# Patient Record
Sex: Female | Born: 1935 | Race: White | Hispanic: No | Smoking: Former smoker
Health system: Southern US, Community
[De-identification: ages and names within clinical notes are randomized; demographics above are authoritative.]

## PROBLEM LIST (undated history)

## (undated) DIAGNOSIS — E785 Hyperlipidemia, unspecified: Secondary | ICD-10-CM

## (undated) DIAGNOSIS — R519 Headache, unspecified: Secondary | ICD-10-CM

## (undated) DIAGNOSIS — Z83719 Family history of colon polyps, unspecified: Secondary | ICD-10-CM

## (undated) DIAGNOSIS — N200 Calculus of kidney: Secondary | ICD-10-CM

## (undated) DIAGNOSIS — B019 Varicella without complication: Secondary | ICD-10-CM

## (undated) DIAGNOSIS — K219 Gastro-esophageal reflux disease without esophagitis: Secondary | ICD-10-CM

## (undated) DIAGNOSIS — R51 Headache: Secondary | ICD-10-CM

## (undated) DIAGNOSIS — M4846XA Fatigue fracture of vertebra, lumbar region, initial encounter for fracture: Secondary | ICD-10-CM

## (undated) DIAGNOSIS — T7840XA Allergy, unspecified, initial encounter: Secondary | ICD-10-CM

## (undated) DIAGNOSIS — R002 Palpitations: Secondary | ICD-10-CM

## (undated) DIAGNOSIS — F419 Anxiety disorder, unspecified: Secondary | ICD-10-CM

## (undated) DIAGNOSIS — R011 Cardiac murmur, unspecified: Secondary | ICD-10-CM

## (undated) DIAGNOSIS — F329 Major depressive disorder, single episode, unspecified: Secondary | ICD-10-CM

## (undated) DIAGNOSIS — M1990 Unspecified osteoarthritis, unspecified site: Secondary | ICD-10-CM

## (undated) DIAGNOSIS — I10 Essential (primary) hypertension: Secondary | ICD-10-CM

## (undated) DIAGNOSIS — F32A Depression, unspecified: Secondary | ICD-10-CM

## (undated) DIAGNOSIS — B029 Zoster without complications: Secondary | ICD-10-CM

## (undated) DIAGNOSIS — Z8371 Family history of colonic polyps: Secondary | ICD-10-CM

## (undated) DIAGNOSIS — Z87442 Personal history of urinary calculi: Secondary | ICD-10-CM

## (undated) HISTORY — DX: Major depressive disorder, single episode, unspecified: F32.9

## (undated) HISTORY — DX: Varicella without complication: B01.9

## (undated) HISTORY — PX: BREAST SURGERY: SHX581

## (undated) HISTORY — DX: Family history of colonic polyps: Z83.71

## (undated) HISTORY — DX: Headache, unspecified: R51.9

## (undated) HISTORY — DX: Hyperlipidemia, unspecified: E78.5

## (undated) HISTORY — DX: Unspecified osteoarthritis, unspecified site: M19.90

## (undated) HISTORY — PX: EYE SURGERY: SHX253

## (undated) HISTORY — DX: Gastro-esophageal reflux disease without esophagitis: K21.9

## (undated) HISTORY — DX: Cardiac murmur, unspecified: R01.1

## (undated) HISTORY — DX: Zoster without complications: B02.9

## (undated) HISTORY — DX: Calculus of kidney: N20.0

## (undated) HISTORY — DX: Palpitations: R00.2

## (undated) HISTORY — DX: Essential (primary) hypertension: I10

## (undated) HISTORY — DX: Family history of colon polyps, unspecified: Z83.719

## (undated) HISTORY — PX: RHINOPLASTY: SHX2354

## (undated) HISTORY — DX: Depression, unspecified: F32.A

## (undated) HISTORY — DX: Headache: R51

## (undated) HISTORY — DX: Allergy, unspecified, initial encounter: T78.40XA

---

## 1999-08-12 HISTORY — PX: BREAST CYST ASPIRATION: SHX578

## 1999-08-12 HISTORY — PX: OTHER SURGICAL HISTORY: SHX169

## 2004-05-16 ENCOUNTER — Ambulatory Visit: Payer: Self-pay | Admitting: Internal Medicine

## 2004-10-14 ENCOUNTER — Ambulatory Visit: Payer: Self-pay | Admitting: Internal Medicine

## 2005-08-07 ENCOUNTER — Ambulatory Visit: Payer: Self-pay | Admitting: Internal Medicine

## 2005-08-11 HISTORY — PX: CATARACT EXTRACTION, BILATERAL: SHX1313

## 2005-10-02 ENCOUNTER — Ambulatory Visit: Payer: Self-pay | Admitting: Gastroenterology

## 2005-10-16 ENCOUNTER — Ambulatory Visit: Payer: Self-pay | Admitting: Gastroenterology

## 2005-11-19 ENCOUNTER — Ambulatory Visit: Payer: Self-pay | Admitting: Gastroenterology

## 2006-03-12 ENCOUNTER — Ambulatory Visit: Payer: Self-pay | Admitting: Internal Medicine

## 2006-06-09 ENCOUNTER — Ambulatory Visit: Payer: Self-pay | Admitting: Ophthalmology

## 2006-07-14 ENCOUNTER — Ambulatory Visit: Payer: Self-pay | Admitting: Ophthalmology

## 2010-08-11 DIAGNOSIS — B029 Zoster without complications: Secondary | ICD-10-CM

## 2010-08-11 HISTORY — DX: Zoster without complications: B02.9

## 2013-06-02 ENCOUNTER — Encounter: Payer: Self-pay | Admitting: Internal Medicine

## 2013-06-02 ENCOUNTER — Ambulatory Visit (INDEPENDENT_AMBULATORY_CARE_PROVIDER_SITE_OTHER): Payer: Medicare Other | Admitting: Family Medicine

## 2013-06-02 ENCOUNTER — Encounter: Payer: Self-pay | Admitting: Family Medicine

## 2013-06-02 VITALS — BP 120/74 | HR 73 | Temp 98.0°F | Ht 66.0 in | Wt 152.8 lb

## 2013-06-02 DIAGNOSIS — Z860100 Personal history of colon polyps, unspecified: Secondary | ICD-10-CM

## 2013-06-02 DIAGNOSIS — J309 Allergic rhinitis, unspecified: Secondary | ICD-10-CM

## 2013-06-02 DIAGNOSIS — R51 Headache: Secondary | ICD-10-CM

## 2013-06-02 DIAGNOSIS — R519 Headache, unspecified: Secondary | ICD-10-CM

## 2013-06-02 DIAGNOSIS — E7800 Pure hypercholesterolemia, unspecified: Secondary | ICD-10-CM

## 2013-06-02 DIAGNOSIS — IMO0002 Reserved for concepts with insufficient information to code with codable children: Secondary | ICD-10-CM

## 2013-06-02 DIAGNOSIS — I059 Rheumatic mitral valve disease, unspecified: Secondary | ICD-10-CM

## 2013-06-02 DIAGNOSIS — Z23 Encounter for immunization: Secondary | ICD-10-CM

## 2013-06-02 DIAGNOSIS — Z1231 Encounter for screening mammogram for malignant neoplasm of breast: Secondary | ICD-10-CM

## 2013-06-02 DIAGNOSIS — I10 Essential (primary) hypertension: Secondary | ICD-10-CM

## 2013-06-02 DIAGNOSIS — F325 Major depressive disorder, single episode, in full remission: Secondary | ICD-10-CM

## 2013-06-02 DIAGNOSIS — R002 Palpitations: Secondary | ICD-10-CM

## 2013-06-02 DIAGNOSIS — M810 Age-related osteoporosis without current pathological fracture: Secondary | ICD-10-CM

## 2013-06-02 DIAGNOSIS — I341 Nonrheumatic mitral (valve) prolapse: Secondary | ICD-10-CM

## 2013-06-02 DIAGNOSIS — K219 Gastro-esophageal reflux disease without esophagitis: Secondary | ICD-10-CM | POA: Insufficient documentation

## 2013-06-02 DIAGNOSIS — B0229 Other postherpetic nervous system involvement: Secondary | ICD-10-CM

## 2013-06-02 MED ORDER — AMLODIPINE BESYLATE 2.5 MG PO TABS: 2.5000 mg | ORAL_TABLET | ORAL | Status: DC

## 2013-06-02 NOTE — Assessment & Plan Note (Signed)
Due for repeat colonoscopy given high risk polyps.

## 2013-06-02 NOTE — Assessment & Plan Note (Signed)
Minimally bothering her.. ? Tension vs ice pick headaches.  No neuro changes.  if worsening will eval further.

## 2013-06-02 NOTE — Patient Instructions (Addendum)
Change amlodipine to 2.5 mg daily as opposed to 5 mg every other day.  Follow BP at home.  Goal < 140/90.  Stop at front desk to set up referral  For colonoscopy and mammogram.  Given flu vaccine today. Oush fluids.  Make appt if headaches are worsening.

## 2013-06-02 NOTE — Assessment & Plan Note (Signed)
Well controlled last check. 

## 2013-06-02 NOTE — Progress Notes (Signed)
Subjective:    Patient ID: Marilyn Sharp, female    DOB: [birth year], 77 y.o.   MRN: [id number]  HPI  77 year old female presents to establish.  She was previously living in [place]. Had MD there...  Dr. Lona Kettle Last CPX in May 2014. Had labs done at that time. See below.  She is overdue for mammogram and colonoscopy (  Right sided flat polyps on last one 08/2011..recommended repeat in 1-2 years.) DEXA: 2013 normal. Was on fosamax and boniva for several years hx of osteoporosis. DVE and PAP not indicated. No family history of uterine or ovarian cancer.   Hypertension: well controlled on losartan and amlodipine ( using every toher day given had lower BPs over summer)  Using medication without problems or lightheadedness:  None Chest pain with exertion:None Edema:None Short of breath:None Average home BPs: occ checking Other issues:  Elevated Cholesterol: Well controlled on no medication, does take fish oil. 01/2013: total  181, trig 131, HDL 67, LDL 88 Diet compliance: Exercise: Other complaints:  01/2013 Hga1c 5.8 TSH 1.250    She has new onset head pain ... On top part of head last few seconds.Marland Kitchenoccasionally happens 4-5 times a day, but only few days in a month.  She has not had any episodes in last week. No neuro changes, no vision change.  NO LOC, no fall , no head injury.    Review of Systems  Constitutional: Negative for fever, fatigue and unexpected weight change.  HENT: Negative for congestion, ear pain, sinus pressure, sneezing, sore throat and trouble swallowing.   Eyes: Negative for pain and itching.  Respiratory: Negative for cough, shortness of breath and wheezing.   Cardiovascular: Positive for palpitations. Negative for chest pain and leg swelling.       Out of breath with 3-4 flights of stair climbing stairs( or ore excessive exercise) at times, associated with fatigue and being out of breath.  Seem to improve the more she does it.  Occ palpitation when  she is fatigued.  Gastrointestinal: Negative for nausea, abdominal pain, diarrhea, constipation and blood in stool.  Genitourinary: Positive for urgency. Negative for dysuria, hematuria, vaginal discharge, difficulty urinating and menstrual problem.       Minimal urge incontinence  nocturia 2-3 times a night.  Musculoskeletal: Negative for back pain.  Skin: Negative for rash.  Neurological: Negative for syncope, weakness, light-headedness, numbness and headaches.  Psychiatric/Behavioral: Negative for confusion and dysphoric mood. The patient is not nervous/anxious.        Objective:   Physical Exam  Constitutional: Vital signs are normal. She appears well-developed and well-nourished. She is cooperative.  Non-toxic appearance. She does not appear ill. No distress.  HENT:  Head: Normocephalic.  Right Ear: Hearing, tympanic membrane, external ear and ear canal normal.  Left Ear: Hearing, tympanic membrane, external ear and ear canal normal.  Nose: Nose normal.  Eyes: Conjunctivae, EOM and lids are normal. Pupils are equal, round, and reactive to light. Lids are everted and swept, no foreign bodies found.  Neck: Trachea normal and normal range of motion. Neck supple. Carotid bruit is not present. No mass and no thyromegaly present.  Cardiovascular: Normal rate, regular rhythm, S1 normal, S2 normal, normal heart sounds and intact distal pulses.  Exam reveals no gallop.   No murmur heard. Pulmonary/Chest: Effort normal and breath sounds normal. No respiratory distress. She has no wheezes. She has no rhonchi. She has no rales.  Abdominal: Soft. Normal appearance and bowel sounds are normal. She  exhibits no distension, no fluid wave, no abdominal bruit and no mass. There is no hepatosplenomegaly. There is no tenderness. There is no rebound, no guarding and no CVA tenderness. No hernia.  Lymphadenopathy:    She has no cervical adenopathy.    She has no axillary adenopathy.  Neurological: She is  alert. She has normal strength. No cranial nerve deficit or sensory deficit.  Skin: Skin is warm, dry and intact. No rash noted.  Psychiatric: Her speech is normal and behavior is normal. Judgment normal. Her mood appears not anxious. Cognition and memory are normal. She does not exhibit a depressed mood.          Assessment & Plan:

## 2013-06-02 NOTE — Assessment & Plan Note (Signed)
Well controlled. Continue current medication. Encouraged exercise, weight loss, healthy eating habits.  

## 2013-06-21 ENCOUNTER — Ambulatory Visit: Payer: Self-pay | Admitting: Family Medicine

## 2013-06-22 ENCOUNTER — Encounter: Payer: Self-pay | Admitting: Family Medicine

## 2013-08-19 ENCOUNTER — Telehealth: Payer: Self-pay | Admitting: *Deleted

## 2013-08-19 ENCOUNTER — Ambulatory Visit (AMBULATORY_SURGERY_CENTER): Payer: Medicare Other | Admitting: *Deleted

## 2013-08-19 VITALS — Ht 66.0 in | Wt 156.6 lb

## 2013-08-19 DIAGNOSIS — Z86010 Personal history of colonic polyps: Secondary | ICD-10-CM

## 2013-08-19 MED ORDER — MOVIPREP 100 G PO SOLR: ORAL | Status: DC

## 2013-08-19 NOTE — Telephone Encounter (Signed)
Dr Hilarie Fredrickson: pt scheduled for colonoscopy Friday 1/23. Pt has had quite a few colonoscopies for hx colon polyps and does not have those reports.  Last colonoscopy 2013 in [place].  Report and pathology report are in EPIC under procedures.  Is pt due for recall colonoscopy now?  Thanks, Juliann Pulse

## 2013-08-19 NOTE — Progress Notes (Signed)
No allergies to eggs or soy. No problems with anesthesia.  

## 2013-08-22 NOTE — Telephone Encounter (Signed)
Yes, pt is due for repeat surveillance colonoscopy now She should be advised to keep her scheduled procedure for later this month Thanks  JMP

## 2013-08-22 NOTE — Telephone Encounter (Signed)
Patient was notified of Dr.Pyrtle's request to keep appointment. Patient verbally understands. Thanks,RM

## 2013-09-02 ENCOUNTER — Ambulatory Visit (AMBULATORY_SURGERY_CENTER): Payer: Medicare Other | Admitting: Internal Medicine

## 2013-09-02 ENCOUNTER — Encounter: Payer: Self-pay | Admitting: Internal Medicine

## 2013-09-02 ENCOUNTER — Encounter: Payer: Medicare Other | Admitting: Internal Medicine

## 2013-09-02 VITALS — BP 139/84 | HR 67 | Temp 97.1°F | Resp 27 | Ht 66.0 in | Wt 156.0 lb

## 2013-09-02 DIAGNOSIS — Z86010 Personal history of colonic polyps: Secondary | ICD-10-CM

## 2013-09-02 DIAGNOSIS — D126 Benign neoplasm of colon, unspecified: Secondary | ICD-10-CM

## 2013-09-02 MED ORDER — SODIUM CHLORIDE 0.9 % IV SOLN: 500.0000 mL | INTRAVENOUS | Status: DC

## 2013-09-02 NOTE — Op Note (Signed)
[facility]  [address] [place], [address]   COLONOSCOPY PROCEDURE REPORT  PATIENT: Marilyn, Sharp  MR#: [id number] BIRTHDATE: 1935-12-12 , 77  yrs. old GENDER: Female ENDOSCOPIST: Jerene Bears, MD REFERRED BY:Amy Cletis Athens, M.D. PROCEDURE DATE:  09/02/2013 PROCEDURE:   Colonoscopy with snare polypectomy First Screening Colonoscopy - Avg.  risk and is 50 yrs.  old or older - No.  Prior Negative Screening - Now for repeat screening. N/A  History of Adenoma - Now for follow-up colonoscopy & has been > or = to 3 yrs.  No.  It has been less than 3 yrs since last colonoscopy.  Medical reason.  Polyps Removed Today? Yes. ASA CLASS:   Class III INDICATIONS:Patient's personal history of adenomatous colon polyps.  MEDICATIONS: MAC sedation, administered by CRNA and Propofol (Diprivan) 230 mg IV  DESCRIPTION OF PROCEDURE:   After the risks benefits and alternatives of the procedure were thoroughly explained, informed consent was obtained.  A digital rectal exam revealed no rectal mass.   The LB [id number]-H190 [id number]  endoscope was introduced through the anus and advanced to the cecum, which was identified by both the appendix and ileocecal valve. No adverse events experienced. The quality of the prep was good, using MoviPrep  The instrument was then slowly withdrawn as the colon was fully examined.  COLON FINDINGS: A flat polyp measuring 8-10 mm in size with a mucous cap was found in the transverse colon.  A polypectomy was performed with a cold snare and with cold forceps.  The resection was complete and the polyp tissue was completely retrieved.   A sessile polyp measuring 6-7 mm in size was found in the sigmoid colon.  A polypectomy was performed with a cold snare.  The resection was complete and the polyp tissue was completely retrieved.   Mild diverticulosis was noted The finding was in the left colon. Retroflexed views revealed small external hemorrhoids. The time  to cecum=3 minutes 40 seconds.  Withdrawal time=19 minutes 55 seconds. The scope was withdrawn and the procedure completed.  COMPLICATIONS: There were no complications.  ENDOSCOPIC IMPRESSION: 1.   Flat polyp measuring 8-10 mm in size was found in the transverse colon; polypectomy was performed with a cold snare and with cold forceps 2.   Sessile polyp measuring 6-7 mm in size was found in the sigmoid colon; polypectomy was performed with a cold snare 3.   Mild diverticulosis was noted in the left colon  RECOMMENDATIONS: 1.  Hold aspirin, aspirin products, and anti-inflammatory medication for 1 week. 2.  Await pathology results 3.  High fiber diet 4.  Timing of repeat colonoscopy will be determined by pathology findings. 5.  You will receive a letter within 1-2 weeks with the results of your biopsy as well as final recommendations.  Please call my office if you have not received a letter after 3 weeks.   eSigned:  Jerene Bears, MD 09/02/2013 9:58 AM   cc: The Patient and Jinny Sanders, MD   PATIENT NAMEOrean, Sharp MR#: [id number]

## 2013-09-02 NOTE — Patient Instructions (Addendum)

## 2013-09-02 NOTE — Progress Notes (Signed)
Procedure ends, to recovery, report given and VSS. 

## 2013-09-02 NOTE — Progress Notes (Signed)
Called to room to assist during endoscopic procedure.  Patient ID and intended procedure confirmed with present staff. Received instructions for my participation in the procedure from the performing physician.  

## 2013-09-05 ENCOUNTER — Telehealth: Payer: Self-pay | Admitting: *Deleted

## 2013-09-05 NOTE — Telephone Encounter (Signed)
  Follow up Call-  Call back number 09/02/2013  Post procedure Call Back phone  # [phone]  Permission to leave phone message Yes     Patient questions:  Do you have a fever, pain , or abdominal swelling? no Pain Score  0 *  Have you tolerated food without any problems? yes  Have you been able to return to your normal activities? yes  Do you have any questions about your discharge instructions: Diet   no Medications  no Follow up visit  no  Do you have questions or concerns about your Care? no  Actions: * If pain score is 4 or above: No action needed, pain <4.

## 2013-09-06 ENCOUNTER — Encounter: Payer: Self-pay | Admitting: Internal Medicine

## 2013-10-13 ENCOUNTER — Encounter: Payer: Self-pay | Admitting: Family Medicine

## 2013-10-13 ENCOUNTER — Ambulatory Visit (INDEPENDENT_AMBULATORY_CARE_PROVIDER_SITE_OTHER): Payer: Medicare Other | Admitting: Family Medicine

## 2013-10-13 VITALS — BP 130/78 | HR 74 | Temp 98.6°F | Wt 154.0 lb

## 2013-10-13 DIAGNOSIS — H5710 Ocular pain, unspecified eye: Secondary | ICD-10-CM

## 2013-10-13 DIAGNOSIS — M25519 Pain in unspecified shoulder: Secondary | ICD-10-CM

## 2013-10-13 DIAGNOSIS — H5711 Ocular pain, right eye: Secondary | ICD-10-CM

## 2013-10-13 DIAGNOSIS — M25511 Pain in right shoulder: Secondary | ICD-10-CM

## 2013-10-13 NOTE — Assessment & Plan Note (Signed)
Resolved now. ? Secondary to eyelid issue.  Nml neuro exam. Pt reassured, continue to follow.

## 2013-10-13 NOTE — Assessment & Plan Note (Signed)
Not consistent with shingles pain. Mre consistent with Rotator cuff issue. Treat with NSAIDs, ROM exercises.  Follow up in 2 weeks if not better.

## 2013-10-13 NOTE — Patient Instructions (Signed)
Start meloxicam for pain and inflammation in right shoulder, start daily for 3-4 days then go to as needed. Start home PT exercise per handout. Follow up in 2 weeks. Call if symptoms changing or worsening.

## 2013-10-13 NOTE — Progress Notes (Signed)
Pre visit review using our clinic review tool, if applicable. No additional management support is needed unless otherwise documented below in the visit note. 

## 2013-10-13 NOTE — Progress Notes (Signed)
Subjective:    Patient ID: Marilyn Sharp, female    DOB: 11/28/1935, 78 y.o.   MRN: [id number]  Shoulder Pain  Pertinent negatives include no fever.    78 year old female presents with  new onset  right shoulder and chest wall pain.  Pain off and on since 08/2013.Pain is laterally across outer shoulder but  Radiates down arm some and below arm on chest wall. Pain with reaching across chest  and raising arm arm up.  ASA helps minimally with pain.  Occ slight rash noted, itchy.   No known injury or fall.  2 days ago noted pain in right eye ( when pressed) and  Slightly droopy lid... Resolved after 48 hours.  No rash, eyelid changes. No URI symptoms.  No weakness, no slurred speech.    She has had shingles several times in the last 5 years on right arm. Never had blisters.   She has had shingles shot.  Review of Systems  Constitutional: Negative for fever and fatigue.  HENT: Negative for ear pain.   Eyes: Positive for pain. Negative for photophobia, discharge, redness, itching and visual disturbance.  Respiratory: Negative for cough and shortness of breath.   Cardiovascular: Negative for chest pain, palpitations and leg swelling.  Gastrointestinal: Negative for abdominal pain.       Objective:   Physical Exam  Constitutional: She is oriented to person, place, and time. Vital signs are normal. She appears well-developed and well-nourished. She is cooperative.  Non-toxic appearance. She does not appear ill. No distress.  HENT:  Head: Normocephalic.  Right Ear: Hearing, tympanic membrane, external ear and ear canal normal. Tympanic membrane is not erythematous, not retracted and not bulging.  Left Ear: Hearing, tympanic membrane, external ear and ear canal normal. Tympanic membrane is not erythematous, not retracted and not bulging.  Nose: No mucosal edema or rhinorrhea. Right sinus exhibits no maxillary sinus tenderness and no frontal sinus tenderness. Left sinus exhibits no  maxillary sinus tenderness and no frontal sinus tenderness.  Mouth/Throat: Uvula is midline, oropharynx is clear and moist and mucous membranes are normal.  Eyes: Conjunctivae, EOM and lids are normal. Pupils are equal, round, and reactive to light. Lids are everted and swept, no foreign bodies found.  Neck: Trachea normal and normal range of motion. Neck supple. Carotid bruit is not present. No mass and no thyromegaly present.  Cardiovascular: Normal rate, regular rhythm, S1 normal, S2 normal, normal heart sounds, intact distal pulses and normal pulses.  Exam reveals no gallop and no friction rub.   No murmur heard. Pulmonary/Chest: Effort normal and breath sounds normal. Not tachypneic. No respiratory distress. She has no decreased breath sounds. She has no wheezes. She has no rhonchi. She has no rales.  Abdominal: Soft. Normal appearance and bowel sounds are normal. There is no tenderness.  Musculoskeletal:       Right shoulder: She exhibits decreased range of motion and tenderness. She exhibits no bony tenderness, no swelling, no deformity, no laceration, no pain, no spasm and normal strength.  Pain with abduction, neg drop arm Neg Neer's   Neurological: She is alert and oriented to person, place, and time. She has normal strength and normal reflexes. No cranial nerve deficit or sensory deficit. She exhibits normal muscle tone. She displays a negative Romberg sign. Coordination and gait normal. GCS eye subscore is 4. GCS verbal subscore is 5. GCS motor subscore is 6.  Nml cerebellar exam   No papilledema  Skin: Skin is  warm, dry and intact. No rash noted.  Psychiatric: She has a normal mood and affect. Her speech is normal and behavior is normal. Judgment and thought content normal. Her mood appears not anxious. Cognition and memory are normal. Cognition and memory are not impaired. She does not exhibit a depressed mood. She exhibits normal recent memory and normal remote memory.            Assessment & Plan:

## 2013-10-14 ENCOUNTER — Telehealth: Payer: Self-pay

## 2013-10-14 MED ORDER — MELOXICAM 15 MG PO TABS: 15.0000 mg | ORAL_TABLET | Freq: Every day | ORAL | Status: DC

## 2013-10-14 NOTE — Telephone Encounter (Signed)
Pt was seen 10/13/13  Meloxicam is not at [facility]; pt request cb when Meloxicam sent to pharmacy.Please advise.

## 2013-10-14 NOTE — Telephone Encounter (Signed)
Please let pt know Rx has been sent in. Apologize for my error please. Thanks.

## 2013-10-14 NOTE — Telephone Encounter (Signed)
Gregary Signs notified prescription has been sent to her pharmacy.

## 2013-10-25 ENCOUNTER — Encounter: Payer: Self-pay | Admitting: Family Medicine

## 2013-10-25 ENCOUNTER — Ambulatory Visit (INDEPENDENT_AMBULATORY_CARE_PROVIDER_SITE_OTHER)
Admission: RE | Admit: 2013-10-25 | Discharge: 2013-10-25 | Disposition: A | Discharge status: Home / Self Care | Payer: Medicare Other | Source: Ambulatory Visit | Attending: Family Medicine | Admitting: Family Medicine

## 2013-10-25 ENCOUNTER — Ambulatory Visit (INDEPENDENT_AMBULATORY_CARE_PROVIDER_SITE_OTHER): Payer: Medicare Other | Admitting: Family Medicine

## 2013-10-25 VITALS — BP 130/80 | HR 74 | Temp 97.8°F | Ht 66.0 in | Wt 154.2 lb

## 2013-10-25 DIAGNOSIS — M25519 Pain in unspecified shoulder: Secondary | ICD-10-CM

## 2013-10-25 DIAGNOSIS — M25511 Pain in right shoulder: Secondary | ICD-10-CM

## 2013-10-25 NOTE — Progress Notes (Signed)
Subjective:    Patient ID: Marilyn Sharp, female    DOB: April 30, 1936, 78 y.o.   MRN: [id number]  HPI 78 year old female presents for follow up right shoulder pain. At last OV.. Most consistent with Rotator cuff issue. Started on meloxicam daily prn pain.   Today she reports she continues to have pain of and on.  Pain is not as severe as it was.  Pain in lateral arm and occ runs to wrist. No numbness.  Pain with lifting arm up and over to left side. She feels she is weaker in right hand.. Cannot open paint tubes.  No new neck pain . No pain with moving neck.  No further eye symptoms.  No known injury.    Review of Systems  Constitutional: Negative for chills and fatigue.  HENT: Negative for ear pain.   Eyes: Negative for pain.  Respiratory: Negative for cough and shortness of breath.   Cardiovascular: Negative for chest pain.       Objective:   Physical Exam  Constitutional: She is oriented to person, place, and time. Vital signs are normal. She appears well-developed and well-nourished. She is cooperative.  Non-toxic appearance. She does not appear ill. No distress.  HENT:  Head: Normocephalic.  Right Ear: Hearing, tympanic membrane, external ear and ear canal normal. Tympanic membrane is not erythematous, not retracted and not bulging.  Left Ear: Hearing, tympanic membrane, external ear and ear canal normal. Tympanic membrane is not erythematous, not retracted and not bulging.  Nose: No mucosal edema or rhinorrhea. Right sinus exhibits no maxillary sinus tenderness and no frontal sinus tenderness. Left sinus exhibits no maxillary sinus tenderness and no frontal sinus tenderness.  Mouth/Throat: Uvula is midline, oropharynx is clear and moist and mucous membranes are normal.  Eyes: Conjunctivae, EOM and lids are normal. Pupils are equal, round, and reactive to light. Lids are everted and swept, no foreign bodies found.  Neck: Trachea normal and normal range of motion. Neck  supple. Carotid bruit is not present. No mass and no thyromegaly present.  Cardiovascular: Normal rate, regular rhythm, S1 normal, S2 normal, normal heart sounds, intact distal pulses and normal pulses.  Exam reveals no gallop and no friction rub.   No murmur heard. Pulmonary/Chest: Effort normal and breath sounds normal. Not tachypneic. No respiratory distress. She has no decreased breath sounds. She has no wheezes. She has no rhonchi. She has no rales.  Abdominal: Soft. Normal appearance and bowel sounds are normal. There is no tenderness.  Musculoskeletal:       Right shoulder: She exhibits decreased range of motion and tenderness. She exhibits no bony tenderness, no swelling, no deformity, no laceration, no pain, no spasm and normal strength.  Slight right sided atrophy in right thenar prominence. Pain with crossing arm over chest Neg drop arm Neg Neer's No pain with int and ext rotation ttp over ant and posterior sub acromial fossa.   Neurological: She is alert and oriented to person, place, and time. She has normal strength and normal reflexes. No cranial nerve deficit or sensory deficit. She exhibits normal muscle tone. She displays a negative Romberg sign. Coordination and gait normal. GCS eye subscore is 4. GCS verbal subscore is 5. GCS motor subscore is 6.  Nml cerebellar exam   No papilledema  Skin: Skin is warm, dry and intact. No rash noted.  Psychiatric: She has a normal mood and affect. Her speech is normal and behavior is normal. Judgment and thought content normal. Her  mood appears not anxious. Cognition and memory are normal. Cognition and memory are not impaired. She does not exhibit a depressed mood. She exhibits normal recent memory and normal remote memory.          Assessment & Plan:

## 2013-10-25 NOTE — Progress Notes (Signed)
Pre visit review using our clinic review tool, if applicable. No additional management support is needed unless otherwise documented below in the visit note. 

## 2013-10-25 NOTE — Assessment & Plan Note (Signed)
Pain has some qualities of rotator cuff but also concerning for cervical neck pathology given weakness in right hand, possible atrophy and radiation to right hand.  Will eval neck and shoulder with X-rays.  USe meloxicam prn.

## 2013-10-25 NOTE — Patient Instructions (Signed)
We will call with X-ray results.  Use meloxicam as needed for pain.

## 2013-11-23 ENCOUNTER — Telehealth: Payer: Self-pay | Admitting: Family Medicine

## 2013-11-23 DIAGNOSIS — M810 Age-related osteoporosis without current pathological fracture: Secondary | ICD-10-CM

## 2013-11-23 DIAGNOSIS — E7800 Pure hypercholesterolemia, unspecified: Secondary | ICD-10-CM

## 2013-11-23 NOTE — Telephone Encounter (Signed)
Message copied by Jinny Sanders on Wed Nov 23, 2013 11:56 PM ------      Message from: Ellamae Sia      Created: Thu Nov 10, 2013 12:26 PM      Regarding: Lab orders for Thursday, 4.16.15       Patient is scheduled for CPX labs, please order future labs, Thanks , Terri       ------

## 2013-11-24 ENCOUNTER — Other Ambulatory Visit (INDEPENDENT_AMBULATORY_CARE_PROVIDER_SITE_OTHER): Payer: Medicare Other

## 2013-11-24 DIAGNOSIS — M810 Age-related osteoporosis without current pathological fracture: Secondary | ICD-10-CM

## 2013-11-24 DIAGNOSIS — E7800 Pure hypercholesterolemia, unspecified: Secondary | ICD-10-CM

## 2013-11-24 LAB — LIPID PANEL
CHOLESTEROL: 183 mg/dL (ref 0–200)
HDL: 56.8 mg/dL (ref >39.00)
LDL Cholesterol: 108 mg/dL — ABNORMAL HIGH (ref 0–99)
Total CHOL/HDL Ratio: 3
Triglycerides: 93 mg/dL (ref 0.0–149.0)
VLDL: 18.6 mg/dL (ref 0.0–40.0)

## 2013-11-24 LAB — COMPREHENSIVE METABOLIC PANEL
ALT: 11 U/L (ref 0–35)
AST: 13 U/L (ref 0–37)
Albumin: 3.9 g/dL (ref 3.5–5.2)
Alkaline Phosphatase: 34 U/L — ABNORMAL LOW (ref 39–117)
BILIRUBIN TOTAL: 0.7 mg/dL (ref 0.3–1.2)
BUN: 19 mg/dL (ref 6–23)
CO2: 27 mEq/L (ref 19–32)
Calcium: 8.8 mg/dL (ref 8.4–10.5)
Chloride: 104 mEq/L (ref 96–112)
Creatinine, Ser: 0.8 mg/dL (ref 0.4–1.2)
GFR: 74.14 mL/min (ref >60.00)
Glucose, Bld: 88 mg/dL (ref 70–99)
Potassium: 4.1 mEq/L (ref 3.5–5.1)
SODIUM: 139 meq/L (ref 135–145)
TOTAL PROTEIN: 6.5 g/dL (ref 6.0–8.3)

## 2013-11-25 LAB — VITAMIN D 25 HYDROXY (VIT D DEFICIENCY, FRACTURES): VIT D 25 HYDROXY: 42 ng/mL (ref 30–89)

## 2013-12-01 ENCOUNTER — Ambulatory Visit (INDEPENDENT_AMBULATORY_CARE_PROVIDER_SITE_OTHER): Payer: Medicare Other | Admitting: Family Medicine

## 2013-12-01 ENCOUNTER — Encounter: Payer: Self-pay | Admitting: Family Medicine

## 2013-12-01 VITALS — BP 144/80 | HR 76 | Temp 97.7°F | Ht 65.5 in | Wt 154.8 lb

## 2013-12-01 DIAGNOSIS — Z0000 Encounter for general adult medical examination without abnormal findings: Secondary | ICD-10-CM

## 2013-12-01 DIAGNOSIS — E7800 Pure hypercholesterolemia, unspecified: Secondary | ICD-10-CM

## 2013-12-01 DIAGNOSIS — Z23 Encounter for immunization: Secondary | ICD-10-CM

## 2013-12-01 DIAGNOSIS — I10 Essential (primary) hypertension: Secondary | ICD-10-CM

## 2013-12-01 DIAGNOSIS — R42 Dizziness and giddiness: Secondary | ICD-10-CM

## 2013-12-01 NOTE — Progress Notes (Signed)
Pre visit review using our clinic review tool, if applicable. No additional management support is needed unless otherwise documented below in the visit note. 

## 2013-12-01 NOTE — Assessment & Plan Note (Signed)
Nml EKG, no exertional symptoms. Only occ after climbing stainrs.  No bruit. If continuing consider ECHo, carotid dopplers etc.

## 2013-12-01 NOTE — Progress Notes (Signed)
Subjective:    Patient ID: Marilyn Sharp, female    DOB: 10-01-1935, 78 y.o.   MRN: [id number]  HPI  I have personally reviewed the Medicare Annual Wellness questionnaire and have noted 1. The patient's medical and social history 2. Their use of alcohol, tobacco or illicit drugs 3. Their current medications and supplements 4. The patient's functional ability including ADL's, fall risks, home safety risks and hearing or visual             impairment. 5. Diet and physical activities 6. Evidence for depression or mood disorders The patients weight, height, BMI and visual acuity have been recorded in the chart I have made referrals, counseling and provided education to the patient based review of the above and I have provided the pt with a written personalized care plan for preventive services.  Hypertension:   Borderline control on losartan and amlodipine BP Readings from Last 3 Encounters:  12/01/13 144/80  10/25/13 130/80  10/13/13 130/78  Using medication without problems or lightheadedness:  After going up hill after as few seconds she feels slightly dizzy. No lightheadedness from sitting to standing, but takes her time. Chest pain with exertion:None Edema: None Short of breath:None, no PND Average home BPs: 140=143/79-80 Other issues:  Elevated Cholesterol:  LDL at goal < 130 no medication. Lab Results  Component Value Date   CHOL 183 11/24/2013   HDL 56.80 11/24/2013   LDLCALC 108* 11/24/2013   TRIG 93.0 11/24/2013   CHOLHDL 3 11/24/2013  Diet compliance: moderate Exercise: None Other complaints:  Right shoulder pain... Seen on 10/2013. Treat with meloxicam.  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  Pain is now almost all gone in last 2 weeks.  Vit D well controlled.    Review of Systems  Constitutional: Negative for fever and fatigue.  HENT: Negative for ear pain.   Eyes: Negative for pain.  Respiratory: Negative for chest tightness  and shortness of breath.   Cardiovascular: Negative for chest pain, palpitations and leg swelling.  Gastrointestinal: Negative for abdominal pain.  Genitourinary: Negative for dysuria.       Objective:   Physical Exam  Constitutional: Vital signs are normal. She appears well-developed and well-nourished. She is cooperative.  Non-toxic appearance. She does not appear ill. No distress.  HENT:  Head: Normocephalic.  Right Ear: Hearing, tympanic membrane, external ear and ear canal normal.  Left Ear: Hearing, tympanic membrane, external ear and ear canal normal.  Nose: Nose normal.  Eyes: Conjunctivae, EOM and lids are normal. Pupils are equal, round, and reactive to light. Lids are everted and swept, no foreign bodies found.  Neck: Trachea normal and normal range of motion. Neck supple. Carotid bruit is not present. No mass and no thyromegaly present.  Cardiovascular: Normal rate, regular rhythm, S1 normal, S2 normal, normal heart sounds and intact distal pulses.  Exam reveals no gallop.   No murmur heard. Pulmonary/Chest: Effort normal and breath sounds normal. No respiratory distress. She has no wheezes. She has no rhonchi. She has no rales.  Abdominal: Soft. Normal appearance and bowel sounds are normal. She exhibits no distension, no fluid wave, no abdominal bruit and no mass. There is no hepatosplenomegaly. There is no tenderness. There is no rebound, no guarding and no CVA tenderness. No hernia.  Genitourinary: No breast swelling, tenderness, discharge or bleeding.  Lymphadenopathy:    She has no cervical adenopathy.    She has no axillary adenopathy.  Neurological: She is  alert. She has normal strength. No cranial nerve deficit or sensory deficit.  Skin: Skin is warm, dry and intact. No rash noted.  Psychiatric: Her speech is normal and behavior is normal. Judgment normal. Her mood appears not anxious. Cognition and memory are normal. She does not exhibit a depressed mood.           Assessment & Plan:  The patient's preventative maintenance and recommended screening tests for an annual wellness exam were reviewed in full today. Brought up to date unless services declined.  Counselled on the importance of diet, exercise, and its role in overall health and mortality. The patient's FH and SH was reviewed, including their home life, tobacco status, and drug and alcohol status.   DVE/PAP: not indicated, no family history of uterine and ovarian cancer Colon: Dr. Hilarie Fredrickson, tubular adenoma 09/02/2013, plan repeat in likely 3 years Mammo: nml 06/2013 DEXA: osteoporosis.. Then normal in 2013 after fosamax/boniva for  5 years  Vaccines: uptodate with Td, shingles, PNA, due for prevnar. Former smoker 20 pack year history

## 2013-12-01 NOTE — Patient Instructions (Addendum)
Get back track with regular exercise. Call to schedule mammogram and bone density in 06/2014.  Call if dizziness worsening or any exertional symptoms.

## 2013-12-01 NOTE — Addendum Note (Signed)
Addended by: Carter Kitten on: 12/01/2013 03:26 PM   Modules accepted: Orders

## 2013-12-02 ENCOUNTER — Telehealth: Payer: Self-pay | Admitting: Family Medicine

## 2013-12-02 NOTE — Telephone Encounter (Signed)
Relevant patient education assigned to patient using Emmi. ° °

## 2014-05-04 ENCOUNTER — Encounter: Payer: Self-pay | Admitting: Family Medicine

## 2014-05-09 ENCOUNTER — Encounter: Payer: Self-pay | Admitting: Family Medicine

## 2014-05-09 ENCOUNTER — Ambulatory Visit (INDEPENDENT_AMBULATORY_CARE_PROVIDER_SITE_OTHER): Payer: Medicare Other | Admitting: Family Medicine

## 2014-05-09 VITALS — BP 138/92 | HR 71 | Temp 98.2°F | Ht 65.5 in | Wt 154.5 lb

## 2014-05-09 DIAGNOSIS — I10 Essential (primary) hypertension: Secondary | ICD-10-CM

## 2014-05-09 DIAGNOSIS — Z23 Encounter for immunization: Secondary | ICD-10-CM

## 2014-05-09 DIAGNOSIS — R42 Dizziness and giddiness: Secondary | ICD-10-CM

## 2014-05-09 MED ORDER — LOSARTAN POTASSIUM 100 MG PO TABS: 100.0000 mg | ORAL_TABLET | Freq: Every day | ORAL | Status: DC

## 2014-05-09 MED ORDER — LOSARTAN POTASSIUM 50 MG PO TABS: 50.0000 mg | ORAL_TABLET | Freq: Every day | ORAL | Status: DC

## 2014-05-09 MED ORDER — AMLODIPINE BESYLATE 2.5 MG PO TABS: 2.5000 mg | ORAL_TABLET | Freq: Every day | ORAL | Status: DC

## 2014-05-09 NOTE — Progress Notes (Signed)
Pre visit review using our clinic review tool, if applicable. No additional management support is needed unless otherwise documented below in the visit note. 

## 2014-05-09 NOTE — Assessment & Plan Note (Signed)
MAy be secondary to med SE. Feels better off losartan. Will eval carotid dopplers.  Labs nml in 11/2013 CMET. If not improving next OV check CBC TSH etc. Will try lower dose losartan. Close follow up.

## 2014-05-09 NOTE — Assessment & Plan Note (Signed)
Borderline control but off losartan. Feels better off losartan. Will restart but at half dose.

## 2014-05-09 NOTE — Patient Instructions (Addendum)
Try losartan 50 mg daily. Follow BP at home. Goal BP < 150/90. Continue amlodipine 2.5 mg daily for now. Stop at front desk to set up referral for carotid dopplers. Follow up in 2 weeks for BP check, bring measurements.

## 2014-05-09 NOTE — Progress Notes (Signed)
   Subjective:    Patient ID: Marilyn Sharp, female    DOB: Sep 12, 1935, 78 y.o.   MRN: [id number]  Dizziness Pertinent negatives include no abdominal pain, chest pain, fatigue or fever.  Sore Throat  Pertinent negatives include no abdominal pain, ear pain or shortness of breath.   78 year old female presents with continue dizziness when going up steps/hill. No chest pain with exertion. Mild shortness of breath.  Hypertension:  Elevated today, but has been off losartan x 2 days. Pharmacy does not have it yet, on order. She continues amlodipine. She feels much better overall off the losartan. BP Readings from Last 3 Encounters:  05/09/14 138/92  12/01/13 144/80  10/25/13 130/80  Average home BPs:not checking at home. Other issues:    Review of Systems  Constitutional: Negative for fever and fatigue.  HENT: Negative for ear pain.   Eyes: Negative for pain.  Respiratory: Negative for chest tightness and shortness of breath.   Cardiovascular: Negative for chest pain, palpitations and leg swelling.  Gastrointestinal: Negative for abdominal pain.  Genitourinary: Negative for dysuria.  Neurological: Positive for dizziness.       Objective:   Physical Exam  Constitutional: Vital signs are normal. She appears well-developed and well-nourished. She is cooperative.  Non-toxic appearance. She does not appear ill. No distress.  HENT:  Head: Normocephalic.  Right Ear: Hearing, tympanic membrane, external ear and ear canal normal. Tympanic membrane is not erythematous, not retracted and not bulging.  Left Ear: Hearing, tympanic membrane, external ear and ear canal normal. Tympanic membrane is not erythematous, not retracted and not bulging.  Nose: No mucosal edema or rhinorrhea. Right sinus exhibits no maxillary sinus tenderness and no frontal sinus tenderness. Left sinus exhibits no maxillary sinus tenderness and no frontal sinus tenderness.  Mouth/Throat: Uvula is midline, oropharynx is  clear and moist and mucous membranes are normal.  Eyes: Conjunctivae, EOM and lids are normal. Pupils are equal, round, and reactive to light. Lids are everted and swept, no foreign bodies found.  Neck: Trachea normal and normal range of motion. Neck supple. Carotid bruit is not present. No mass and no thyromegaly present.  Cardiovascular: Normal rate, regular rhythm, S1 normal, S2 normal, normal heart sounds, intact distal pulses and normal pulses.  Exam reveals no gallop and no friction rub.   No murmur heard. Pulmonary/Chest: Effort normal and breath sounds normal. Not tachypneic. No respiratory distress. She has no decreased breath sounds. She has no wheezes. She has no rhonchi. She has no rales.  Abdominal: Soft. Normal appearance and bowel sounds are normal. There is no tenderness.  Neurological: She is alert.  Skin: Skin is warm, dry and intact. No rash noted.  Psychiatric: Her speech is normal and behavior is normal. Judgment and thought content normal. Her mood appears not anxious. Cognition and memory are normal. She does not exhibit a depressed mood.          Assessment & Plan:

## 2014-05-18 ENCOUNTER — Encounter (INDEPENDENT_AMBULATORY_CARE_PROVIDER_SITE_OTHER): Payer: Medicare Other

## 2014-05-18 DIAGNOSIS — R42 Dizziness and giddiness: Secondary | ICD-10-CM

## 2014-05-30 ENCOUNTER — Encounter: Payer: Self-pay | Admitting: Family Medicine

## 2014-05-30 ENCOUNTER — Ambulatory Visit (INDEPENDENT_AMBULATORY_CARE_PROVIDER_SITE_OTHER): Payer: Medicare Other | Admitting: Family Medicine

## 2014-05-30 VITALS — BP 120/70 | HR 79 | Temp 98.6°F | Ht 65.5 in | Wt 155.5 lb

## 2014-05-30 DIAGNOSIS — I6523 Occlusion and stenosis of bilateral carotid arteries: Secondary | ICD-10-CM

## 2014-05-30 DIAGNOSIS — I6529 Occlusion and stenosis of unspecified carotid artery: Secondary | ICD-10-CM | POA: Insufficient documentation

## 2014-05-30 DIAGNOSIS — I10 Essential (primary) hypertension: Secondary | ICD-10-CM

## 2014-05-30 NOTE — Progress Notes (Signed)
   Subjective:    Patient ID: Marilyn Sharp, female    DOB: 1935-09-22, 78 y.o.   MRN: [id number]  HPI  78 year old female returns for 2 week BP check  She was restarted on 1/2 dose of losartan ( 50 mg daily) for better control of BP . Not at full dose as full dose was causing SE of dizziness. Carotid dopplers were low risk. CMET nml 11/2013 for similar issue.  Today she reports she feels much better overall. Dizziness is almost gone.  At home BP 135-145/93  No CP, no SOB, no edema. BP Readings from Last 3 Encounters:  05/30/14 120/70  05/09/14 138/92  12/01/13 144/80        Review of Systems  Constitutional: Negative for fever and fatigue.  HENT: Negative for ear pain.   Eyes: Negative for pain.  Respiratory: Negative for chest tightness and shortness of breath.   Cardiovascular: Negative for chest pain, palpitations and leg swelling.  Gastrointestinal: Negative for abdominal pain.  Genitourinary: Negative for dysuria.       Objective:   Physical Exam  Constitutional: Vital signs are normal. She appears well-developed and well-nourished. She is cooperative.  Non-toxic appearance. She does not appear ill. No distress.  HENT:  Head: Normocephalic.  Right Ear: Hearing, tympanic membrane, external ear and ear canal normal. Tympanic membrane is not erythematous, not retracted and not bulging.  Left Ear: Hearing, tympanic membrane, external ear and ear canal normal. Tympanic membrane is not erythematous, not retracted and not bulging.  Nose: No mucosal edema or rhinorrhea. Right sinus exhibits no maxillary sinus tenderness and no frontal sinus tenderness. Left sinus exhibits no maxillary sinus tenderness and no frontal sinus tenderness.  Mouth/Throat: Uvula is midline, oropharynx is clear and moist and mucous membranes are normal.  Eyes: Conjunctivae, EOM and lids are normal. Pupils are equal, round, and reactive to light. Lids are everted and swept, no foreign bodies found.    Neck: Trachea normal and normal range of motion. Neck supple. Carotid bruit is not present. No mass and no thyromegaly present.  Cardiovascular: Normal rate, regular rhythm, S1 normal, S2 normal, normal heart sounds, intact distal pulses and normal pulses.  Exam reveals no gallop and no friction rub.   No murmur heard. Pulmonary/Chest: Effort normal and breath sounds normal. Not tachypneic. No respiratory distress. She has no decreased breath sounds. She has no wheezes. She has no rhonchi. She has no rales.  Abdominal: Soft. Normal appearance and bowel sounds are normal. There is no tenderness.  Neurological: She is alert.  Skin: Skin is warm, dry and intact. No rash noted.  Psychiatric: Her speech is normal and behavior is normal. Judgment and thought content normal. Her mood appears not anxious. Cognition and memory are normal. She does not exhibit a depressed mood.          Assessment & Plan:

## 2014-05-30 NOTE — Assessment & Plan Note (Signed)
Well controlled. Continue current medication. Encouraged exercise, weight loss, healthy eating habits.  

## 2014-05-30 NOTE — Progress Notes (Signed)
Pre visit review using our clinic review tool, if applicable. No additional management support is needed unless otherwise documented below in the visit note. 

## 2014-05-30 NOTE — Patient Instructions (Signed)
Continue working on healthy lifestyle. Continue low cholesterol diet. Recheck carotid dopplers in 2 years.

## 2014-06-30 ENCOUNTER — Ambulatory Visit: Payer: Self-pay | Admitting: Family Medicine

## 2014-07-02 ENCOUNTER — Encounter: Payer: Self-pay | Admitting: Family Medicine

## 2014-07-04 ENCOUNTER — Encounter: Payer: Self-pay | Admitting: Family Medicine

## 2014-07-10 ENCOUNTER — Ambulatory Visit (INDEPENDENT_AMBULATORY_CARE_PROVIDER_SITE_OTHER): Payer: Medicare Other | Admitting: Family Medicine

## 2014-07-10 ENCOUNTER — Encounter: Payer: Self-pay | Admitting: Family Medicine

## 2014-07-10 VITALS — BP 120/72 | HR 71 | Temp 98.7°F | Ht 65.5 in | Wt 152.5 lb

## 2014-07-10 DIAGNOSIS — J208 Acute bronchitis due to other specified organisms: Secondary | ICD-10-CM

## 2014-07-10 DIAGNOSIS — J209 Acute bronchitis, unspecified: Secondary | ICD-10-CM | POA: Insufficient documentation

## 2014-07-10 DIAGNOSIS — I6523 Occlusion and stenosis of bilateral carotid arteries: Secondary | ICD-10-CM

## 2014-07-10 MED ORDER — BENZONATATE 200 MG PO CAPS: 200.0000 mg | ORAL_CAPSULE | Freq: Two times a day (BID) | ORAL | Status: DC | PRN

## 2014-07-10 MED ORDER — AZITHROMYCIN 250 MG PO TABS: ORAL_TABLET | ORAL | Status: DC

## 2014-07-10 NOTE — Patient Instructions (Signed)
Rest push fluids.  Mucinex DM or benzonate perles for cough as needed.  Complete all the antibiotics.  Go To ER if severe shortness of breath, call if not improving as expected.

## 2014-07-10 NOTE — Progress Notes (Signed)
Pre visit review using our clinic review tool, if applicable. No additional management support is needed unless otherwise documented below in the visit note. 

## 2014-07-10 NOTE — Progress Notes (Signed)
   Subjective:    Patient ID: Marilyn Sharp, female    DOB: 07-30-1936, 78 y.o.   MRN: [id number]  Cough This is a new problem. The current episode started 1 to 4 weeks ago. The cough is productive of sputum. Associated symptoms include chills, myalgias, nasal congestion and shortness of breath. Pertinent negatives include no ear congestion or sweats. Associated symptoms comments: No sinus pain. The symptoms are aggravated by lying down. Risk factors: nonsmoker. Treatments tried: robitussin DM, mucinex DM, aspirin. The treatment provided mild relief. There is no history of asthma, bronchitis, COPD, emphysema, environmental allergies or pneumonia. She has had some wheeze with illness in past but no specific asthma diagnosis      Review of Systems  Constitutional: Positive for chills.  Respiratory: Positive for shortness of breath.   Musculoskeletal: Positive for myalgias.  Allergic/Immunologic: Negative for environmental allergies.       Objective:   Physical Exam  Constitutional: Vital signs are normal. She appears well-developed and well-nourished. She is cooperative.  Non-toxic appearance. She does not appear ill. No distress.  HENT:  Head: Normocephalic.  Right Ear: Hearing, tympanic membrane, external ear and ear canal normal. Tympanic membrane is not erythematous, not retracted and not bulging.  Left Ear: Hearing, tympanic membrane, external ear and ear canal normal. Tympanic membrane is not erythematous, not retracted and not bulging.  Nose: Mucosal edema and rhinorrhea present. Right sinus exhibits no maxillary sinus tenderness and no frontal sinus tenderness. Left sinus exhibits no maxillary sinus tenderness and no frontal sinus tenderness.  Mouth/Throat: Uvula is midline, oropharynx is clear and moist and mucous membranes are normal.  Eyes: Conjunctivae, EOM and lids are normal. Pupils are equal, round, and reactive to light. Lids are everted and swept, no foreign bodies found.    Neck: Trachea normal and normal range of motion. Neck supple. Carotid bruit is not present. No thyroid mass and no thyromegaly present.  Cardiovascular: Normal rate, regular rhythm, S1 normal, S2 normal, normal heart sounds, intact distal pulses and normal pulses.  Exam reveals no gallop and no friction rub.   No murmur heard. Pulmonary/Chest: Effort normal and breath sounds normal. No tachypnea. No respiratory distress. She has no decreased breath sounds. She has no wheezes. She has no rhonchi. She has no rales.  Neurological: She is alert.  Skin: Skin is warm, dry and intact. No rash noted.  Psychiatric: Her speech is normal and behavior is normal. Judgment normal. Her mood appears not anxious. Cognition and memory are normal. She does not exhibit a depressed mood.          Assessment & Plan:

## 2014-10-30 ENCOUNTER — Other Ambulatory Visit: Payer: Self-pay | Admitting: Family Medicine

## 2014-10-30 NOTE — Telephone Encounter (Signed)
Please call and schedule Medicare Wellness with fasting labs prior for the end of April or first of May with Dr. Diona Browner.

## 2014-11-02 NOTE — Telephone Encounter (Signed)
Spoke to pt; she will call back and schedule when she gets home

## 2014-11-26 ENCOUNTER — Other Ambulatory Visit: Payer: Self-pay | Admitting: Family Medicine

## 2015-02-16 ENCOUNTER — Encounter: Payer: Self-pay | Admitting: Family Medicine

## 2015-02-16 ENCOUNTER — Ambulatory Visit (INDEPENDENT_AMBULATORY_CARE_PROVIDER_SITE_OTHER): Payer: Medicare Other | Admitting: Family Medicine

## 2015-02-16 VITALS — BP 130/72 | HR 64 | Temp 98.4°F | Ht 65.5 in | Wt 149.5 lb

## 2015-02-16 DIAGNOSIS — E7800 Pure hypercholesterolemia, unspecified: Secondary | ICD-10-CM

## 2015-02-16 DIAGNOSIS — I10 Essential (primary) hypertension: Secondary | ICD-10-CM | POA: Diagnosis not present

## 2015-02-16 DIAGNOSIS — I6523 Occlusion and stenosis of bilateral carotid arteries: Secondary | ICD-10-CM

## 2015-02-16 DIAGNOSIS — Z0000 Encounter for general adult medical examination without abnormal findings: Secondary | ICD-10-CM

## 2015-02-16 DIAGNOSIS — R42 Dizziness and giddiness: Secondary | ICD-10-CM | POA: Diagnosis not present

## 2015-02-16 DIAGNOSIS — Z7189 Other specified counseling: Secondary | ICD-10-CM

## 2015-02-16 LAB — COMPREHENSIVE METABOLIC PANEL
ALBUMIN: 4 g/dL (ref 3.5–5.2)
ALT: 13 U/L (ref 0–35)
AST: 14 U/L (ref 0–37)
Alkaline Phosphatase: 40 U/L (ref 39–117)
BUN: 22 mg/dL (ref 6–23)
CALCIUM: 8.7 mg/dL (ref 8.4–10.5)
CHLORIDE: 105 meq/L (ref 96–112)
CO2: 28 meq/L (ref 19–32)
CREATININE: 0.79 mg/dL (ref 0.50–1.10)
Glucose, Bld: 86 mg/dL (ref 70–99)
POTASSIUM: 4.3 meq/L (ref 3.5–5.3)
Sodium: 142 mEq/L (ref 135–145)
Total Bilirubin: 0.7 mg/dL (ref 0.2–1.2)
Total Protein: 6.2 g/dL (ref 6.0–8.3)

## 2015-02-16 LAB — LIPID PANEL
Cholesterol: 163 mg/dL (ref 0–200)
HDL: 56 mg/dL (ref >=46)
LDL CALC: 88 mg/dL (ref 0–99)
Total CHOL/HDL Ratio: 2.9 Ratio
Triglycerides: 94 mg/dL (ref <150)
VLDL: 19 mg/dL (ref 0–40)

## 2015-02-16 LAB — VITAMIN B12: Vitamin B-12: 494 pg/mL (ref 211–911)

## 2015-02-16 LAB — TSH: TSH: 1.237 u[IU]/mL (ref 0.350–4.500)

## 2015-02-16 NOTE — Addendum Note (Signed)
Addended by: Marchia Bond on: 02/16/2015 04:27 PM   Modules accepted: Miquel Dunn

## 2015-02-16 NOTE — Progress Notes (Signed)
Pre visit review using our clinic review tool, if applicable. No additional management support is needed unless otherwise documented below in the visit note. 

## 2015-02-16 NOTE — Progress Notes (Signed)
I have personally reviewed the Medicare Annual Wellness questionnaire and have noted 1. The patient's medical and social history 2. Their use of alcohol, tobacco or illicit drugs 3. Their current medications and supplements 4. The patient's functional ability including ADL's, fall risks, home safety risks and hearing or visual             impairment. 5. Diet and physical activities 6. Evidence for depression or mood disorders 7.         Updated provider list Cognitive evaluation was performed and recorded on pt medicare questionnaire form. The patients weight, height, BMI and visual acuity have been recorded in the chart  I have made referrals, counseling and provided education to the patient based review of the above and I have provided the pt with a written personalized care plan for preventive services.   Hypertension:  Good control on losartan and amlodipine BP Readings from Last 3 Encounters:  02/16/15 130/72  07/10/14 120/72  05/30/14 120/70   Using medication without problems or lightheadedness:  She is continuing to have dizziness, several times a day. Not room spinning. She has tried drinking more water. Only happens when she bends over or walking up flight of steps. Ongoing on for 1 year, much worse in last 3 months. Chest pain with exertion:None Edema: None Short of breath:None, no PND Average home BPs: 140/70 Other issues:  Elevated Cholesterol: LDL at goal < 130 no medication at last check 2015.. Due for re-eval. Lab Results  Component Value Date   CHOL 183 11/24/2013   HDL 56.80 11/24/2013   LDLCALC 108* 11/24/2013   TRIG 93.0 11/24/2013   CHOLHDL 3 11/24/2013  Diet compliance: good Exercise: None,walks dogs, walk slow and short walks Other complaints:   Social History /Family History/Past Medical History reviewed and updated if needed.   Review of Systems  Constitutional: Negative for fever and fatigue.  HENT: Negative for ear pain.  Eyes: Negative  for pain.  Respiratory: Negative for chest tightness and shortness of breath.  Cardiovascular: Negative for chest pain, palpitations and leg swelling.  Gastrointestinal: Negative for abdominal pain.  Genitourinary: Negative for dysuria.  She has some urinary urgency.. Not interested in using a medication.      Objective:   Physical Exam  Constitutional: Vital signs are normal. She appears well-developed and well-nourished. She is cooperative. Non-toxic appearance. She does not appear ill. No distress.  HENT:  Head: Normocephalic.  Right Ear: Hearing, tympanic membrane, external ear and ear canal normal.  Left Ear: Hearing, tympanic membrane, external ear and ear canal normal.  Nose: Nose normal.  Eyes: Conjunctivae, EOM and lids are normal. Pupils are equal, round, and reactive to light. Lids are everted and swept, no foreign bodies found.  Neck: Trachea normal and normal range of motion. Neck supple. Carotid bruit is not present. No mass and no thyromegaly present.  Cardiovascular: Normal rate, regular rhythm, S1 normal, S2 normal, normal heart sounds and intact distal pulses. Exam reveals no gallop.  No murmur heard. Pulmonary/Chest: Effort normal and breath sounds normal. No respiratory distress. She has no wheezes. She has no rhonchi. She has no rales.  Abdominal: Soft. Normal appearance and bowel sounds are normal. She exhibits no distension, no fluid wave, no abdominal bruit and no mass. There is no hepatosplenomegaly. There is no tenderness. There is no rebound, no guarding and no CVA tenderness. No hernia.  Genitourinary: No breast swelling, tenderness, discharge or bleeding.  Lymphadenopathy:   She has no cervical adenopathy.  She has no axillary adenopathy.  Neurological: She is alert. She has normal strength. No cranial nerve deficit or sensory deficit.  Skin: Skin is warm, dry and intact. No rash noted.  Psychiatric: Her speech is normal and behavior is normal.  Judgment normal. Her mood appears not anxious. Cognition and memory are normal. She does not exhibit a depressed mood.       Assessment & Plan:  The patient's preventative maintenance and recommended screening tests for an annual wellness exam were reviewed in full today. Brought up to date unless services declined.  Counselled on the importance of diet, exercise, and its role in overall health and mortality. The patient's FH and SH was reviewed, including their home life, tobacco status, and drug and alcohol status.   DVE/PAP: not indicated, no family history of uterine and ovarian cancer Colon: Dr. Hilarie Fredrickson, tubular adenoma 09/02/2013, plan repeat in likely 3 years Mammo: nml 06/2014 DEXA: osteoporosis.. Then normal in 2013 after fosamax/boniva for 5 years,  Due for repeat in 2018 Vaccines: uptodate with Td, shingles, PVax,  prevnar. Former smoker 20 pack year history

## 2015-02-16 NOTE — Assessment & Plan Note (Signed)
Now with worsening dizziness, will re-eval sooner.

## 2015-02-16 NOTE — Assessment & Plan Note (Signed)
Well controlled. Continue current medication.  

## 2015-02-16 NOTE — Assessment & Plan Note (Addendum)
Due for re-eval. 

## 2015-02-16 NOTE — Assessment & Plan Note (Addendum)
Orthostatics: Negative.  Will send for B12, cbc, TSH, CMET Send for repeat carotid dopplers.  Possible med SE but no recent changes.

## 2015-02-16 NOTE — Patient Instructions (Signed)
Stop at front desk to set up carotid dopplers.  Stop at lab on way out.  Try to work on regular exercise.

## 2015-02-17 LAB — CBC WITH DIFFERENTIAL/PLATELET
BASOS ABS: 0 10*3/uL (ref 0.0–0.1)
BASOS PCT: 0 % (ref 0–1)
EOS ABS: 0.2 10*3/uL (ref 0.0–0.7)
EOS PCT: 3 % (ref 0–5)
HEMATOCRIT: 35.6 % — AB (ref 36.0–46.0)
Hemoglobin: 12 g/dL (ref 12.0–15.0)
LYMPHS ABS: 3 10*3/uL (ref 0.7–4.0)
Lymphocytes Relative: 36 % (ref 12–46)
MCH: 33.6 pg (ref 26.0–34.0)
MCHC: 33.7 g/dL (ref 30.0–36.0)
MCV: 99.7 fL (ref 78.0–100.0)
MPV: 8.6 fL (ref 8.6–12.4)
Monocytes Absolute: 0.4 10*3/uL (ref 0.1–1.0)
Monocytes Relative: 5 % (ref 3–12)
NEUTROS PCT: 56 % (ref 43–77)
Neutro Abs: 4.6 10*3/uL (ref 1.7–7.7)
Platelets: 327 10*3/uL (ref 150–400)
RBC: 3.57 MIL/uL — ABNORMAL LOW (ref 3.87–5.11)
RDW: 15.4 % (ref 11.5–15.5)
WBC: 8.3 10*3/uL (ref 4.0–10.5)

## 2015-02-19 ENCOUNTER — Ambulatory Visit (INDEPENDENT_AMBULATORY_CARE_PROVIDER_SITE_OTHER): Payer: Medicare Other

## 2015-02-19 DIAGNOSIS — I6523 Occlusion and stenosis of bilateral carotid arteries: Secondary | ICD-10-CM | POA: Diagnosis not present

## 2015-02-19 DIAGNOSIS — R42 Dizziness and giddiness: Secondary | ICD-10-CM | POA: Diagnosis not present

## 2015-02-20 ENCOUNTER — Telehealth: Payer: Self-pay | Admitting: Family Medicine

## 2015-02-20 NOTE — Telephone Encounter (Signed)
Noted  

## 2015-02-20 NOTE — Telephone Encounter (Signed)
[facility]  Patient Name: Marilyn Sharp  DOB: Oct 04, 1935    Initial Comment Caller states that she had heart palpitations once this morning and experienced dizziness but only when bending over.    Nurse Assessment  Nurse: Raynelle Dick, RN, Jonita Albee Date/Time (Eastern Time): 02/20/2015 11:00:11 AM  Confirm and document reason for call. If symptomatic, describe symptoms. ---Caller states that she had heart palpitations once this morning and experienced dizziness but only when bending over. Caller states that she feels fine right now. Has experienced palpitations before. Caller states that she had a carotid doppler yesterday following a wellness check on 7/8, which came back unchanged from procedure 2 years ago.  Has the patient traveled out of the country within the last 30 days? ---Not Applicable  Does the patient require triage? ---Yes  Related visit to physician within the last 2 weeks? ---Yes  Does the PT have any chronic conditions? (i.e. diabetes, asthma, etc.) ---Yes  List chronic conditions. ---HTN     Guidelines    Guideline Title Affirmed Question Affirmed Notes  Heart Rate and Heartbeat Questions Palpitations (all triage questions negative)    Final Disposition User   [place], RN, Jonita Albee    Disagree/Comply: Comply

## 2015-02-22 ENCOUNTER — Other Ambulatory Visit: Payer: Self-pay | Admitting: Family Medicine

## 2015-02-27 ENCOUNTER — Encounter: Payer: Medicare Other | Admitting: Family Medicine

## 2015-04-23 ENCOUNTER — Other Ambulatory Visit: Payer: Self-pay | Admitting: Family Medicine

## 2015-04-23 DIAGNOSIS — I10 Essential (primary) hypertension: Secondary | ICD-10-CM | POA: Insufficient documentation

## 2015-04-23 DIAGNOSIS — R0602 Shortness of breath: Secondary | ICD-10-CM | POA: Insufficient documentation

## 2015-08-16 ENCOUNTER — Other Ambulatory Visit: Payer: Self-pay | Admitting: Internal Medicine

## 2015-08-16 DIAGNOSIS — Z1231 Encounter for screening mammogram for malignant neoplasm of breast: Secondary | ICD-10-CM

## 2015-08-17 ENCOUNTER — Other Ambulatory Visit: Payer: Self-pay | Admitting: Internal Medicine

## 2015-08-17 DIAGNOSIS — R270 Ataxia, unspecified: Secondary | ICD-10-CM

## 2015-08-17 DIAGNOSIS — R42 Dizziness and giddiness: Secondary | ICD-10-CM

## 2015-08-23 ENCOUNTER — Ambulatory Visit: Payer: Medicare Other

## 2015-09-05 ENCOUNTER — Other Ambulatory Visit: Payer: Self-pay | Admitting: Physician Assistant

## 2015-09-05 DIAGNOSIS — IMO0002 Reserved for concepts with insufficient information to code with codable children: Secondary | ICD-10-CM

## 2015-09-06 ENCOUNTER — Ambulatory Visit
Admission: RE | Admit: 2015-09-06 | Discharge: 2015-09-06 | Disposition: A | Discharge status: Home / Self Care | Payer: Medicare Other | Source: Ambulatory Visit | Attending: Internal Medicine | Admitting: Internal Medicine

## 2015-09-06 ENCOUNTER — Ambulatory Visit
Admission: RE | Admit: 2015-09-06 | Discharge: 2015-09-06 | Disposition: A | Discharge status: Home / Self Care | Payer: Medicare Other | Source: Ambulatory Visit | Attending: Physician Assistant | Admitting: Physician Assistant

## 2015-09-06 DIAGNOSIS — R9082 White matter disease, unspecified: Secondary | ICD-10-CM | POA: Insufficient documentation

## 2015-09-06 DIAGNOSIS — D7589 Other specified diseases of blood and blood-forming organs: Secondary | ICD-10-CM | POA: Insufficient documentation

## 2015-09-06 DIAGNOSIS — M546 Pain in thoracic spine: Secondary | ICD-10-CM | POA: Diagnosis present

## 2015-09-06 DIAGNOSIS — R270 Ataxia, unspecified: Secondary | ICD-10-CM

## 2015-09-06 DIAGNOSIS — M4854XA Collapsed vertebra, not elsewhere classified, thoracic region, initial encounter for fracture: Secondary | ICD-10-CM | POA: Insufficient documentation

## 2015-09-06 DIAGNOSIS — IMO0002 Reserved for concepts with insufficient information to code with codable children: Secondary | ICD-10-CM

## 2015-09-06 DIAGNOSIS — R42 Dizziness and giddiness: Secondary | ICD-10-CM | POA: Diagnosis not present

## 2015-09-07 ENCOUNTER — Encounter
Admission: RE | Admit: 2015-09-07 | Discharge: 2015-09-07 | Disposition: A | Discharge status: Home / Self Care | Payer: Medicare Other | Source: Ambulatory Visit | Attending: Orthopedic Surgery | Admitting: Orthopedic Surgery

## 2015-09-07 DIAGNOSIS — Z01810 Encounter for preprocedural cardiovascular examination: Secondary | ICD-10-CM | POA: Diagnosis present

## 2015-09-07 DIAGNOSIS — Z01812 Encounter for preprocedural laboratory examination: Secondary | ICD-10-CM | POA: Insufficient documentation

## 2015-09-07 HISTORY — DX: Anxiety disorder, unspecified: F41.9

## 2015-09-07 LAB — SURGICAL PCR SCREEN
MRSA, PCR: NEGATIVE
Staphylococcus aureus: NEGATIVE

## 2015-09-07 LAB — CBC
HCT: 36.9 % (ref 35.0–47.0)
HEMOGLOBIN: 12.1 g/dL (ref 12.0–16.0)
MCH: 32.7 pg (ref 26.0–34.0)
MCHC: 32.7 g/dL (ref 32.0–36.0)
MCV: 99.9 fL (ref 80.0–100.0)
Platelets: 272 10*3/uL (ref 150–440)
RBC: 3.7 MIL/uL — ABNORMAL LOW (ref 3.80–5.20)
RDW: 14.8 % — ABNORMAL HIGH (ref 11.5–14.5)
WBC: 6.1 10*3/uL (ref 3.6–11.0)

## 2015-09-07 LAB — BASIC METABOLIC PANEL
Anion gap: 6 (ref 5–15)
BUN: 25 mg/dL — AB (ref 6–20)
CHLORIDE: 106 mmol/L (ref 101–111)
CO2: 28 mmol/L (ref 22–32)
Calcium: 9 mg/dL (ref 8.9–10.3)
Creatinine, Ser: 0.88 mg/dL (ref 0.44–1.00)
GFR calc Af Amer: >60 mL/min (ref >60)
GLUCOSE: 93 mg/dL (ref 65–99)
Potassium: 4 mmol/L (ref 3.5–5.1)
SODIUM: 140 mmol/L (ref 135–145)

## 2015-09-07 NOTE — Pre-Procedure Instructions (Signed)
Pre-op EKG called to Dr. Ronelle Nigh, medical clearance requested.

## 2015-09-07 NOTE — Patient Instructions (Addendum)
  Your procedure is scheduled on: September 11, 2015 (Tuesday) Report to Day Surgery.[facility]) Second Floor To find out your arrival time please call [phone] between 1PM - 3PM on September 10, 2015(Monday).  Remember: Instructions that are not followed completely may result in serious medical risk, up to and including death, or upon the discretion of your surgeon and anesthesiologist your surgery may need to be rescheduled.    __x__ 1. Do not eat food or drink liquids after midnight. No gum chewing or hard candies.     __x_ 2. No Alcohol for 24 hours before or after surgery.   ____ 3. Bring all medications with you on the day of surgery if instructed.    __x__ 4. Notify your doctor if there is any change in your medical condition     (cold, fever, infections).     Do not wear jewelry, make-up, hairpins, clips or nail polish.  Do not wear lotions, powders, or perfumes. You may wear deodorant.  Do not shave 48 hours prior to surgery. Men may shave face and neck.  Do not bring valuables to the hospital.    [facility] is not responsible for any belongings or valuables.               Contacts, dentures or bridgework may not be worn into surgery.  Leave your suitcase in the car. After surgery it may be brought to your room.  For patients admitted to the hospital, discharge time is determined by your                treatment team.   Patients discharged the day of surgery will not be allowed to drive home.   Please read over the following fact sheets that you were given:   MRSA Information and Surgical Site Infection Prevention   __x__ Take these medicines the morning of surgery with A SIP OF WATER:    1. Losartan  2. Metoprolol  3.   4.  5.  6.  ____ Fleet Enema (as directed)   __x__ Use CHG Soap as directed  ____ Use inhalers on the day of surgery  ____ Stop metformin 2 days prior to surgery    ____ Take 1/2 of usual insulin dose the night before surgery and none on  the morning of surgery.   __x__ Stop Coumadin/Plavix/aspirin on (Stop Aspirin Now)  __x__ Stop Anti-inflammatories on (NO NSAIDS) (Stop Lodine if taking ) Tramadol is ok to continue take)   __x_ Stop supplements until after surgery.  (Stop Fish Oil and Probiotic now)  ____ Bring C-Pap to the hospital.

## 2015-09-07 NOTE — Pre-Procedure Instructions (Signed)
Medical clearance request for abnormal pre-op EKG called and faxed to Dr. Rudene Christians office.

## 2015-09-11 ENCOUNTER — Encounter: Payer: Self-pay | Admitting: *Deleted

## 2015-09-11 ENCOUNTER — Ambulatory Visit: Payer: Medicare Other

## 2015-09-11 ENCOUNTER — Ambulatory Visit: Payer: Medicare Other | Admitting: Anesthesiology

## 2015-09-11 ENCOUNTER — Ambulatory Visit
Admission: RE | Admit: 2015-09-11 | Discharge: 2015-09-11 | Disposition: A | Discharge status: Home / Self Care | Payer: Medicare Other | Source: Ambulatory Visit | Attending: Orthopedic Surgery | Admitting: Orthopedic Surgery

## 2015-09-11 ENCOUNTER — Encounter
Admission: RE | Disposition: A | Discharge status: Home / Self Care | Payer: Self-pay | Source: Ambulatory Visit | Attending: Orthopedic Surgery

## 2015-09-11 DIAGNOSIS — Z9889 Other specified postprocedural states: Secondary | ICD-10-CM | POA: Diagnosis not present

## 2015-09-11 DIAGNOSIS — S22089A Unspecified fracture of T11-T12 vertebra, initial encounter for closed fracture: Secondary | ICD-10-CM | POA: Insufficient documentation

## 2015-09-11 DIAGNOSIS — E785 Hyperlipidemia, unspecified: Secondary | ICD-10-CM | POA: Insufficient documentation

## 2015-09-11 DIAGNOSIS — Z881 Allergy status to other antibiotic agents status: Secondary | ICD-10-CM | POA: Diagnosis not present

## 2015-09-11 DIAGNOSIS — Z791 Long term (current) use of non-steroidal anti-inflammatories (NSAID): Secondary | ICD-10-CM | POA: Diagnosis not present

## 2015-09-11 DIAGNOSIS — X58XXXA Exposure to other specified factors, initial encounter: Secondary | ICD-10-CM | POA: Diagnosis not present

## 2015-09-11 DIAGNOSIS — Z888 Allergy status to other drugs, medicaments and biological substances status: Secondary | ICD-10-CM | POA: Insufficient documentation

## 2015-09-11 DIAGNOSIS — J302 Other seasonal allergic rhinitis: Secondary | ICD-10-CM | POA: Diagnosis not present

## 2015-09-11 DIAGNOSIS — Z9842 Cataract extraction status, left eye: Secondary | ICD-10-CM | POA: Insufficient documentation

## 2015-09-11 DIAGNOSIS — Z885 Allergy status to narcotic agent status: Secondary | ICD-10-CM | POA: Diagnosis not present

## 2015-09-11 DIAGNOSIS — Z7982 Long term (current) use of aspirin: Secondary | ICD-10-CM | POA: Diagnosis not present

## 2015-09-11 DIAGNOSIS — Z419 Encounter for procedure for purposes other than remedying health state, unspecified: Secondary | ICD-10-CM

## 2015-09-11 DIAGNOSIS — Z836 Family history of other diseases of the respiratory system: Secondary | ICD-10-CM | POA: Diagnosis not present

## 2015-09-11 DIAGNOSIS — I10 Essential (primary) hypertension: Secondary | ICD-10-CM | POA: Insufficient documentation

## 2015-09-11 DIAGNOSIS — Z9841 Cataract extraction status, right eye: Secondary | ICD-10-CM | POA: Diagnosis not present

## 2015-09-11 DIAGNOSIS — Z882 Allergy status to sulfonamides status: Secondary | ICD-10-CM | POA: Insufficient documentation

## 2015-09-11 DIAGNOSIS — Z79899 Other long term (current) drug therapy: Secondary | ICD-10-CM | POA: Insufficient documentation

## 2015-09-11 HISTORY — PX: KYPHOPLASTY: SHX5884

## 2015-09-11 SURGERY — KYPHOPLASTY
Anesthesia: Choice | Site: Spine Thoracic | Wound class: Clean

## 2015-09-11 MED ORDER — FAMOTIDINE 20 MG PO TABS
ORAL_TABLET | ORAL | Status: AC
  Administered 2015-09-11: 20 mg via ORAL
  Filled 2015-09-11: qty 1

## 2015-09-11 MED ORDER — IOHEXOL 180 MG/ML  SOLN
INTRAMUSCULAR | Status: AC
  Filled 2015-09-11: qty 40

## 2015-09-11 MED ORDER — LIDOCAINE HCL 1 % IJ SOLN
INTRAMUSCULAR | Status: DC | PRN
  Administered 2015-09-11: 10 mL

## 2015-09-11 MED ORDER — IOHEXOL 180 MG/ML  SOLN
INTRAMUSCULAR | Status: DC | PRN
  Administered 2015-09-11: 20 mL

## 2015-09-11 MED ORDER — HYDRALAZINE HCL 20 MG/ML IJ SOLN
INTRAMUSCULAR | Status: AC
  Administered 2015-09-11: 10 mg via INTRAVENOUS
  Filled 2015-09-11: qty 1

## 2015-09-11 MED ORDER — HYDROCODONE-ACETAMINOPHEN 5-325 MG PO TABS: 1.0000 | ORAL_TABLET | Freq: Four times a day (QID) | ORAL | Status: DC | PRN

## 2015-09-11 MED ORDER — MIDAZOLAM HCL 2 MG/2ML IJ SOLN
INTRAMUSCULAR | Status: DC | PRN
  Administered 2015-09-11: 0.5 mg via INTRAVENOUS
  Administered 2015-09-11: .5 mg via INTRAVENOUS
  Administered 2015-09-11: 1 mg via INTRAVENOUS

## 2015-09-11 MED ORDER — BUPIVACAINE-EPINEPHRINE (PF) 0.5% -1:200000 IJ SOLN
INTRAMUSCULAR | Status: AC
  Filled 2015-09-11: qty 30

## 2015-09-11 MED ORDER — LACTATED RINGERS IV SOLN
INTRAVENOUS | Status: DC
  Administered 2015-09-11: 11:00:00 via INTRAVENOUS

## 2015-09-11 MED ORDER — CEFAZOLIN SODIUM-DEXTROSE 2-3 GM-% IV SOLR
2.0000 g | Freq: Once | INTRAVENOUS | Status: AC
  Administered 2015-09-11: 2 g via INTRAVENOUS

## 2015-09-11 MED ORDER — FAMOTIDINE 20 MG PO TABS
20.0000 mg | ORAL_TABLET | Freq: Once | ORAL | Status: AC
  Administered 2015-09-11: 20 mg via ORAL

## 2015-09-11 MED ORDER — BUPIVACAINE-EPINEPHRINE (PF) 0.5% -1:200000 IJ SOLN
INTRAMUSCULAR | Status: DC | PRN
  Administered 2015-09-11: 20 mL

## 2015-09-11 MED ORDER — FENTANYL CITRATE (PF) 100 MCG/2ML IJ SOLN: 25.0000 ug | INTRAMUSCULAR | Status: DC | PRN

## 2015-09-11 MED ORDER — LABETALOL HCL 5 MG/ML IV SOLN
5.0000 mg | INTRAVENOUS | Status: DC | PRN
  Administered 2015-09-11 (×4): 5 mg via INTRAVENOUS
  Filled 2015-09-11 (×3): qty 4

## 2015-09-11 MED ORDER — HYDRALAZINE HCL 20 MG/ML IJ SOLN
10.0000 mg | Freq: Once | INTRAMUSCULAR | Status: AC
  Administered 2015-09-11: 10 mg via INTRAVENOUS

## 2015-09-11 MED ORDER — LABETALOL HCL 5 MG/ML IV SOLN
INTRAVENOUS | Status: AC
  Administered 2015-09-11: 5 mg via INTRAVENOUS
  Filled 2015-09-11: qty 4

## 2015-09-11 MED ORDER — CEFAZOLIN SODIUM-DEXTROSE 2-3 GM-% IV SOLR
INTRAVENOUS | Status: AC
  Filled 2015-09-11: qty 50

## 2015-09-11 MED ORDER — LIDOCAINE HCL (PF) 1 % IJ SOLN
INTRAMUSCULAR | Status: AC
  Filled 2015-09-11: qty 30

## 2015-09-11 MED ORDER — KETAMINE HCL 10 MG/ML IJ SOLN
INTRAMUSCULAR | Status: DC | PRN
  Administered 2015-09-11 (×2): 10 mg via INTRAVENOUS

## 2015-09-11 MED ORDER — ONDANSETRON HCL 4 MG/2ML IJ SOLN: 4.0000 mg | Freq: Once | INTRAMUSCULAR | Status: DC | PRN

## 2015-09-11 MED ORDER — PROPOFOL 10 MG/ML IV BOLUS
INTRAVENOUS | Status: DC | PRN
  Administered 2015-09-11 (×2): 20 mg via INTRAVENOUS

## 2015-09-11 SURGICAL SUPPLY — 13 items
CEMENT KYPHON CX01A KIT/MIXER (Cement) ×3 IMPLANT
DEVICE BIOPSY BONE KYPHX (INSTRUMENTS) ×3 IMPLANT
DRAPE C-ARM XRAY 36X54 (DRAPES) ×3 IMPLANT
DURAPREP 26ML APPLICATOR (WOUND CARE) ×3 IMPLANT
GLOVE SURG ORTHO 9.0 STRL STRW (GLOVE) ×3 IMPLANT
GOWN SPECIALTY ULTRA XL (MISCELLANEOUS) ×3 IMPLANT
GOWN STRL REUS W/ TWL LRG LVL3 (GOWN DISPOSABLE) ×1 IMPLANT
GOWN STRL REUS W/TWL LRG LVL3 (GOWN DISPOSABLE) ×2
LIQUID BAND (GAUZE/BANDAGES/DRESSINGS) ×3 IMPLANT
PACK KYPHOPLASTY (MISCELLANEOUS) ×3 IMPLANT
STRAP SAFETY BODY (MISCELLANEOUS) ×3 IMPLANT
TRAY KYPHOPAK 15/3 EXPRESS 1ST (MISCELLANEOUS) IMPLANT
TRAY KYPHOPAK 20/3 EXPRESS 1ST (MISCELLANEOUS) ×3 IMPLANT

## 2015-09-11 NOTE — Anesthesia Preprocedure Evaluation (Signed)
Anesthesia Evaluation  Patient identified by MRN, date of birth, ID band Patient awake    Reviewed: Allergy & Precautions, NPO status , Patient's Chart, lab work & pertinent test results, reviewed documented beta blocker date and time   Airway Mallampati: II  TM Distance: >3 FB     Dental  (+) Chipped   Pulmonary former smoker,           Cardiovascular hypertension, Pt. on medications and Pt. on home beta blockers + Peripheral Vascular Disease       Neuro/Psych  Headaches, PSYCHIATRIC DISORDERS Anxiety Depression  Neuromuscular disease    GI/Hepatic GERD  Controlled,  Endo/Other    Renal/GU Renal InsufficiencyRenal disease     Musculoskeletal  (+) Arthritis ,   Abdominal   Peds  Hematology   Anesthesia Other Findings Neuropathy. Hypertensive.  Reproductive/Obstetrics                             Anesthesia Physical Anesthesia Plan  ASA: III  Anesthesia Plan:    Post-op Pain Management:    Induction:   Airway Management Planned:   Additional Equipment:   Intra-op Plan:   Post-operative Plan:   Informed Consent: I have reviewed the patients History and Physical, chart, labs and discussed the procedure including the risks, benefits and alternatives for the proposed anesthesia with the patient or authorized representative who has indicated his/her understanding and acceptance.     Plan Discussed with: CRNA  Anesthesia Plan Comments:         Anesthesia Quick Evaluation

## 2015-09-11 NOTE — Op Note (Signed)
09/11/2015  12:31 PM  PATIENT:  Marilyn Sharp  80 y.o. female  PRE-OPERATIVE DIAGNOSIS:  T12 COMPRESSION FX  POST-OPERATIVE DIAGNOSIS:  T12 COMPRESSION FX  PROCEDURE:  Procedure(s): KYPHOPLASTY T12 (N/A)  SURGEON: Laurene Footman, MD  ASSISTANTS: None  ANESTHESIA:   local and MAC  EBL:     BLOOD ADMINISTERED:none  DRAINS: none   LOCAL MEDICATIONS USED:  BUPIVICAINE  and XYLOCAINE   SPECIMEN:  Source of Specimen:  T12 vertebral body  DISPOSITION OF SPECIMEN:  PATHOLOGY  COUNTS:  YES  TOURNIQUET:  * No tourniquets in log *  IMPLANTS: Bone cement  DICTATION: .Dragon Dictation patient brought the operating room and after adequate sedation was given, patient was placed prone. C-arm was brought in and good visualization in both AP and lateral projections of T12 obtained. After patient identification and timeout procedures were completed, skin was prepped with alcohol and 10 cc of 1% Xylocaine infiltrated on each side for initial local anesthetic. The back was then prepped and draped in sterile fashion and repeat timeout procedure completed. Spinal needle was then used to get down to the pedicle on each side with a mixture of 1% Xylocaine have percent Sensorcaine with epinephrine infiltrated along the tract. Small incision was made and a trocar advanced on the right side first and a perpendicular approach on both sides getting into the vertebral body and biopsy obtained drilling was carried out followed by inflation of balloon to about 40 half to 5 cc. Coming on the left side identical procedure without the biopsy and inflation of the balloon for total approximate 9 cc this gave very good correction of the kyphotic deformity. The balloon on the left was deflated and bone cement*and fill this defect was carried out and then on the right side with good fill of the intervertebral body with significant correction of kyphosis. There is no extravasation. At the close the case the trochars  removed and there is no migration of cement into the pedicles. Permanent C-arm views obtained. Wounds closed with Dermabond followed by a Band-Aid  PLAN OF CARE: Discharge to home after PACU  PATIENT DISPOSITION:  PACU - hemodynamically stable.

## 2015-09-11 NOTE — Transfer of Care (Signed)
Immediate Anesthesia Transfer of Care Note  Patient: Marilyn Sharp  Procedure(s) Performed: Procedure(s): KYPHOPLASTY T12 (N/A)  Patient Location: PACU  Anesthesia Type:General  Level of Consciousness: sedated  Airway & Oxygen Therapy: Patient Spontanous Breathing and Patient connected to face mask oxygen  Post-op Assessment: Report given to RN and Post -op Vital signs reviewed and stable  Post vital signs: Reviewed and stable  Last Vitals:  Filed Vitals:   09/11/15 1048  BP: 159/95  Pulse: 74  Temp: 36.6 C  Resp: 16    Complications: No apparent anesthesia complications

## 2015-09-11 NOTE — Discharge Instructions (Addendum)
Remove Band-Aid's on Thursday. Okay to shower after that. Minimize activity today, resume normal activities tomorrow      AMBULATORY SURGERY  DISCHARGE INSTRUCTIONS   1) The drugs that you were given will stay in your system until tomorrow so for the next 24 hours you should not:  A) Drive an automobile B) Make any legal decisions C) Drink any alcoholic beverage   2) You may resume regular meals tomorrow.  Today it is better to start with liquids and gradually work up to solid foods.  You may eat anything you prefer, but it is better to start with liquids, then soup and crackers, and gradually work up to solid foods.   3) Please notify your doctor immediately if you have any unusual bleeding, trouble breathing, redness and pain at the surgery site, drainage, fever, or pain not relieved by medication. 4)   5) Your post-operative visit with Dr.                                     is: Date:                        Time:    Please call to schedule your post-operative visit.  6) Additional Instructions:

## 2015-09-11 NOTE — H&P (Signed)
Reviewed paper H+P, will be scanned into chart. No changes noted.  

## 2015-09-11 NOTE — Anesthesia Postprocedure Evaluation (Signed)
Anesthesia Post Note  Patient: Marilyn Sharp  Procedure(s) Performed: Procedure(s) (LRB): KYPHOPLASTY T12 (N/A)  Patient location during evaluation: PACU Anesthesia Type: MAC Level of consciousness: awake Pain management: pain level controlled Vital Signs Assessment: post-procedure vital signs reviewed and stable Respiratory status: spontaneous breathing Cardiovascular status: blood pressure returned to baseline Anesthetic complications: no    Last Vitals:  Filed Vitals:   09/11/15 1407 09/11/15 1432  BP: 159/102 152/70  Pulse: 73 76  Temp: 36.7 C   Resp: 16 16    Last Pain:  Filed Vitals:   09/11/15 1434  PainSc: [place]

## 2015-09-12 LAB — SURGICAL PATHOLOGY

## 2015-09-27 ENCOUNTER — Other Ambulatory Visit: Payer: Self-pay | Admitting: Internal Medicine

## 2015-09-27 ENCOUNTER — Ambulatory Visit
Admission: RE | Admit: 2015-09-27 | Discharge: 2015-09-27 | Disposition: A | Discharge status: Home / Self Care | Payer: Medicare Other | Source: Ambulatory Visit | Attending: Internal Medicine | Admitting: Internal Medicine

## 2015-09-27 DIAGNOSIS — Z1231 Encounter for screening mammogram for malignant neoplasm of breast: Secondary | ICD-10-CM

## 2015-10-20 IMAGING — CR DG CERVICAL SPINE COMPLETE 4+V
6 series · 6 of 6 positions shown · non-contrast
Comparison: None.

CLINICAL DATA: Right shoulder pain.  Right hand weakness.

EXAM:
CERVICAL SPINE  4+ VIEWS

[view not recorded (1 of 6)]
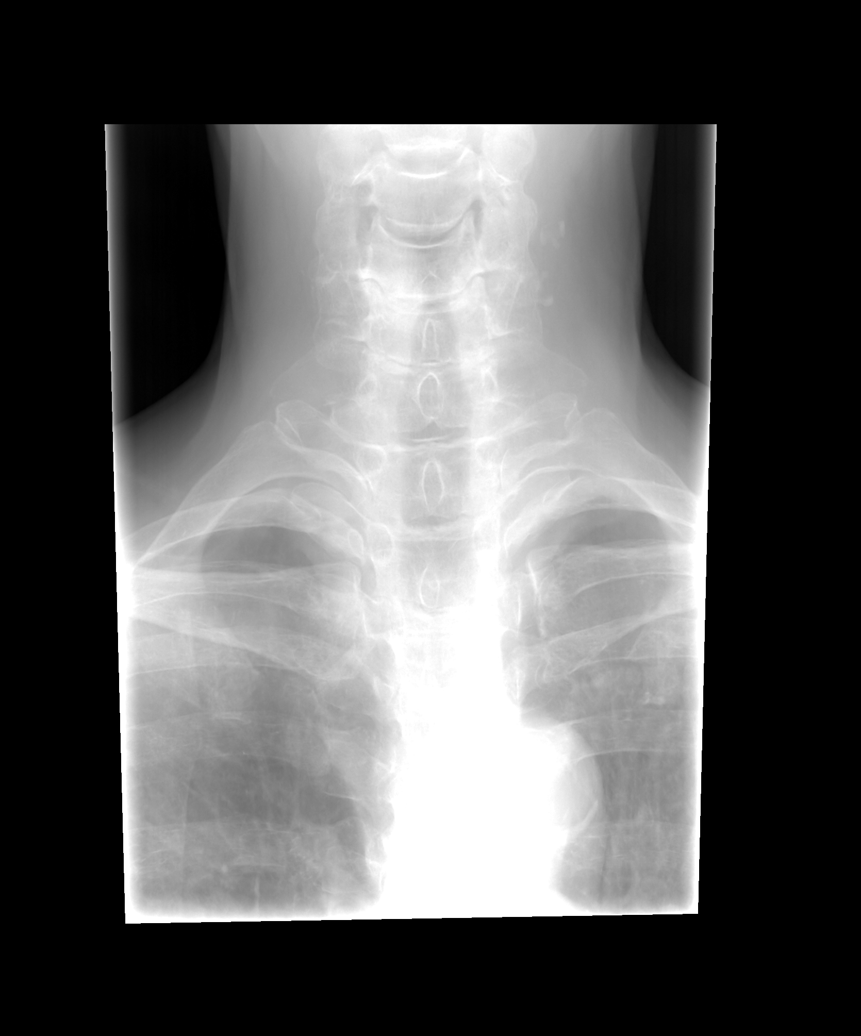

[view not recorded (2 of 6)]
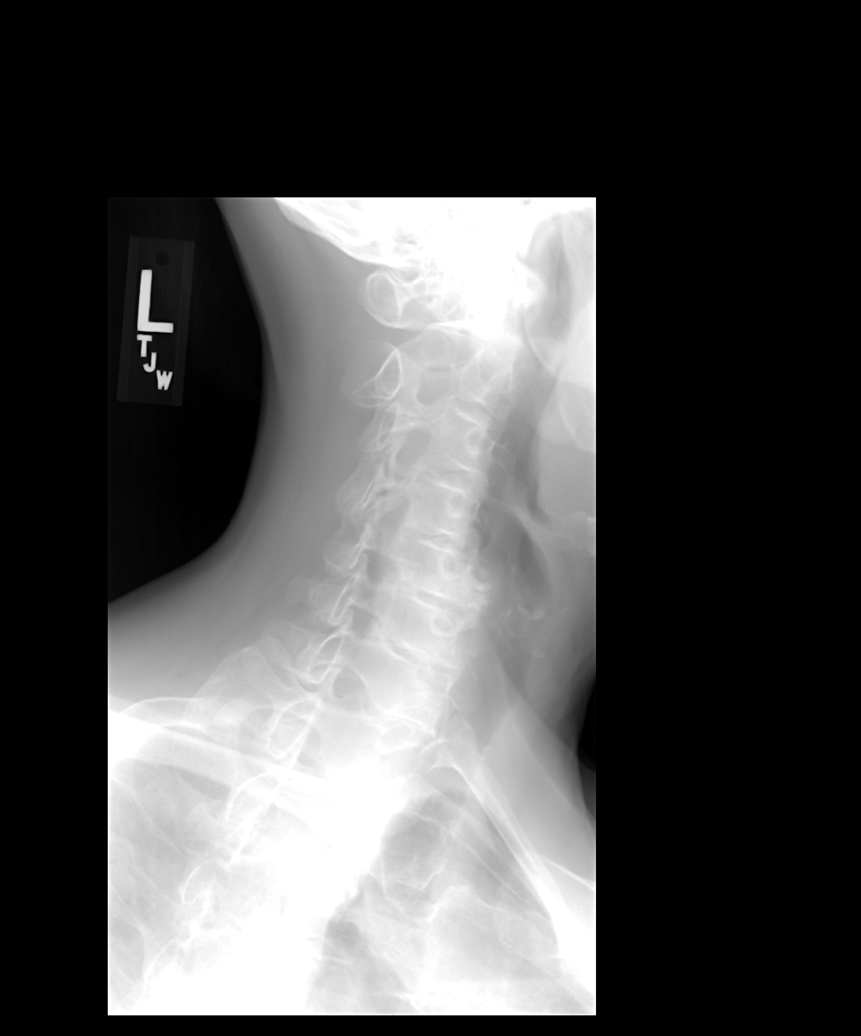

[view not recorded (3 of 6)]
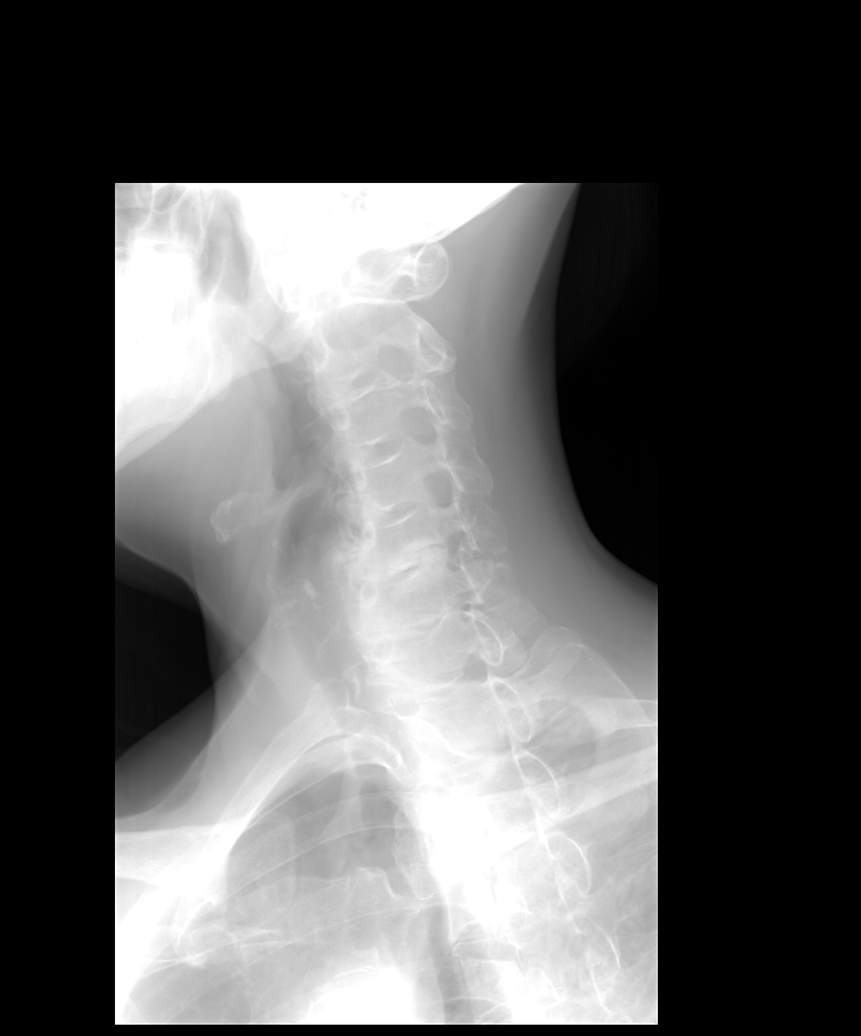

[view not recorded (4 of 6)]
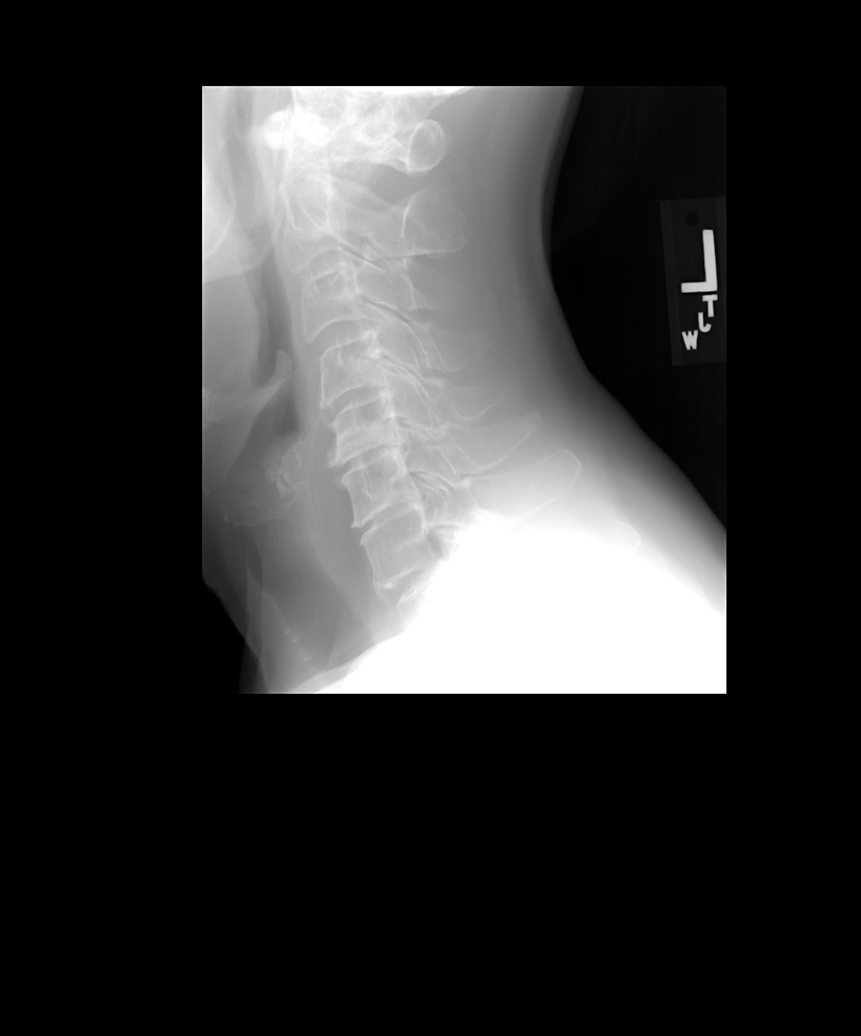

[view not recorded (5 of 6)]
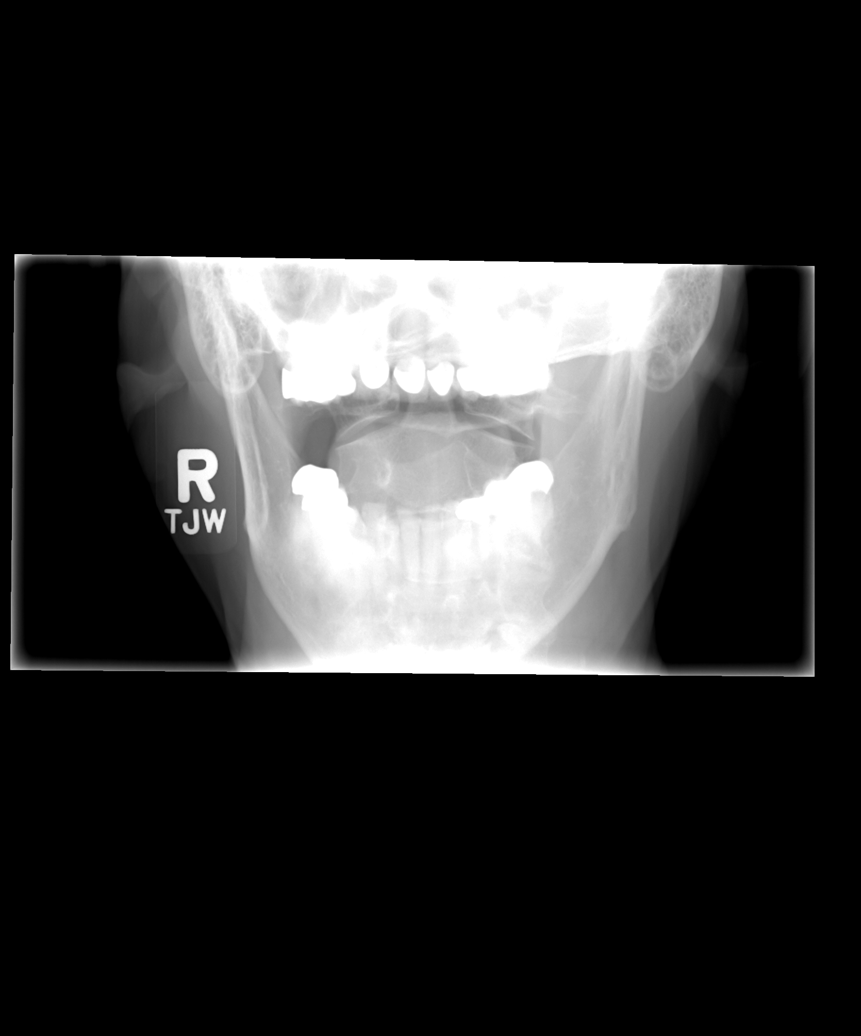

[view not recorded (6 of 6)]
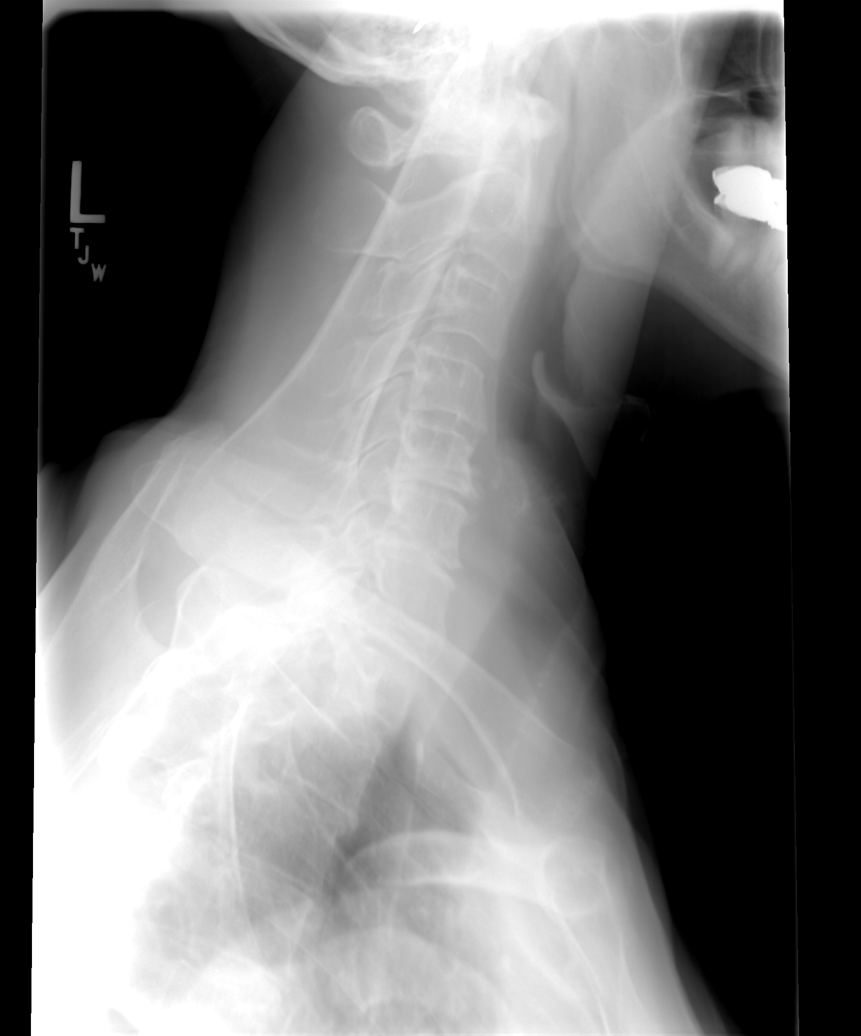

[6 of 6 positions shown; findings below may reference images not displayed]

FINDINGS: No fracture. No spondylolisthesis. There is mild loss of disc height
at C4-C5 with moderate loss of disc height at C5-C6 and C6-C7. There
are small endplate osteophytes at these levels.

There is moderate neural foraminal narrowing on the left at C5-C6
due to facet and uncovertebral spurring. There is mild neural
foraminal narrowing on the right at this same level. No other neural
foraminal narrowing.

Bones are demineralized.

Soft tissues are unremarkable.
IMPRESSION: No fracture or acute finding.  Degenerative changes as detailed.

## 2015-10-22 ENCOUNTER — Other Ambulatory Visit: Payer: Self-pay | Admitting: Family Medicine

## 2016-01-02 DIAGNOSIS — M810 Age-related osteoporosis without current pathological fracture: Secondary | ICD-10-CM | POA: Insufficient documentation

## 2016-06-06 ENCOUNTER — Telehealth: Payer: Self-pay | Admitting: Family Medicine

## 2016-06-06 NOTE — Telephone Encounter (Signed)
Spoke with patient that it's time to schedule a carotid u/s (1 yr fu). Patient states that she will call back to schedule appointment.

## 2016-06-10 ENCOUNTER — Encounter: Payer: Self-pay | Admitting: *Deleted

## 2016-07-01 ENCOUNTER — Encounter: Payer: Self-pay | Admitting: Internal Medicine

## 2016-08-28 ENCOUNTER — Emergency Department: Payer: Medicare Other

## 2016-08-28 ENCOUNTER — Emergency Department
Admission: EM | Admit: 2016-08-28 | Discharge: 2016-08-28 | Disposition: A | Discharge status: Home / Self Care | Payer: Medicare Other | Attending: Emergency Medicine | Admitting: Emergency Medicine

## 2016-08-28 ENCOUNTER — Encounter: Payer: Self-pay | Admitting: Emergency Medicine

## 2016-08-28 DIAGNOSIS — J111 Influenza due to unidentified influenza virus with other respiratory manifestations: Secondary | ICD-10-CM | POA: Diagnosis not present

## 2016-08-28 DIAGNOSIS — Z79899 Other long term (current) drug therapy: Secondary | ICD-10-CM | POA: Diagnosis not present

## 2016-08-28 DIAGNOSIS — I10 Essential (primary) hypertension: Secondary | ICD-10-CM | POA: Insufficient documentation

## 2016-08-28 DIAGNOSIS — Z87891 Personal history of nicotine dependence: Secondary | ICD-10-CM | POA: Insufficient documentation

## 2016-08-28 DIAGNOSIS — Z7982 Long term (current) use of aspirin: Secondary | ICD-10-CM | POA: Diagnosis not present

## 2016-08-28 DIAGNOSIS — R51 Headache: Secondary | ICD-10-CM | POA: Diagnosis present

## 2016-08-28 HISTORY — DX: Fatigue fracture of vertebra, lumbar region, initial encounter for fracture: M48.46XA

## 2016-08-28 LAB — CBC
HCT: 32.9 % — ABNORMAL LOW (ref 35.0–47.0)
HEMOGLOBIN: 11.3 g/dL — AB (ref 12.0–16.0)
MCH: 34.1 pg — ABNORMAL HIGH (ref 26.0–34.0)
MCHC: 34.3 g/dL (ref 32.0–36.0)
MCV: 99.4 fL (ref 80.0–100.0)
Platelets: 222 10*3/uL (ref 150–440)
RBC: 3.31 MIL/uL — ABNORMAL LOW (ref 3.80–5.20)
RDW: 16.2 % — ABNORMAL HIGH (ref 11.5–14.5)
WBC: 6.1 10*3/uL (ref 3.6–11.0)

## 2016-08-28 LAB — BASIC METABOLIC PANEL
ANION GAP: 8 (ref 5–15)
BUN: 21 mg/dL — AB (ref 6–20)
CALCIUM: 8.3 mg/dL — AB (ref 8.9–10.3)
CO2: 23 mmol/L (ref 22–32)
Chloride: 105 mmol/L (ref 101–111)
Creatinine, Ser: 0.88 mg/dL (ref 0.44–1.00)
GFR calc Af Amer: >60 mL/min (ref >60)
GFR calc non Af Amer: >60 mL/min (ref >60)
GLUCOSE: 114 mg/dL — AB (ref 65–99)
Potassium: 4 mmol/L (ref 3.5–5.1)
Sodium: 136 mmol/L (ref 135–145)

## 2016-08-28 LAB — INFLUENZA PANEL BY PCR (TYPE A & B)
Influenza A By PCR: POSITIVE — AB
Influenza B By PCR: NEGATIVE

## 2016-08-28 LAB — TROPONIN I

## 2016-08-28 MED ORDER — ACETAMINOPHEN 325 MG PO TABS
650.0000 mg | ORAL_TABLET | Freq: Once | ORAL | Status: AC
  Administered 2016-08-28: 650 mg via ORAL
  Filled 2016-08-28: qty 2

## 2016-08-28 MED ORDER — SODIUM CHLORIDE 0.9 % IV SOLN
1000.0000 mL | Freq: Once | INTRAVENOUS | Status: AC
  Administered 2016-08-28: 1000 mL via INTRAVENOUS

## 2016-08-28 MED ORDER — OSELTAMIVIR PHOSPHATE 75 MG PO CAPS: 75.0000 mg | ORAL_CAPSULE | Freq: Two times a day (BID) | ORAL | 0 refills | Status: AC

## 2016-08-28 MED ORDER — ACETAMINOPHEN 325 MG PO TABS
650.0000 mg | ORAL_TABLET | Freq: Once | ORAL | Status: AC | PRN
  Administered 2016-08-28: 650 mg via ORAL
  Filled 2016-08-28: qty 2

## 2016-08-28 MED ORDER — OSELTAMIVIR PHOSPHATE 75 MG PO CAPS
75.0000 mg | ORAL_CAPSULE | Freq: Once | ORAL | Status: AC
  Administered 2016-08-28: 75 mg via ORAL
  Filled 2016-08-28: qty 1

## 2016-08-28 NOTE — ED Triage Notes (Signed)
Pt arrived via EMS from home for reports of generalized body aches, headache and decreased mobility of feet. Symptoms began last night before going to bed. EMS reports VSS, NSR, 101 oral temp and stroke screen negative.

## 2016-08-28 NOTE — ED Provider Notes (Signed)
[facility] Emergency Department Provider Note   ____________________________________________    I have reviewed the triage vital signs and the nursing notes.   HISTORY  Chief Complaint Generalized Body Aches     HPI Marilyn Sharp is a 81 y.o. female who presents with complaints of fatigue, body aches, headache, diffuse weakness, mild cough over the last 2 days. Patient reports she was so weak that she was unable to relate to the bathroom last night. She has had chills but does not know if she has had fevers. No sick contacts reported. She lives at [facility] in an independent cottage.   Past Medical History:  Diagnosis Date  . Allergy   . Anxiety   . Arthritis   . Chicken pox   . Depression   . Family history of polyps in the colon   . GERD (gastroesophageal reflux disease)   . Headache   . Heart murmur   . Heart palpitations   . Hyperlipidemia   . Hypertension   . Kidney stone   . Shingles 2012  . Stress fracture of lumbar vertebra     Patient Active Problem List   Diagnosis Date Noted  . Dizziness and giddiness 02/16/2015  . Counseling regarding end of life decision making 02/16/2015  . Acute bronchitis 07/10/2014  . Carotid stenosis, asymptomatic, minimal 05/30/2014  . Right shoulder pain 10/13/2013  . Pain, eye, right 10/13/2013  . Major depression in remission ([place]) 06/02/2013  . GERD (gastroesophageal reflux disease) 06/02/2013  . Allergic rhinitis 06/02/2013  . Palpitations 06/02/2013  . Mitral valve prolapse 06/02/2013  . HTN (hypertension) 06/02/2013  . High cholesterol 06/02/2013  . Personal history of colonic polyps 06/02/2013  . Osteoporosis, unspecified 06/02/2013  . Post herpetic neuralgia 06/02/2013    Past Surgical History:  Procedure Laterality Date  . bilateral blephoplasty  2001  . BREAST CYST ASPIRATION  2001  . BREAST SURGERY Left    benign cyst  . CATARACT EXTRACTION, BILATERAL  2007  . EYE SURGERY     . KYPHOPLASTY N/A 09/11/2015   Procedure: KYPHOPLASTY T12;  Surgeon: Hessie Knows, MD;  Location: [facility] ORS;  Service: Orthopedics;  Laterality: N/A;  . RHINOPLASTY      Prior to Admission medications   Medication Sig Start Date End Date Taking? Authorizing Provider  aspirin 325 MG tablet Take 325 mg by mouth as needed.     Historical Provider, MD  Cholecalciferol (VITAMIN D-3) 1000 UNITS CAPS Take 1 capsule by mouth daily.    Historical Provider, MD  cyclobenzaprine (FLEXERIL) 10 MG tablet Take 10 mg by mouth 2 (two) times daily.    Historical Provider, MD  diphenoxylate-atropine (LOMOTIL) 2.5-0.025 MG per tablet Take 2 tablets by mouth as needed for diarrhea or loose stools. Reported on 09/11/2015    Historical Provider, MD  gabapentin (NEURONTIN) 300 MG capsule Take 300 mg by mouth as needed. Reported on 09/11/2015    Historical Provider, MD  HYDROcodone-acetaminophen (NORCO) 5-325 MG tablet Take 1 tablet by mouth every 6 (six) hours as needed for moderate pain. 09/11/15   Hessie Knows, MD  loratadine (CLARITIN) 10 MG tablet Take 10 mg by mouth daily as needed. Reported on 09/11/2015    Historical Provider, MD  losartan (COZAAR) 100 MG tablet TAKE 1 TABLET BY MOUTH DAILY 10/22/15   Idelle Crouch, MD  metoprolol succinate (TOPROL-XL) 25 MG 24 hr tablet Take 25 mg by mouth daily.    Historical Provider, MD  Omega-3 Fatty Acids (FISH OIL) 1200 MG CAPS Take 1 capsule by mouth daily.    Historical Provider, MD  oseltamivir (TAMIFLU) 75 MG capsule Take 1 capsule (75 mg total) by mouth 2 (two) times daily. 08/28/16 09/02/16  Lavonia Drafts, MD  Probiotic Product (PROBIOTIC DAILY) CAPS Take 1 capsule by mouth daily.    Historical Provider, MD  traMADol (ULTRAM) 50 MG tablet Take 50 mg by mouth every 6 (six) hours as needed.    Historical Provider, MD     Allergies Amlodipine besylate; Ciprofloxacin; Codeine; and Septra [sulfamethoxazole-trimethoprim]  Family History  Problem Relation Age of Onset    . Arthritis Mother   . Hypertension Mother   . Osteoporosis Mother   . COPD Father   . Arthritis Maternal Grandmother   . Heart disease Maternal Grandfather   . Colon cancer Neg Hx   . Breast cancer Neg Hx     Social History Social History  Substance Use Topics  . Smoking status: Former Smoker    Packs/day: 1.00    Years: 20.00    Types: Cigarettes    Quit date: 08/11/1973  . Smokeless tobacco: Never Used  . Alcohol use 1.2 oz/week    2 Glasses of wine per week    Review of Systems  Constitutional:As above Eyes: No visual changes.  ENT: Mild sore throat Cardiovascular: Denies chest pain. Respiratory: Denies shortness of breath. Mild cough Gastrointestinal: No abdominal pain.  Genitourinary: Negative for dysuria. Musculoskeletal: Negative for back pain. Positive for myalgias Skin: Negative for rash. Neurological: No focal deficits  10-point ROS otherwise negative.  ____________________________________________   PHYSICAL EXAM:  VITAL SIGNS: ED Triage Vitals  Enc Vitals Group     BP 08/28/16 1416 (!) 151/78     Pulse Rate 08/28/16 1416 78     Resp 08/28/16 1416 18     Temp 08/28/16 1416 (!) 100.9 F (38.3 C)     Temp Source 08/28/16 1416 Oral     SpO2 08/28/16 1416 93 %     Weight 08/28/16 1416 144 lb (65.3 kg)     Height 08/28/16 1416 5\' 6"  (1.676 m)     Head Circumference --      Peak Flow --      Pain Score 08/28/16 1424 3     Pain Loc --      Pain Edu? --      Excl. in [place]? --     Constitutional: Alert and oriented. No acute distress. Pleasant and interactive Eyes: Conjunctivae are normal.   Nose: No congestion/rhinnorhea. Mouth/Throat: Mucous membranes are moist.    Cardiovascular: Normal rate, regular rhythm. Grossly normal heart sounds.  Good peripheral circulation. Respiratory: Normal respiratory effort.  No retractions. Lungs CTAB. Gastrointestinal: Soft and nontender. No distention.  No CVA tenderness. Genitourinary:  deferred Musculoskeletal: No lower extremity tenderness nor edema.  Warm and well perfused Neurologic:  Normal speech and language. No gross focal neurologic deficits are appreciated.  Skin:  Skin is warm, dry and intact. No rash noted. Psychiatric: Mood and affect are normal. Speech and behavior are normal.  ____________________________________________   LABS (all labs ordered are listed, but only abnormal results are displayed)  Labs Reviewed  BASIC METABOLIC PANEL - Abnormal; Notable for the following:       Result Value   Glucose, Bld 114 (*)    BUN 21 (*)    Calcium 8.3 (*)    All other components within normal limits  CBC - Abnormal; Notable for  the following:    RBC 3.31 (*)    Hemoglobin 11.3 (*)    HCT 32.9 (*)    MCH 34.1 (*)    RDW 16.2 (*)    All other components within normal limits  INFLUENZA PANEL BY PCR (TYPE A & B) - Abnormal; Notable for the following:    Influenza A By PCR POSITIVE (*)    All other components within normal limits  TROPONIN I   ____________________________________________  EKG  ED ECG REPORT I, Lavonia Drafts, the attending physician, personally viewed and interpreted this ECG.  Date: 08/28/2016 EKG Time: 2:35 PM Rate: 75 Rhythm: normal sinus rhythm QRS Axis: normal Intervals: normal ST/T Wave abnormalities: Anterior T-wave inversions, this appears old Conduction Disturbances: none Narrative Interpretation: unremarkable  ____________________________________________  RADIOLOGY  No acute distress on chest x-ray ____________________________________________   PROCEDURES  Procedure(s) performed: No    Critical Care performed: No ____________________________________________   INITIAL IMPRESSION / ASSESSMENT AND PLAN / ED COURSE  Pertinent labs & imaging results that were available during my care of the patient were reviewed by me and considered in my medical decision making (see chart for details).  Patient overall  well-appearing and in no acute distress. Vital signs remarkable only for mild fever. Her influenza test is positive for influenza A, this is likely the cause of all of her symptoms. She may be mildly dehydrated, we will give IV fluids and reassess whether she is safe for discharge   patient treated with IV fluids and felt significantly better. She was able to ambulate easily to the bathroom. After discussing extensively with the patient she decided to go home although I did offer admission to the hospital. I feel this is reasonable given her improved vital signs and reassuring workup. I also discussed with staff at [facility] and they can provide respite care and also check on her frequently.   We did discuss the need to return if worsening condition ____________________________________________   FINAL CLINICAL IMPRESSION(S) / ED DIAGNOSES  Final diagnoses:  Influenza      NEW MEDICATIONS STARTED DURING THIS VISIT:  Discharge Medication List as of 08/28/2016  6:28 PM    START taking these medications   Details  oseltamivir (TAMIFLU) 75 MG capsule Take 1 capsule (75 mg total) by mouth 2 (two) times daily., Starting Thu 08/28/2016, Until Tue 09/02/2016, Print         Note:  This document was prepared using Dragon voice recognition software and may include unintentional dictation errors.    Lavonia Drafts, MD 08/28/16 2036

## 2016-08-28 NOTE — ED Triage Notes (Signed)
Pt c/o chest pain, cough, body aches, and difficulty moving both feet. Has not been drinking as much per friend. Denies NVD. Pain to sternal area and pt thinks it is from cough.

## 2016-09-30 ENCOUNTER — Encounter: Payer: Self-pay | Admitting: *Deleted

## 2016-10-23 ENCOUNTER — Other Ambulatory Visit: Payer: Self-pay | Admitting: Internal Medicine

## 2016-10-23 DIAGNOSIS — Z1231 Encounter for screening mammogram for malignant neoplasm of breast: Secondary | ICD-10-CM

## 2016-11-27 ENCOUNTER — Ambulatory Visit
Admission: RE | Admit: 2016-11-27 | Discharge: 2016-11-27 | Disposition: A | Discharge status: Home / Self Care | Payer: Medicare Other | Source: Ambulatory Visit | Attending: Internal Medicine | Admitting: Internal Medicine

## 2016-11-27 DIAGNOSIS — Z1231 Encounter for screening mammogram for malignant neoplasm of breast: Secondary | ICD-10-CM | POA: Insufficient documentation

## 2017-05-25 ENCOUNTER — Ambulatory Visit: Payer: Medicare Other | Attending: Orthopedic Surgery | Admitting: Occupational Therapy

## 2017-05-25 DIAGNOSIS — M25641 Stiffness of right hand, not elsewhere classified: Secondary | ICD-10-CM

## 2017-05-25 DIAGNOSIS — M79641 Pain in right hand: Secondary | ICD-10-CM

## 2017-05-25 DIAGNOSIS — M65341 Trigger finger, right ring finger: Secondary | ICD-10-CM | POA: Diagnosis present

## 2017-05-25 DIAGNOSIS — L905 Scar conditions and fibrosis of skin: Secondary | ICD-10-CM | POA: Diagnosis present

## 2017-05-25 DIAGNOSIS — M25642 Stiffness of left hand, not elsewhere classified: Secondary | ICD-10-CM

## 2017-05-25 NOTE — Patient Instructions (Signed)
Contrast in am  Tendon glides - gentle - not force or triggering    MC block splints for R 3rd and 4th to using during gripping act, sleeping  Can do ice massage over palm during day  Joint protection - about using larger joints And avoid tight and sustained grips- built objects up   discuss drawing, ,painting, holding leash , driving

## 2017-05-25 NOTE — Therapy (Signed)
[place] [facility] [address], [place], [address] Phone: [phone]   Fax:  [phone]  Occupational Therapy Evaluation  Patient Details  Name: Marilyn Sharp MRN: [id number] Date of Birth: 14-May-1936 Referring Provider: Rudene Christians  Encounter Date: 05/25/2017      OT End of Session - 05/25/17 1308    Visit Number 1   Number of Visits 8   Date for OT Re-Evaluation 06/22/17   OT Start Time 0917   OT Stop Time 1016   OT Time Calculation (min) 59 min   Activity Tolerance Patient tolerated treatment well   Behavior During Therapy [facility] for tasks assessed/performed      Past Medical History:  Diagnosis Date  . Allergy   . Anxiety   . Arthritis   . Chicken pox   . Depression   . Family history of polyps in the colon   . GERD (gastroesophageal reflux disease)   . Headache   . Heart murmur   . Heart palpitations   . Hyperlipidemia   . Hypertension   . Kidney stone   . Shingles 2012  . Stress fracture of lumbar vertebra     Past Surgical History:  Procedure Laterality Date  . bilateral blephoplasty  2001  . BREAST CYST ASPIRATION  2001  . BREAST SURGERY Left    benign cyst  . CATARACT EXTRACTION, BILATERAL  2007  . EYE SURGERY    . KYPHOPLASTY N/A 09/11/2015   Procedure: KYPHOPLASTY T12;  Surgeon: Hessie Knows, MD;  Location: [facility] ORS;  Service: Orthopedics;  Laterality: N/A;  . RHINOPLASTY      There were no vitals filed for this visit.      Subjective Assessment - 05/25/17 1239    Subjective  My R hand middle and ring finger trigger - the ring started first and wose - in the am the worse - is started about 6 wks ago - and I think it has to do holding the leash for my dog- new dog about that time and going to puppy class    Patient Stated Goals Want my hand to get better - not trigger so I can paint, draw , open jars , more strength , and no pain in palm with driving    Currently in Pain? No/denies           [facility] OT Assessment - 05/25/17 0001      Assessment   Diagnosis R hand trigger fingers    Referring Provider Rudene Christians   Onset Date 04/13/17     Home  Environment   Lives With Alone     Prior Function   Vocation Retired   Leisure Likes to paint, draw, read,  do puppy class with dog, R hand doiminant      Strength   Right Hand Grip (lbs) 41   Right Hand Lateral Pinch 13 lbs   Right Hand 3 Point Pinch 13 lbs   Left Hand Grip (lbs) 38   Left Hand Lateral Pinch 12 lbs   Left Hand 3 Point Pinch 12 lbs     Review with pt HEP :   Contrast in am  Tendon glides - gentle - not force or triggering    MC block splints for R 3rd and 4th to using during gripping act, sleeping  Can do ice massage over palm during day  Joint protection - about using larger joints And avoid tight and sustained grips- built objects up  discuss drawing, ,painting, holding leash , driving [place] at 3.3 MHZ, 20% and 1.0 intensity over A1pulleys in palm - pt report decrease pain                    OT Education - 05/25/17 1307    Education provided Yes   Education Details findings, and homeprogram review   Person(s) Educated Patient   Methods Explanation;Demonstration;Tactile cues;Verbal cues;Handout   Comprehension Verbalized understanding;Returned demonstration;Verbal cues required             OT Long Term Goals - 05/25/17 1320      OT LONG TERM GOAL #1   Title Pt to be ind in HEP to modify act, splinting and modalities to decrease triggering and pain in palm    Baseline no knowledge on how to decrease symptoms    Time 2   Period Weeks   Status New   Target Date 06/08/17     OT LONG TERM GOAL #2   Title Pt demo and verbalize  3 modifications to tasks to decrease triggering and pain in palm    Baseline no modifications knowledge   Time 3   Period Weeks   Status New   Target Date 06/15/17     OT LONG TERM GOAL #3   Title Pain or tenderness in palm decrease to 1/10 with  gripping act   Baseline pain at the moment with gripping 3/10    Time 4   Period Weeks   Status New   Target Date 06/22/17               Plan - 05/25/17 1309    Clinical Impression Statement Pt refer to OT for trigger fingers in R and L hand - started about 6-8 wks ago - pt tender over R hand 4th and 3rd A1pulleys - and report triggering after gripping , and tenderness in palm with  gripping - pt also has small cyst between 2nd and 3rd digit in webspace - trigger limiting her functional use of R hand more than L in ADL's and IADL;s    Occupational performance deficits (Please refer to evaluation for details): ADL's;IADL's;Play;Leisure   Rehab Potential Good   OT Frequency --  1-2 x wk   OT Duration 4 weeks   OT Treatment/Interventions Splinting;Patient/family education;Therapeutic exercises;Contrast Bath;Self-care/ADL training;Ultrasound;Iontophoresis;Manual Therapy   Plan Assess progress with HEP - adaptations to tasks and grip    Clinical Decision Making Limited treatment options, no task modification necessary   OT Home Exercise Plan See pt instruction   Consulted and Agree with Plan of Care Patient      Patient will benefit from skilled therapeutic intervention in order to improve the following deficits and impairments:  Impaired flexibility, Pain, Decreased knowledge of use of DME, Impaired UE functional use  Visit Diagnosis: Stiffness of right hand, not elsewhere classified - Plan: Ot plan of care cert/re-cert  Stiffness of left hand, not elsewhere classified - Plan: Ot plan of care cert/re-cert  Pain in right hand - Plan: Ot plan of care cert/re-cert  Trigger ring finger of right hand - Plan: Ot plan of care cert/re-cert  Scar condition and fibrosis of skin - Plan: Ot plan of care cert/re-cert      G-Codes - 62/83/15 1328    Functional Assessment Tool Used (Outpatient only) ROM , grip strength , pain , clinical judgment , manual assessment , ADL's assessment    Functional Limitation Self care   Self Care Current  Status [phone]) At least 1 percent but less than 20 percent impaired, limited or restricted   Self Care Goal Status (O4784) 0 percent impaired, limited or restricted      Problem List Patient Active Problem List   Diagnosis Date Noted  . Dizziness and giddiness 02/16/2015  . Counseling regarding end of life decision making 02/16/2015  . Acute bronchitis 07/10/2014  . Carotid stenosis, asymptomatic, minimal 05/30/2014  . Right shoulder pain 10/13/2013  . Pain, eye, right 10/13/2013  . Major depression in remission ([place]) 06/02/2013  . GERD (gastroesophageal reflux disease) 06/02/2013  . Allergic rhinitis 06/02/2013  . Palpitations 06/02/2013  . Mitral valve prolapse 06/02/2013  . HTN (hypertension) 06/02/2013  . High cholesterol 06/02/2013  . Personal history of colonic polyps 06/02/2013  . Osteoporosis, unspecified 06/02/2013  . Post herpetic neuralgia 06/02/2013    Rosalyn Gess OTR/L,CLT 05/25/2017, 1:32 PM  [place] [facility] [address], [place], [address] Phone: [phone]   Fax:  [phone]  Name: Marilyn Sharp MRN: [id number] Date of Birth: [birth year]

## 2017-05-27 ENCOUNTER — Ambulatory Visit: Payer: Medicare Other | Admitting: Occupational Therapy

## 2017-05-27 DIAGNOSIS — M25641 Stiffness of right hand, not elsewhere classified: Secondary | ICD-10-CM

## 2017-05-27 DIAGNOSIS — M65341 Trigger finger, right ring finger: Secondary | ICD-10-CM

## 2017-05-27 DIAGNOSIS — L905 Scar conditions and fibrosis of skin: Secondary | ICD-10-CM

## 2017-05-27 DIAGNOSIS — M79641 Pain in right hand: Secondary | ICD-10-CM

## 2017-05-27 DIAGNOSIS — M25642 Stiffness of left hand, not elsewhere classified: Secondary | ICD-10-CM

## 2017-05-27 NOTE — Therapy (Signed)
[place] [facility] [address], [place], [address] Phone: [phone]   Fax:  [phone]  Occupational Therapy Treatment  Patient Details  Name: Marilyn Sharp MRN: [id number] Date of Birth: 1935-08-30 Referring Provider: Rudene Christians  Encounter Date: 05/27/2017      OT End of Session - 05/27/17 1956    Visit Number 2   Number of Visits 8   Date for OT Re-Evaluation 06/22/17   OT Start Time 1005   OT Stop Time 1056   OT Time Calculation (min) 51 min   Activity Tolerance Patient tolerated treatment well   Behavior During Therapy [facility] for tasks assessed/performed      Past Medical History:  Diagnosis Date  . Allergy   . Anxiety   . Arthritis   . Chicken pox   . Depression   . Family history of polyps in the colon   . GERD (gastroesophageal reflux disease)   . Headache   . Heart murmur   . Heart palpitations   . Hyperlipidemia   . Hypertension   . Kidney stone   . Shingles 2012  . Stress fracture of lumbar vertebra     Past Surgical History:  Procedure Laterality Date  . bilateral blephoplasty  2001  . BREAST CYST ASPIRATION  2001  . BREAST SURGERY Left    benign cyst  . CATARACT EXTRACTION, BILATERAL  2007  . EYE SURGERY    . KYPHOPLASTY N/A 09/11/2015   Procedure: KYPHOPLASTY T12;  Surgeon: Hessie Knows, MD;  Location: [facility] ORS;  Service: Orthopedics;  Laterality: N/A;  . RHINOPLASTY      There were no vitals filed for this visit.      Subjective Assessment - 05/27/17 1952    Subjective  I have been wearing the little finger splints- maybe more than you told me - but the triggering is better - but still tender in palm and between index and middle    Patient Stated Goals Want my hand to get better - not trigger so I can paint, draw , open jars , more strength , and no pain in palm with driving    Currently in Pain? No/denies             Tenderness better at 4th - still tender in palm ,  3rd A1pulley and webspace of 2nd and 3rd            OT Treatments/Exercises (OP) - 05/27/17 0001      RUE Fluidotherapy   Number Minutes Fluidotherapy 10 Minutes   RUE Fluidotherapy Location Hand   Comments Gentle AROM for digits extentio and tendon glides - prior to manual      Soft tissue mobs done using graston tool nr 2 with brushing , vibration to decrease pain and tenderness Tendon glides - done - no triggering and AROM WNL  Padded dorsal side of MC block splints to decrease pressure   pt to wear only with sustained or tight grip  US done at end to decrease tenderness- 3.3MHZ, 50% .0.8 intensity for 5 min over 3rd A1pulley - and webspace of 2nd and 3rd   Discuss again modifying act - and use of leash with puppy class            OT Education - 05/27/17 1956    Education provided Yes   Education Details HEP   Person(s) Educated Patient   Methods Explanation;Demonstration;Tactile cues;Verbal cues   Comprehension Verbal cues required;Returned  demonstration;Verbalized understanding             OT Long Term Goals - 05/25/17 1320      OT LONG TERM GOAL #1   Title Pt to be ind in HEP to modify act, splinting and modalities to decrease triggering and pain in palm    Baseline no knowledge on how to decrease symptoms    Time 2   Period Weeks   Status New   Target Date 06/08/17     OT LONG TERM GOAL #2   Title Pt demo and verbalize  3 modifications to tasks to decrease triggering and pain in palm    Baseline no modifications knowledge   Time 3   Period Weeks   Status New   Target Date 06/15/17     OT LONG TERM GOAL #3   Title Pain or tenderness in palm decrease to 1/10 with gripping act   Baseline pain at the moment with gripping 3/10    Time 4   Period Weeks   Status New   Target Date 06/22/17               Plan - 05/27/17 1957    Clinical Impression Statement Pt tenderness over 4th A1pulley with less tenderness - still tender of 3rd and  cyst at 2nd /3rd webspace - less triggering - did not observe any in sessions - padded MC block splint more on dorsal side    Occupational performance deficits (Please refer to evaluation for details): ADL's;IADL's;Play;Leisure   Rehab Potential Good   OT Frequency 1x / week   OT Duration 4 weeks   OT Treatment/Interventions Splinting;Patient/family education;Therapeutic exercises;Contrast Bath;Self-care/ADL training;Ultrasound;Iontophoresis;Manual Therapy   Plan soft tissue , [place], decrease tenderness - and triggering    Clinical Decision Making Limited treatment options, no task modification necessary   OT Home Exercise Plan See pt instruction   Consulted and Agree with Plan of Care Patient      Patient will benefit from skilled therapeutic intervention in order to improve the following deficits and impairments:  Impaired flexibility, Pain, Decreased knowledge of use of DME, Impaired UE functional use  Visit Diagnosis: Stiffness of right hand, not elsewhere classified  Stiffness of left hand, not elsewhere classified  Trigger ring finger of right hand  Scar condition and fibrosis of skin  Pain in right hand    Problem List Patient Active Problem List   Diagnosis Date Noted  . Dizziness and giddiness 02/16/2015  . Counseling regarding end of life decision making 02/16/2015  . Acute bronchitis 07/10/2014  . Carotid stenosis, asymptomatic, minimal 05/30/2014  . Right shoulder pain 10/13/2013  . Pain, eye, right 10/13/2013  . Major depression in remission ([place]) 06/02/2013  . GERD (gastroesophageal reflux disease) 06/02/2013  . Allergic rhinitis 06/02/2013  . Palpitations 06/02/2013  . Mitral valve prolapse 06/02/2013  . HTN (hypertension) 06/02/2013  . High cholesterol 06/02/2013  . Personal history of colonic polyps 06/02/2013  . Osteoporosis, unspecified 06/02/2013  . Post herpetic neuralgia 06/02/2013    Rosalyn Gess OTR/L,CLT 05/27/2017, 8:02 PM  [place] [facility] [address] [place], [address] Phone: [phone]   Fax:  [phone]  Name: Marilyn Sharp MRN: [id number] Date of Birth: Dec 05, 1935

## 2017-05-27 NOTE — Patient Instructions (Signed)
Pt to cont with same HEP for [facility] block splint wearing - ice massage   contrast in am  Modify pick up or gripping of object- using palm , built up handles - avoid tight and sustained grip

## 2017-06-03 ENCOUNTER — Encounter: Payer: Medicare Other | Admitting: Occupational Therapy

## 2017-06-04 ENCOUNTER — Ambulatory Visit: Payer: Medicare Other | Admitting: Occupational Therapy

## 2017-06-04 ENCOUNTER — Encounter: Payer: Self-pay | Admitting: Occupational Therapy

## 2017-06-04 DIAGNOSIS — L905 Scar conditions and fibrosis of skin: Secondary | ICD-10-CM

## 2017-06-04 DIAGNOSIS — M25641 Stiffness of right hand, not elsewhere classified: Secondary | ICD-10-CM

## 2017-06-04 DIAGNOSIS — M79641 Pain in right hand: Secondary | ICD-10-CM

## 2017-06-04 DIAGNOSIS — M65341 Trigger finger, right ring finger: Secondary | ICD-10-CM

## 2017-06-04 NOTE — Therapy (Signed)
[place] [facility] [address] [place], [address] Phone: [phone]   Fax:  [phone]  Occupational Therapy Treatment  Patient Details  Name: Marilyn Sharp MRN: [id number] Date of Birth: 04/08/1936 Referring Provider: Rudene Christians  Encounter Date: 06/04/2017      OT End of Session - 06/04/17 1238    Visit Number 3   Number of Visits 8   Date for OT Re-Evaluation 06/22/17   OT Start Time 1030   OT Stop Time 1115   OT Time Calculation (min) 45 min   Activity Tolerance Patient tolerated treatment well   Behavior During Therapy [facility] for tasks assessed/performed      Past Medical History:  Diagnosis Date  . Allergy   . Anxiety   . Arthritis   . Chicken pox   . Depression   . Family history of polyps in the colon   . GERD (gastroesophageal reflux disease)   . Headache   . Heart murmur   . Heart palpitations   . Hyperlipidemia   . Hypertension   . Kidney stone   . Shingles 2012  . Stress fracture of lumbar vertebra     Past Surgical History:  Procedure Laterality Date  . bilateral blephoplasty  2001  . BREAST CYST ASPIRATION  2001  . BREAST SURGERY Left    benign cyst  . CATARACT EXTRACTION, BILATERAL  2007  . EYE SURGERY    . KYPHOPLASTY N/A 09/11/2015   Procedure: KYPHOPLASTY T12;  Surgeon: Hessie Knows, MD;  Location: [facility] ORS;  Service: Orthopedics;  Laterality: N/A;  . RHINOPLASTY      There were no vitals filed for this visit.      Subjective Assessment - 06/04/17 1035    Subjective  Pt reports that she is not using splints all the time and attempts at modifying leash have been unsuccessful.   Patient Stated Goals Want my hand to get better - not trigger so I can paint, draw , open jars , more strength , and no pain in palm with driving    Currently in Pain? No/denies   Multiple Pain Sites No                      OT Treatments/Exercises (OP) - 06/04/17 0001      ADLs   ADL Comments Discussed ADL modifications: Pt reports inability to modifiy dog leash as "it hasn't helped and it hurts" when wearing splints and walking dog etc. Pt educated that built up handles should aloow for decreased stress on R MF/RF and therefore should also have decreased triggering/pain. Pt may benefit from hand based splint for activity if she is unable to tolerate finger based splints - she verbalized understanding of this.      Exercises   Exercises Hand  Reveiwed Home program for R hand     Fine Motor Coordination   Other Fine Motor Exercises Reviewed home program and ex's     Modalities   Modalities Ultrasound;Fluidotherapy     Ultrasound   Ultrasound Location 3rd A1 Pulley & 2nd & 3 rd webspaces   Ultrasound Parameters 3.3MHA @ 50% 0.08  intensity for 5 min    Ultrasound Goals Pain;Other (Comment)  Increase circulation, flexibility     RUE Fluidotherapy   Number Minutes Fluidotherapy 10 Minutes   RUE Fluidotherapy Location Hand;Wrist   Comments For gentle A/ROM for digits and tendon gliding prior to manual therapy.  Splinting   Splinting Splint check and modification - addedd padding into volar palm of hand (both splints); replaced velcro piece on MF splint and modified dorsal area of bilateral splints to assist with decreased pressure when wearing during functional use.     Manual Therapy   Manual Therapy Soft tissue mobilization  STM L hand,  2nd/3rd webspaces and volar palm of hand    Soft tissue mobilization To assist with increased flexibility, decreased pain & tenderness. Pt verbalizes increased flexibility after performeance in clinic today x10 min                OT Education - 06/04/17 1236    Education provided Yes   Education Details HEP, built up handles or increased surface area of objects should allow for decreased triggering and decreased pain. ?Pt may need to consider hand based splint if unable to tolerate finger based splints with  adjustments that were made today.   Person(s) Educated Patient   Methods Explanation;Demonstration;Verbal cues;Tactile cues   Comprehension Verbalized understanding;Returned demonstration;Need further instruction             OT Long Term Goals - 05/25/17 1320      OT LONG TERM GOAL #1   Title Pt to be ind in HEP to modify act, splinting and modalities to decrease triggering and pain in palm    Baseline no knowledge on how to decrease symptoms    Time 2   Period Weeks   Status New   Target Date 06/08/17     OT LONG TERM GOAL #2   Title Pt demo and verbalize  3 modifications to tasks to decrease triggering and pain in palm    Baseline no modifications knowledge   Time 3   Period Weeks   Status New   Target Date 06/15/17     OT LONG TERM GOAL #3   Title Pain or tenderness in palm decrease to 1/10 with gripping act   Baseline pain at the moment with gripping 3/10    Time 4   Period Weeks   Status New   Target Date 06/22/17               Plan - 06/04/17 1238    Clinical Impression Statement Pt continues to report difficulty/pain when doing daily activities like walking her dog. She states that splints are uncomfortable and press on tender areas during functional activity. Splints were modified today and it was discussed that she may benefit from hand based splint during activities if she is not able to tolerate finger based splints. Pt reports increased flexibility and decreased tenderness after treatment session today in clinic.   Occupational performance deficits (Please refer to evaluation for details): ADL's;IADL's;Play;Leisure   Rehab Potential Good   OT Frequency 1x / week   OT Duration 4 weeks   OT Treatment/Interventions Splinting;Patient/family education;Therapeutic exercises;Contrast Bath;Self-care/ADL training;Ultrasound;Iontophoresis;Manual Therapy   Plan Splint check (?Consider hand based splint if unable to tolerate finger based splints), soft tissue  mobs, [place] and adaptive functional activity.   Clinical Decision Making Limited treatment options, no task modification necessary   OT Home Exercise Plan See pt instruction   Consulted and Agree with Plan of Care Patient      Patient will benefit from skilled therapeutic intervention in order to improve the following deficits and impairments:  Impaired flexibility, Pain, Decreased knowledge of use of DME, Impaired UE functional use  Visit Diagnosis: Stiffness of right hand, not elsewhere classified  Trigger ring  finger of right hand  Scar condition and fibrosis of skin  Pain in right hand    Problem List Patient Active Problem List   Diagnosis Date Noted  . Dizziness and giddiness 02/16/2015  . Counseling regarding end of life decision making 02/16/2015  . Acute bronchitis 07/10/2014  . Carotid stenosis, asymptomatic, minimal 05/30/2014  . Right shoulder pain 10/13/2013  . Pain, eye, right 10/13/2013  . Major depression in remission ([place]) 06/02/2013  . GERD (gastroesophageal reflux disease) 06/02/2013  . Allergic rhinitis 06/02/2013  . Palpitations 06/02/2013  . Mitral valve prolapse 06/02/2013  . HTN (hypertension) 06/02/2013  . High cholesterol 06/02/2013  . Personal history of colonic polyps 06/02/2013  . Osteoporosis, unspecified 06/02/2013  . Post herpetic neuralgia 06/02/2013    Percell Miller Ardath Sax, OTR/L 06/04/2017, 12:43 PM  [place] [facility] [address], [place], [address] Phone: [phone]   Fax:  [phone]  Name: Marilyn Sharp MRN: [id number] Date of Birth: 1936-04-17

## 2017-06-10 ENCOUNTER — Ambulatory Visit: Payer: Medicare Other | Admitting: Occupational Therapy

## 2017-06-10 DIAGNOSIS — M65341 Trigger finger, right ring finger: Secondary | ICD-10-CM

## 2017-06-10 DIAGNOSIS — M25641 Stiffness of right hand, not elsewhere classified: Secondary | ICD-10-CM

## 2017-06-10 DIAGNOSIS — L905 Scar conditions and fibrosis of skin: Secondary | ICD-10-CM

## 2017-06-10 DIAGNOSIS — M25642 Stiffness of left hand, not elsewhere classified: Secondary | ICD-10-CM

## 2017-06-10 DIAGNOSIS — M79641 Pain in right hand: Secondary | ICD-10-CM

## 2017-06-10 NOTE — Therapy (Signed)
[facility] [address], [place], [address] Phone: [phone]   Fax:  [phone]  Occupational Therapy Treatment  Patient Details  Name: Marilyn Sharp MRN: [id number] Date of Birth: [birth year] Referring Provider: Rudene Christians  Encounter Date: 06/10/2017      OT End of Session - 06/10/17 1509    Visit Number 4   Number of Visits 8   Date for OT Re-Evaluation 06/22/17   OT Start Time 1049   OT Stop Time 1140   OT Time Calculation (min) 51 min   Activity Tolerance Patient tolerated treatment well   Behavior During Therapy [facility] for tasks assessed/performed      Past Medical History:  Diagnosis Date  . Allergy   . Anxiety   . Arthritis   . Chicken pox   . Depression   . Family history of polyps in the colon   . GERD (gastroesophageal reflux disease)   . Headache   . Heart murmur   . Heart palpitations   . Hyperlipidemia   . Hypertension   . Kidney stone   . Shingles 2012  . Stress fracture of lumbar vertebra     Past Surgical History:  Procedure Laterality Date  . bilateral blephoplasty  2001  . BREAST CYST ASPIRATION  2001  . BREAST SURGERY Left    benign cyst  . CATARACT EXTRACTION, BILATERAL  2007  . EYE SURGERY    . KYPHOPLASTY N/A 09/11/2015   Procedure: KYPHOPLASTY T12;  Surgeon: Hessie Knows, MD;  Location: [facility] ORS;  Service: Orthopedics;  Laterality: N/A;  . RHINOPLASTY      There were no vitals filed for this visit.      Subjective Assessment - 06/10/17 1101    Subjective  I am only wearing the ring splints when walking the dog, driving and night time - they not bad- my middle finger not triggering and those 2 spots at base of index and palm felt softer and smaller - ring finger still triggering some but not tender    Patient Stated Goals Want my hand to get better - not trigger so I can paint, draw , open jars , more strength , and no pain in palm with driving    Currently in  Pain? No/denies                      OT Treatments/Exercises (OP) - 06/10/17 0001      Ultrasound   Ultrasound Location 4th and 3rd A1pulley- and palm    Ultrasound Parameters 3.3MHZ, 20%, .8 intensity    Ultrasound Goals Pain;Other (Comment)     RUE Fluidotherapy   Number Minutes Fluidotherapy 10 Minutes   RUE Fluidotherapy Location Hand   Comments For gentle AROM to increase ROM - prior to soft itssue       Soft tissue mobs done using graston tool nr 2 with brushing , vibration to decrease pain and tenderness over tender area in palm , and between 2nd and 3rd webspace  Not tenderness over A1pulley at 3rd and 4th - but still triggering report at 4th  Tendon glides - done - no triggering and AROM WNL  Padded dorsal side of MC block splints to decrease pressure after cutting down 4th to allow more flexion of PIP   pt to wear only with sustained or tight grip  US done at end to decrease tenderness- 3.3MHZ, 20% .0.8 intensity for 5 min over 3rd  A1pulley - and webspace of 2nd and 3rd   Discuss again modifying act - and use of leash with puppy class             OT Education - 06/10/17 1508    Education provided Yes   Education Details HEP same to cont with    Person(s) Educated Patient   Methods Explanation;Demonstration;Tactile cues;Verbal cues   Comprehension Returned demonstration;Verbalized understanding             OT Long Term Goals - 05/25/17 1320      OT LONG TERM GOAL #1   Title Pt to be ind in HEP to modify act, splinting and modalities to decrease triggering and pain in palm    Baseline no knowledge on how to decrease symptoms    Time 2   Period Weeks   Status New   Target Date 06/08/17     OT LONG TERM GOAL #2   Title Pt demo and verbalize  3 modifications to tasks to decrease triggering and pain in palm    Baseline no modifications knowledge   Time 3   Period Weeks   Status New   Target Date 06/15/17     OT LONG TERM GOAL #3    Title Pain or tenderness in palm decrease to 1/10 with gripping act   Baseline pain at the moment with gripping 3/10    Time 4   Period Weeks   Status New   Target Date 06/22/17               Plan - 06/10/17 1509    Clinical Impression Statement Modify MC block splint for 4th  - cutting it down to  allow more PIP flexion - no triggering in 3rd and not tenderness over A1pulley at 3rd and 4th- but still in palm - area at webspace of 2nd and 3rd - softer and  not tender anymore - 4th still ones to trigger per pt - worse in am    Occupational performance deficits (Please refer to evaluation for details): IADL's;ADL's;Play;Leisure   Rehab Potential Good   OT Frequency 1x / week   OT Duration 4 weeks   OT Treatment/Interventions Splinting;Patient/family education;Therapeutic exercises;Contrast Bath;Self-care/ADL training;Ultrasound;Iontophoresis;Manual Therapy   Plan assess if maintain decrease pain after soft tissue mobs - and decrease triggering in 4th ?   Clinical Decision Making Limited treatment options, no task modification necessary   OT Home Exercise Plan See pt instruction   Consulted and Agree with Plan of Care Patient      Patient will benefit from skilled therapeutic intervention in order to improve the following deficits and impairments:  Impaired flexibility, Pain, Decreased knowledge of use of DME, Impaired UE functional use  Visit Diagnosis: Stiffness of right hand, not elsewhere classified  Trigger ring finger of right hand  Scar condition and fibrosis of skin  Pain in right hand  Stiffness of left hand, not elsewhere classified    Problem List Patient Active Problem List   Diagnosis Date Noted  . Dizziness and giddiness 02/16/2015  . Counseling regarding end of life decision making 02/16/2015  . Acute bronchitis 07/10/2014  . Carotid stenosis, asymptomatic, minimal 05/30/2014  . Right shoulder pain 10/13/2013  . Pain, eye, right 10/13/2013  . Major  depression in remission ([place]) 06/02/2013  . GERD (gastroesophageal reflux disease) 06/02/2013  . Allergic rhinitis 06/02/2013  . Palpitations 06/02/2013  . Mitral valve prolapse 06/02/2013  . HTN (hypertension) 06/02/2013  . High cholesterol 06/02/2013  .  Personal history of colonic polyps 06/02/2013  . Osteoporosis, unspecified 06/02/2013  . Post herpetic neuralgia 06/02/2013    Rosalyn Gess OTR/L,CLT 06/10/2017, 3:12 PM  [place] [address], [place], [address] Phone: [phone]   Fax:  [phone]  Name: Marilyn Sharp MRN: [id number] Date of Birth: Aug 25, 1935

## 2017-06-10 NOTE — Patient Instructions (Signed)
Same HEP  

## 2017-06-17 ENCOUNTER — Encounter: Payer: Medicare Other | Admitting: Occupational Therapy

## 2017-06-19 ENCOUNTER — Ambulatory Visit: Payer: Medicare Other | Attending: Orthopedic Surgery | Admitting: Occupational Therapy

## 2017-06-19 DIAGNOSIS — M25641 Stiffness of right hand, not elsewhere classified: Secondary | ICD-10-CM

## 2017-06-19 DIAGNOSIS — M65341 Trigger finger, right ring finger: Secondary | ICD-10-CM

## 2017-06-19 DIAGNOSIS — M25642 Stiffness of left hand, not elsewhere classified: Secondary | ICD-10-CM

## 2017-06-19 DIAGNOSIS — M79641 Pain in right hand: Secondary | ICD-10-CM | POA: Diagnosis present

## 2017-06-19 DIAGNOSIS — L905 Scar conditions and fibrosis of skin: Secondary | ICD-10-CM | POA: Diagnosis present

## 2017-06-19 NOTE — Patient Instructions (Signed)
Same - but do not wear MC block splint on 3rd any more

## 2017-06-19 NOTE — Therapy (Signed)
[facility] [address] [place], [address] Phone: [phone]   Fax:  [phone]  Occupational Therapy Treatment  Patient Details  Name: Marilyn Sharp MRN: [id number] Date of Birth: [birth year] Referring Provider: Rudene Christians   Encounter Date: 06/19/2017  OT End of Session - 06/19/17 1243    Visit Number  5    Number of Visits  8    Date for OT Re-Evaluation  06/22/17    OT Start Time  1050    OT Stop Time  1137    OT Time Calculation (min)  47 min    Activity Tolerance  Patient tolerated treatment well    Behavior During Therapy  [facility] for tasks assessed/performed       Past Medical History:  Diagnosis Date  . Allergy   . Anxiety   . Arthritis   . Chicken pox   . Depression   . Family history of polyps in the colon   . GERD (gastroesophageal reflux disease)   . Headache   . Heart murmur   . Heart palpitations   . Hyperlipidemia   . Hypertension   . Kidney stone   . Shingles 2012  . Stress fracture of lumbar vertebra     Past Surgical History:  Procedure Laterality Date  . bilateral blephoplasty  2001  . BREAST CYST ASPIRATION  2001  . BREAST SURGERY Left    benign cyst  . CATARACT EXTRACTION, BILATERAL  2007  . EYE SURGERY    . RHINOPLASTY      There were no vitals filed for this visit.  Subjective Assessment - 06/19/17 1238    Subjective   Doing okay - still little tender over that spot in palm and my ring finger trigger - but not bad and about 3-4 x day - but the spot at my index finger getting smaller  and middle finger  not bother me     Patient Stated Goals  Want my hand to get better - not trigger so I can paint, draw , open jars , more strength , and no pain in palm with driving     Currently in Pain?  No/denies                   OT Treatments/Exercises (OP) - 06/19/17 0001      Ultrasound   Ultrasound Location  A1pulley of 3rd adn 4th - palm and webspace of 2 and  3rd     Ultrasound Parameters  3.3MHZ 20% ,  .8 intensity for 5 min     Ultrasound Goals  Pain;Other (Comment)      RUE Paraffin   Number Minutes Paraffin  10 Minutes    RUE Paraffin Location  Hand    Comments  prior to soft tissue        Soft tissue mobs done using graston tool nr 2 with brushing , vibration to decrease pain and tenderness over tender area in palm , and between 2nd and 3rd webspace  Not tenderness over A1pulley at 3rd and 4th - but still triggering report at 4th  Tendon glides - done - no triggering in clinic and AROM WNL   pt to wear only with sustained or tight grip the 4th MC block splint  US done at end to decrease tenderness- 3.3MHZ, 20% .0.8 intensity for 5 min over 3rd A1pulley - and webspace of 2nd and 3rd  And palm   Discuss again  modifying act - and use of leash with puppy class         OT Education - 06/19/17 1242    Education provided  Yes    Education Details  HEP and discharge 3rd MC block splint     Person(s) Educated  Patient    Methods  Explanation;Demonstration;Tactile cues;Verbal cues    Comprehension  Returned demonstration;Verbalized understanding          OT Long Term Goals - 05/25/17 1320      OT LONG TERM GOAL #1   Title  Pt to be ind in HEP to modify act, splinting and modalities to decrease triggering and pain in palm     Baseline  no knowledge on how to decrease symptoms     Time  2    Period  Weeks    Status  New    Target Date  06/08/17      OT LONG TERM GOAL #2   Title  Pt demo and verbalize  3 modifications to tasks to decrease triggering and pain in palm     Baseline  no modifications knowledge    Time  3    Period  Weeks    Status  New    Target Date  06/15/17      OT LONG TERM GOAL #3   Title  Pain or tenderness in palm decrease to 1/10 with gripping act    Baseline  pain at the moment with gripping 3/10     Time  4    Period  Weeks    Status  New    Target Date  06/22/17            Plan -  06/19/17 1244    Clinical Impression Statement  Pt show decrease triggering in 3rd , decrease tenderness over webspace at 2nd and 3rd - 4th still triggering about 3-4 x day - and tender over 3rd tendon inpalm- pt to stop wearing MC splint on 3rd and soft tissue on palm - to assess if tenderness getting better     Occupational performance deficits (Please refer to evaluation for details):  ADL's;IADL's;Play;Leisure    Rehab Potential  Good    OT Frequency  1x / week    OT Duration  2 weeks    OT Treatment/Interventions  Splinting;Patient/family education;Therapeutic exercises;Contrast Bath;Self-care/ADL training;Ultrasound;Iontophoresis;Manual Therapy    Plan  assess pain and triggering     Clinical Decision Making  Limited treatment options, no task modification necessary    OT Home Exercise Plan  See pt instruction    Consulted and Agree with Plan of Care  Patient       Patient will benefit from skilled therapeutic intervention in order to improve the following deficits and impairments:  Impaired flexibility, Pain, Decreased knowledge of use of DME, Impaired UE functional use  Visit Diagnosis: Stiffness of right hand, not elsewhere classified  Trigger ring finger of right hand  Scar condition and fibrosis of skin  Pain in right hand  Stiffness of left hand, not elsewhere classified    Problem List Patient Active Problem List   Diagnosis Date Noted  . Dizziness and giddiness 02/16/2015  . Counseling regarding end of life decision making 02/16/2015  . Acute bronchitis 07/10/2014  . Carotid stenosis, asymptomatic, minimal 05/30/2014  . Right shoulder pain 10/13/2013  . Pain, eye, right 10/13/2013  . Major depression in remission ([place]) 06/02/2013  . GERD (gastroesophageal reflux disease) 06/02/2013  . Allergic rhinitis 06/02/2013  .  Palpitations 06/02/2013  . Mitral valve prolapse 06/02/2013  . HTN (hypertension) 06/02/2013  . High cholesterol 06/02/2013  . Personal history  of colonic polyps 06/02/2013  . Osteoporosis, unspecified 06/02/2013  . Post herpetic neuralgia 06/02/2013    Rosalyn Gess OTR/L,CLT 06/19/2017, 1:15 PM  [place] [address] [place], [address] Phone: [phone]   Fax:  [phone]  Name: Marilyn Sharp MRN: [id number] Date of Birth: [birth year]

## 2017-06-26 ENCOUNTER — Ambulatory Visit: Payer: Medicare Other | Admitting: Occupational Therapy

## 2017-06-26 DIAGNOSIS — M25641 Stiffness of right hand, not elsewhere classified: Secondary | ICD-10-CM | POA: Diagnosis not present

## 2017-06-26 DIAGNOSIS — M79641 Pain in right hand: Secondary | ICD-10-CM

## 2017-06-26 DIAGNOSIS — M65341 Trigger finger, right ring finger: Secondary | ICD-10-CM

## 2017-06-26 DIAGNOSIS — M25642 Stiffness of left hand, not elsewhere classified: Secondary | ICD-10-CM

## 2017-06-26 DIAGNOSIS — L905 Scar conditions and fibrosis of skin: Secondary | ICD-10-CM

## 2017-06-26 NOTE — Therapy (Signed)
[facility] [address], [place], [address] Phone: [phone]   Fax:  [phone]  Occupational Therapy Treatment  Patient Details  Name: Marilyn Sharp MRN: [id number] Date of Birth: 06-05-1936 Referring Provider: Rudene Christians   Encounter Date: 06/26/2017  OT End of Session - 06/26/17 1353    Visit Number  6    Number of Visits  6    Date for OT Re-Evaluation  06/26/17    OT Start Time  1250    OT Stop Time  1317    OT Time Calculation (min)  27 min    Activity Tolerance  Patient tolerated treatment well    Behavior During Therapy  [facility] for tasks assessed/performed       Past Medical History:  Diagnosis Date  . Allergy   . Anxiety   . Arthritis   . Chicken pox   . Depression   . Family history of polyps in the colon   . GERD (gastroesophageal reflux disease)   . Headache   . Heart murmur   . Heart palpitations   . Hyperlipidemia   . Hypertension   . Kidney stone   . Shingles 2012  . Stress fracture of lumbar vertebra     Past Surgical History:  Procedure Laterality Date  . bilateral blephoplasty  2001  . BREAST CYST ASPIRATION  2001  . BREAST SURGERY Left    benign cyst  . CATARACT EXTRACTION, BILATERAL  2007  . EYE SURGERY    . KYPHOPLASTY T12 N/A 09/11/2015   Performed by Hessie Knows, MD at [facility]  . RHINOPLASTY      There were no vitals filed for this visit.  Subjective Assessment - 06/26/17 1351    Subjective   That area in my palm is softer and not as tender , and base of my index the same- the ring finger still ones to trigger some - but still wearing that little ring splint on ring finger at night time and  with  driving - using dog leash on L hand     Patient Stated Goals  Want my hand to get better - not trigger so I can paint, draw , open jars , more strength , and no pain in palm with driving     Currently in Pain?  No/denies       AROM in R and L hand digits WNL   [facility]  OT Assessment - 06/26/17 0001      Strength   Right Hand Grip (lbs)  41    Right Hand Lateral Pinch  13 lbs    Right Hand 3 Point Pinch  12 lbs    Left Hand Grip (lbs)  35    Left Hand Lateral Pinch  12 lbs    Left Hand 3 Point Pinch  12 lbs      Assess tenderness and soft tissue in palm and webspace of 2nd and 3rd          OT Treatments/Exercises (OP) - 06/26/17 0001      RUE Paraffin   Number Minutes Paraffin  10 Minutes    RUE Paraffin Location  Hand    Comments  prior to soft tissue  to decrease tenderness       Graston tool nr 2 brushing over A1pulleys of 2nd thru 5th  Vibration over palm and base of 2nd and 3rd webspace -  Pt report still triggering at times in  4th - but did not notice in clinic and not tender over A1pulley at 4th  Pt to stop wearing MC block splint on 4th too - and if  Cont to have issues in 2 wks make appt with Dr Rudene Christians        OT Education - 06/26/17 1352    Education provided  Yes    Education Details  Discharge [place] block splint - avoid tight and sustained grip    Person(s) Educated  Patient    Methods  Explanation    Comprehension  Verbalized understanding          OT Long Term Goals - 06/26/17 1357      OT LONG TERM GOAL #1   Title  Pt to be ind in HEP to modify act, splinting and modalities to decrease triggering and pain in palm     Baseline  pt report still some triggering in ring finger - but not consistant     Status  Partially Met      OT LONG TERM GOAL #2   Title  Pt demo and verbalize  3 modifications to tasks to decrease triggering and pain in palm     Status  Achieved      OT LONG TERM GOAL #3   Title  Pain or tenderness in palm decrease to 1/10 with gripping act    Status  Achieved            Plan - 06/26/17 1354    Clinical Impression Statement  Pt  R hand AROM in digits WNL, grip and prehension strength WNL - pt do still report some triggering at times in 4th digit - but not tenderness over A1pulley at  3rd or 4th - cyst at webspace of 2nd and 3rd not tender anymore - still area in palm that can be tender at times- pt to stop homeprogram for 2 wks and if cont to have issues  with R hand - make appt for follow up with Dr Rudene Christians     OT Treatment/Interventions  Splinting;Patient/family education;Therapeutic exercises;Contrast Bath;Self-care/ADL training;Ultrasound;Iontophoresis;Manual Therapy    Plan  Pt to contact DR Rudene Christians if still issues in 2 wks     Clinical Decision Making  Limited treatment options, no task modification necessary    OT Home Exercise Plan  See pt instruction    Consulted and Agree with Plan of Care  Patient       Patient will benefit from skilled therapeutic intervention in order to improve the following deficits and impairments:     Visit Diagnosis: Stiffness of right hand, not elsewhere classified  Trigger ring finger of right hand  Scar condition and fibrosis of skin  Pain in right hand  Stiffness of left hand, not elsewhere classified    Problem List Patient Active Problem List   Diagnosis Date Noted  . Dizziness and giddiness 02/16/2015  . Counseling regarding end of life decision making 02/16/2015  . Acute bronchitis 07/10/2014  . Carotid stenosis, asymptomatic, minimal 05/30/2014  . Right shoulder pain 10/13/2013  . Pain, eye, right 10/13/2013  . Major depression in remission ([place]) 06/02/2013  . GERD (gastroesophageal reflux disease) 06/02/2013  . Allergic rhinitis 06/02/2013  . Palpitations 06/02/2013  . Mitral valve prolapse 06/02/2013  . HTN (hypertension) 06/02/2013  . High cholesterol 06/02/2013  . Personal history of colonic polyps 06/02/2013  . Osteoporosis, unspecified 06/02/2013  . Post herpetic neuralgia 06/02/2013    Rosalyn Gess OTR/L,CLT  06/26/2017, 1:59 PM  [facility] [address], [place], [address] Phone: [phone]   Fax:  [phone]  Name:  Marilyn Sharp MRN: [id number] Date of Birth: 08/06/1936

## 2017-06-26 NOTE — Patient Instructions (Signed)
Stop wearing ring splint for MC block on 4th  Stop ROM and massage   if still issues in 2 wks contact Dr Rudene Christians

## 2017-09-15 ENCOUNTER — Other Ambulatory Visit: Payer: Self-pay | Admitting: Neurology

## 2017-09-15 DIAGNOSIS — R2689 Other abnormalities of gait and mobility: Secondary | ICD-10-CM

## 2017-09-23 ENCOUNTER — Ambulatory Visit
Admission: RE | Admit: 2017-09-23 | Discharge: 2017-09-23 | Disposition: A | Discharge status: Home / Self Care | Payer: Medicare Other | Source: Ambulatory Visit | Attending: Neurology | Admitting: Neurology

## 2017-09-23 DIAGNOSIS — R2689 Other abnormalities of gait and mobility: Secondary | ICD-10-CM | POA: Insufficient documentation

## 2017-09-23 DIAGNOSIS — G319 Degenerative disease of nervous system, unspecified: Secondary | ICD-10-CM | POA: Insufficient documentation

## 2017-09-23 DIAGNOSIS — R251 Tremor, unspecified: Secondary | ICD-10-CM | POA: Insufficient documentation

## 2017-11-11 ENCOUNTER — Other Ambulatory Visit: Payer: Self-pay | Admitting: Internal Medicine

## 2017-11-11 DIAGNOSIS — Z1231 Encounter for screening mammogram for malignant neoplasm of breast: Secondary | ICD-10-CM

## 2017-12-14 ENCOUNTER — Ambulatory Visit
Admission: RE | Admit: 2017-12-14 | Discharge: 2017-12-14 | Disposition: A | Discharge status: Home / Self Care | Payer: Medicare Other | Source: Ambulatory Visit | Attending: Internal Medicine | Admitting: Internal Medicine

## 2017-12-14 DIAGNOSIS — Z1231 Encounter for screening mammogram for malignant neoplasm of breast: Secondary | ICD-10-CM | POA: Diagnosis not present

## 2018-01-05 DIAGNOSIS — R251 Tremor, unspecified: Secondary | ICD-10-CM | POA: Insufficient documentation

## 2018-03-23 DIAGNOSIS — R195 Other fecal abnormalities: Secondary | ICD-10-CM | POA: Insufficient documentation

## 2018-06-16 ENCOUNTER — Ambulatory Visit: Admission: RE | Admit: 2018-06-16 | Payer: Medicare Other | Source: Ambulatory Visit | Admitting: Internal Medicine

## 2018-06-16 ENCOUNTER — Encounter: Admission: RE | Payer: Self-pay | Source: Ambulatory Visit

## 2018-06-16 SURGERY — EGD (ESOPHAGOGASTRODUODENOSCOPY)
Anesthesia: General

## 2018-11-11 ENCOUNTER — Encounter: Payer: Medicare Other | Admitting: Oncology

## 2018-12-18 DIAGNOSIS — R803 Bence Jones proteinuria: Secondary | ICD-10-CM | POA: Insufficient documentation

## 2018-12-18 NOTE — Progress Notes (Deleted)
[place]  Telephone:([phone] [phone]  HEMATOLOGY-ONCOLOGY TELEMEDICINE VISIT PROGRESS NOTE  ID: Beatriz Chancellor Shifflet OB: 07-28-1936  MR#: [id number]  UVO#:[id number]  Patient Care Team: Idelle Crouch, MD as PCP - General (Internal Medicine)  I connected with@ on 12/18/18 at 10:00 AM EDT by {Blank single:19197::"video enabled telemedicine visit","telephone visit"} and verified that I am speaking with the correct person using two identifiers.   I discussed the limitations, risks, security and privacy concerns of performing an evaluation and management service by telemedicine and the availability of in-person appointments. I also discussed with the patient that there may be a patient responsible charge related to this service. The patient expressed understanding and agreed to proceed.  Other persons participating in the visit and their role in the encounter:  ***  PATIENT'S LOCATION:  *** PROVIDER'S LOCATION:  ***  CHIEF COMPLAINT: Bence-Jones proteinuria.  INTERVAL HISTORY: ***  REVIEW OF SYSTEMS:   ROS  As per HPI. Otherwise, a complete review of systems is negative.  PAST MEDICAL HISTORY: Past Medical History:  Diagnosis Date  . Allergy   . Anxiety   . Arthritis   . Chicken pox   . Depression   . Family history of polyps in the colon   . GERD (gastroesophageal reflux disease)   . Headache   . Heart murmur   . Heart palpitations   . Hyperlipidemia   . Hypertension   . Kidney stone   . Shingles 2012  . Stress fracture of lumbar vertebra     PAST SURGICAL HISTORY: Past Surgical History:  Procedure Laterality Date  . bilateral blephoplasty  2001  . BREAST CYST ASPIRATION  2001  . BREAST SURGERY Left    benign cyst  . CATARACT EXTRACTION, BILATERAL  2007  . EYE SURGERY    . KYPHOPLASTY N/A 09/11/2015   Procedure: KYPHOPLASTY T12;  Surgeon: Hessie Knows, MD;  Location: [facility] ORS;  Service: Orthopedics;  Laterality: N/A;  .  RHINOPLASTY      FAMILY HISTORY: Family History  Problem Relation Age of Onset  . Arthritis Mother   . Hypertension Mother   . Osteoporosis Mother   . COPD Father   . Arthritis Maternal Grandmother   . Heart disease Maternal Grandfather   . Colon cancer Neg Hx   . Breast cancer Neg Hx     ADVANCED DIRECTIVES (Y/N):  N  HEALTH MAINTENANCE: Social History   Tobacco Use  . Smoking status: Former Smoker    Packs/day: 1.00    Years: 20.00    Pack years: 20.00    Types: Cigarettes    Last attempt to quit: 08/11/1973    Years since quitting: 45.3  . Smokeless tobacco: Never Used  Substance Use Topics  . Alcohol use: Yes    Alcohol/week: 2.0 standard drinks    Types: 2 Glasses of wine per week  . Drug use: No     Colonoscopy:  PAP:  Bone density:  Lipid panel:  Allergies  Allergen Reactions  . Amlodipine Besylate Other (See Comments)    "dizzy"  . Ciprofloxacin Hives  . Codeine Itching  . Septra [Sulfamethoxazole-Trimethoprim] Itching    Current Outpatient Medications  Medication Sig Dispense Refill  . aspirin 325 MG tablet Take 325 mg by mouth as needed.     . Cholecalciferol (VITAMIN D-3) 1000 UNITS CAPS Take 1 capsule by mouth daily.    . cyclobenzaprine (FLEXERIL) 10 MG tablet Take 10 mg by mouth 2 (two) times daily.    Marland Kitchen  diphenoxylate-atropine (LOMOTIL) 2.5-0.025 MG per tablet Take 2 tablets by mouth as needed for diarrhea or loose stools. Reported on 09/11/2015    . gabapentin (NEURONTIN) 300 MG capsule Take 300 mg by mouth as needed. Reported on 09/11/2015    . HYDROcodone-acetaminophen (NORCO) 5-325 MG tablet Take 1 tablet by mouth every 6 (six) hours as needed for moderate pain. 30 tablet 0  . loratadine (CLARITIN) 10 MG tablet Take 10 mg by mouth daily as needed. Reported on 09/11/2015    . losartan (COZAAR) 100 MG tablet TAKE 1 TABLET BY MOUTH DAILY 90 tablet 1  . metoprolol succinate (TOPROL-XL) 25 MG 24 hr tablet Take 25 mg by mouth daily.    . Omega-3  Fatty Acids (FISH OIL) 1200 MG CAPS Take 1 capsule by mouth daily.    . Probiotic Product (PROBIOTIC DAILY) CAPS Take 1 capsule by mouth daily.    . traMADol (ULTRAM) 50 MG tablet Take 50 mg by mouth every 6 (six) hours as needed.     No current facility-administered medications for this visit.     OBJECTIVE: There were no vitals filed for this visit.   There is no height or weight on file to calculate BMI.    ECOG FS:{CHL ONC [id number]  General: Well-developed, well-nourished, no acute distress. Eyes: Pink conjunctiva, anicteric sclera. HEENT: Normocephalic, moist mucous membranes, clear oropharnyx. Lungs: Clear to auscultation bilaterally. Heart: Regular rate and rhythm. No rubs, murmurs, or gallops. Abdomen: Soft, nontender, nondistended. No organomegaly noted, normoactive bowel sounds. Musculoskeletal: No edema, cyanosis, or clubbing. Neuro: Alert, answering all questions appropriately. Cranial nerves grossly intact. Skin: No rashes or petechiae noted. Psych: Normal affect. Lymphatics: No cervical, calvicular, axillary or inguinal LAD.   LAB RESULTS:  Lab Results  Component Value Date   NA 136 08/28/2016   K 4.0 08/28/2016   CL 105 08/28/2016   CO2 23 08/28/2016   GLUCOSE 114 (H) 08/28/2016   BUN 21 (H) 08/28/2016   CREATININE 0.88 08/28/2016   CALCIUM 8.3 (L) 08/28/2016   PROT 6.2 02/16/2015   ALBUMIN 4.0 02/16/2015   AST 14 02/16/2015   ALT 13 02/16/2015   ALKPHOS 40 02/16/2015   BILITOT 0.7 02/16/2015   GFRNONAA >60 08/28/2016   GFRAA >60 08/28/2016    Lab Results  Component Value Date   WBC 6.1 08/28/2016   NEUTROABS 4.6 02/16/2015   HGB 11.3 (L) 08/28/2016   HCT 32.9 (L) 08/28/2016   MCV 99.4 08/28/2016   PLT 222 08/28/2016     STUDIES: No results found.  ASSESSMENT: Bence-Jones proteinuria  PLAN:    1. Bence-Jones proteinuria:  I discussed the assessment and treatment plan with the patient. The patient was provided an opportunity to ask  questions and all were answered. The patient agreed with the plan and demonstrated an understanding of the instructions.   The patient was advised to call back or seek an in-person evaluation if the symptoms worsen or if the condition fails to improve as anticipated.  I provided *** minutes of {Blank single:19197::"face-to-face video visit time","non face-to-face telephone visit time"} during this encounter, and > 50% was spent counseling as documented under my assessment & plan.  Lloyd Huger, MD 12/18/2018 9:10 AM  Cancer Staging No matching staging information was found for the patient.

## 2018-12-21 ENCOUNTER — Other Ambulatory Visit: Payer: Self-pay

## 2018-12-21 ENCOUNTER — Inpatient Hospital Stay: Payer: Medicare Other | Admitting: Oncology

## 2018-12-21 ENCOUNTER — Inpatient Hospital Stay: Payer: Medicare Other | Attending: Oncology

## 2018-12-21 ENCOUNTER — Other Ambulatory Visit: Payer: Self-pay | Admitting: Oncology

## 2018-12-21 DIAGNOSIS — Z87891 Personal history of nicotine dependence: Secondary | ICD-10-CM | POA: Diagnosis not present

## 2018-12-21 DIAGNOSIS — R803 Bence Jones proteinuria: Secondary | ICD-10-CM

## 2018-12-21 DIAGNOSIS — F419 Anxiety disorder, unspecified: Secondary | ICD-10-CM | POA: Diagnosis not present

## 2018-12-21 DIAGNOSIS — M1990 Unspecified osteoarthritis, unspecified site: Secondary | ICD-10-CM | POA: Diagnosis not present

## 2018-12-21 DIAGNOSIS — Z7982 Long term (current) use of aspirin: Secondary | ICD-10-CM | POA: Insufficient documentation

## 2018-12-21 DIAGNOSIS — E785 Hyperlipidemia, unspecified: Secondary | ICD-10-CM | POA: Diagnosis not present

## 2018-12-21 DIAGNOSIS — R809 Proteinuria, unspecified: Secondary | ICD-10-CM | POA: Diagnosis present

## 2018-12-21 DIAGNOSIS — Z79899 Other long term (current) drug therapy: Secondary | ICD-10-CM | POA: Diagnosis not present

## 2018-12-21 DIAGNOSIS — I129 Hypertensive chronic kidney disease with stage 1 through stage 4 chronic kidney disease, or unspecified chronic kidney disease: Secondary | ICD-10-CM | POA: Diagnosis not present

## 2018-12-21 DIAGNOSIS — F329 Major depressive disorder, single episode, unspecified: Secondary | ICD-10-CM | POA: Insufficient documentation

## 2018-12-21 DIAGNOSIS — N189 Chronic kidney disease, unspecified: Secondary | ICD-10-CM | POA: Insufficient documentation

## 2018-12-21 DIAGNOSIS — K219 Gastro-esophageal reflux disease without esophagitis: Secondary | ICD-10-CM | POA: Insufficient documentation

## 2018-12-21 LAB — BASIC METABOLIC PANEL
Anion gap: 8 (ref 5–15)
BUN: 35 mg/dL — ABNORMAL HIGH (ref 8–23)
CO2: 27 mmol/L (ref 22–32)
Calcium: 9 mg/dL (ref 8.9–10.3)
Chloride: 106 mmol/L (ref 98–111)
Creatinine, Ser: 1.29 mg/dL — ABNORMAL HIGH (ref 0.44–1.00)
GFR calc Af Amer: 45 mL/min — ABNORMAL LOW (ref >60)
GFR calc non Af Amer: 39 mL/min — ABNORMAL LOW (ref >60)
Glucose, Bld: 102 mg/dL — ABNORMAL HIGH (ref 70–99)
Potassium: 3.9 mmol/L (ref 3.5–5.1)
Sodium: 141 mmol/L (ref 135–145)

## 2018-12-21 LAB — CBC WITH DIFFERENTIAL/PLATELET
Abs Immature Granulocytes: 0.14 10*3/uL — ABNORMAL HIGH (ref 0.00–0.07)
Basophils Absolute: 0.1 10*3/uL (ref 0.0–0.1)
Basophils Relative: 1 %
Eosinophils Absolute: 0.3 10*3/uL (ref 0.0–0.5)
Eosinophils Relative: 3 %
HCT: 38.4 % (ref 36.0–46.0)
Hemoglobin: 12.4 g/dL (ref 12.0–15.0)
Immature Granulocytes: 1 %
Lymphocytes Relative: 19 %
Lymphs Abs: 2.2 10*3/uL (ref 0.7–4.0)
MCH: 32.5 pg (ref 26.0–34.0)
MCHC: 32.3 g/dL (ref 30.0–36.0)
MCV: 100.5 fL — ABNORMAL HIGH (ref 80.0–100.0)
Monocytes Absolute: 0.7 10*3/uL (ref 0.1–1.0)
Monocytes Relative: 7 %
Neutro Abs: 8 10*3/uL — ABNORMAL HIGH (ref 1.7–7.7)
Neutrophils Relative %: 69 %
Platelets: 308 10*3/uL (ref 150–400)
RBC: 3.82 MIL/uL — ABNORMAL LOW (ref 3.87–5.11)
RDW: 15.2 % (ref 11.5–15.5)
WBC: 11.5 10*3/uL — ABNORMAL HIGH (ref 4.0–10.5)
nRBC: 0 % (ref 0.0–0.2)

## 2018-12-22 LAB — PROTEIN ELECTROPHORESIS, SERUM
A/G Ratio: 1.7 (ref 0.7–1.7)
Albumin ELP: 3.8 g/dL (ref 2.9–4.4)
Alpha-1-Globulin: 0.2 g/dL (ref 0.0–0.4)
Alpha-2-Globulin: 0.6 g/dL (ref 0.4–1.0)
Beta Globulin: 0.9 g/dL (ref 0.7–1.3)
Gamma Globulin: 0.5 g/dL (ref 0.4–1.8)
Globulin, Total: 2.3 g/dL (ref 2.2–3.9)
Total Protein ELP: 6.1 g/dL (ref 6.0–8.5)

## 2018-12-22 LAB — PROTEIN ELECTRO, RANDOM URINE
Albumin ELP, Urine: 9.1 %
Alpha-1-Globulin, U: 0.7 %
Alpha-2-Globulin, U: 5.5 %
Beta Globulin, U: 11 %
Gamma Globulin, U: 73.7 %
M Component, Ur: 61.7 % — ABNORMAL HIGH
Total Protein, Urine: 83.9 mg/dL

## 2018-12-22 LAB — KAPPA/LAMBDA LIGHT CHAINS
Kappa free light chain: 660.2 mg/L — ABNORMAL HIGH (ref 3.3–19.4)
Kappa, lambda light chain ratio: 66.02 — ABNORMAL HIGH (ref 0.26–1.65)
Lambda free light chains: 10 mg/L (ref 5.7–26.3)

## 2018-12-22 LAB — IGG, IGA, IGM
IgA: 129 mg/dL (ref 64–422)
IgG (Immunoglobin G), Serum: 668 mg/dL (ref 586–1602)
IgM (Immunoglobulin M), Srm: 9 mg/dL — ABNORMAL LOW (ref 26–217)

## 2018-12-23 ENCOUNTER — Other Ambulatory Visit: Payer: Self-pay | Admitting: *Deleted

## 2018-12-23 DIAGNOSIS — R803 Bence Jones proteinuria: Secondary | ICD-10-CM

## 2018-12-23 DIAGNOSIS — R809 Proteinuria, unspecified: Secondary | ICD-10-CM | POA: Diagnosis not present

## 2018-12-24 LAB — UPEP/TP, 24-HR URINE
Albumin, U: 10.7 %
Alpha 1, Urine: 2.2 %
Alpha 2, Urine: 4.4 %
Beta, Urine: 78.5 %
Gamma Globulin, Urine: 4.2 %
M-Spike, mg/24 hr: 182.8 mg/24 hr — ABNORMAL HIGH
M-spike, %: 65.6 % — ABNORMAL HIGH
Total Protein, Urine-Ur/day: 279 mg/24 hr — ABNORMAL HIGH (ref 30–150)
Total Protein, Urine: 19.9 mg/dL
Total Volume: 1400

## 2018-12-25 NOTE — Progress Notes (Signed)
[facility]  [phone] [phone]  HEMATOLOGY-ONCOLOGY TELEMEDICINE VISIT PROGRESS NOTE  ID: Beatriz Chancellor Miramontes OB: 1936/02/14  MR#: [id number]  NUU#:[id number]  Patient Care Team: Idelle Crouch, MD as PCP - General (Internal Medicine)  I connected with@ on 01/01/19 at 10:00 AM EDT by video enabled telemedicine visit and verified that I am speaking with the correct person using two identifiers.   I discussed the limitations, risks, security and privacy concerns of performing an evaluation and management service by telemedicine and the availability of in-person appointments. I also discussed with the patient that there may be a patient responsible charge related to this service. The patient expressed understanding and agreed to proceed.  Other persons participating in the visit and their role in the encounter: Patient, MD  PATIENT'S LOCATION: Home PROVIDER'S LOCATION: Clinic  CHIEF COMPLAINT: Bence-Jones proteinuria.  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  She currently feels well and is asymptomatic.  She has no neurologic complaints.  She denies any recent fevers or illnesses.  She has a good appetite and denies weight loss.  She has no chest pain, shortness of breath, cough, or hemoptysis.  She has no nausea, vomiting, constipation, or diarrhea.  She has no urinary complaints.  Patient feels at her baseline offers no specific complaints today.  REVIEW OF SYSTEMS:   Review of Systems  Constitutional: Negative.  Negative for fever, malaise/fatigue and weight loss.  Respiratory: Negative.  Negative for cough, hemoptysis and shortness of breath.   Cardiovascular: Negative.  Negative for chest pain and leg swelling.  Gastrointestinal: Negative.  Negative for abdominal pain.  Genitourinary: Negative.  Negative for dysuria and  hematuria.  Musculoskeletal: Negative.  Negative for back pain.  Skin: Negative.  Negative for rash.  Neurological: Negative.  Negative for dizziness, focal weakness, weakness and headaches.  Psychiatric/Behavioral: Negative.  The patient is not nervous/anxious.     As per HPI. Otherwise, a complete review of systems is negative.  PAST MEDICAL HISTORY: Past Medical History:  Diagnosis Date  . Allergy   . Anxiety   . Arthritis   . Chicken pox   . Depression   . Family history of polyps in the colon   . GERD (gastroesophageal reflux disease)   . Headache   . Heart murmur   . Heart palpitations   . Hyperlipidemia   . Hypertension   . Kidney stone   . Shingles 2012  . Stress fracture of lumbar vertebra     PAST SURGICAL HISTORY: Past Surgical History:  Procedure Laterality Date  . bilateral blephoplasty  2001  . BREAST CYST ASPIRATION  2001  . BREAST SURGERY Left    benign cyst  . CATARACT EXTRACTION, BILATERAL  2007  . EYE SURGERY    . KYPHOPLASTY N/A 09/11/2015   Procedure: KYPHOPLASTY T12;  Surgeon: Hessie Knows, MD;  Location: [facility] ORS;  Service: Orthopedics;  Laterality: N/A;  . RHINOPLASTY      FAMILY HISTORY: Family History  Problem Relation Age of Onset  . Arthritis Mother   . Hypertension Mother   . Osteoporosis Mother   . COPD Father   . Arthritis Maternal Grandmother   . Heart disease Maternal Grandfather   . Colon cancer Neg Hx   . Breast cancer Neg Hx     ADVANCED DIRECTIVES (Y/N):  N  HEALTH MAINTENANCE: Social History   Tobacco Use  .  Smoking status: Former Smoker    Packs/day: 1.00    Years: 20.00    Pack years: 20.00    Types: Cigarettes    Last attempt to quit: 08/11/1973    Years since quitting: 45.4  . Smokeless tobacco: Never Used  Substance Use Topics  . Alcohol use: Yes    Alcohol/week: 2.0 standard drinks    Types: 2 Glasses of wine per week  . Drug use: No     Colonoscopy:  PAP:  Bone density:  Lipid panel:  Allergies   Allergen Reactions  . Alendronate     Other reaction(s): Other (See Comments) Chest pain  . Amlodipine Besylate Other (See Comments)    "dizzy"  . Ciprofloxacin Hives  . Codeine Itching  . Lovastatin     Other reaction(s): Muscle Pain  . Septra [Sulfamethoxazole-Trimethoprim] Itching    Current Outpatient Medications  Medication Sig Dispense Refill  . acetaminophen (TYLENOL) 325 MG tablet Take 650 mg by mouth every 6 (six) hours as needed.    Marland Kitchen amitriptyline (ELAVIL) 25 MG tablet Take 1 tablet by mouth 1 day or 1 dose.    Marland Kitchen aspirin 325 MG tablet Take 325 mg by mouth as needed.     . Cholecalciferol (VITAMIN D-3) 1000 UNITS CAPS Take 1 capsule by mouth daily.    . cyanocobalamin 100 MCG tablet Take 1 tablet by mouth 1 day or 1 dose.    . diphenoxylate-atropine (LOMOTIL) 2.5-0.025 MG per tablet Take 2 tablets by mouth as needed for diarrhea or loose stools. Reported on 09/11/2015    . loratadine (CLARITIN) 10 MG tablet Take 10 mg by mouth daily as needed. Reported on 09/11/2015    . metoprolol succinate (TOPROL-XL) 25 MG 24 hr tablet Take 25 mg by mouth daily.    . Multiple Vitamins-Minerals (MULTIVITAMIN ADULT PO) Take 1 tablet by mouth 1 day or 1 dose.    . Probiotic Product (ALIGN) 4 MG CAPS Take 1 capsule by mouth 1 day or 1 dose.    Marland Kitchen tiZANidine (ZANAFLEX) 4 MG tablet Take 1 tablet by mouth 1 day or 1 dose.     No current facility-administered medications for this visit.     OBJECTIVE: There were no vitals filed for this visit.   There is no height or weight on file to calculate BMI.    ECOG FS:0 - Asymptomatic  General: Well-developed, well-nourished, no acute distress. HEENT: Normocephalic. Neuro: Alert, answering all questions appropriately. Cranial nerves grossly intact. Skin: No rashes or petechiae noted. Psych: Normal affect.  LAB RESULTS:  Lab Results  Component Value Date   NA 141 12/21/2018   K 3.9 12/21/2018   CL 106 12/21/2018   CO2 27 12/21/2018   GLUCOSE  102 (H) 12/21/2018   BUN 35 (H) 12/21/2018   CREATININE 1.29 (H) 12/21/2018   CALCIUM 9.0 12/21/2018   PROT 6.2 02/16/2015   ALBUMIN 4.0 02/16/2015   AST 14 02/16/2015   ALT 13 02/16/2015   ALKPHOS 40 02/16/2015   BILITOT 0.7 02/16/2015   GFRNONAA 39 (L) 12/21/2018   GFRAA 45 (L) 12/21/2018    Lab Results  Component Value Date   WBC 11.5 (H) 12/21/2018   NEUTROABS 8.0 (H) 12/21/2018   HGB 12.4 12/21/2018   HCT 38.4 12/21/2018   MCV 100.5 (H) 12/21/2018   PLT 308 12/21/2018     STUDIES: No results found.  ASSESSMENT: Bence-Jones proteinuria  PLAN:    1. Bence-Jones proteinuria: Patient's 24-hour UPEP revealed only 0.182  g of monoclonal protein.  She does not have an M spike in her serum.  (For patient to be considered a smoldering myeloma she would have to have greater than 0.5 g of monoclonal protein in her urine over 24-hours or 10 to 60% plasma cells in her bone marrow without endorgan damage).  Kappa free light chains are significantly elevated at 662 consistent with a light chain MGUS.  She has a mild renal insufficiency at 1.29, but no other evidence of endorgan damage.  IgG, IgM, and IgA are not elevated.  No intervention is needed at this time.  Patient does not require bone marrow biopsy or metastatic bone survey.  Return to clinic in 6 months with repeat laboratory work and further evaluation. 2.  Chronic renal insufficiency: Patient's creatinine is 1.29.  Monitor.  I discussed the assessment and treatment plan with the patient. The patient was provided an opportunity to ask questions and all were answered. The patient agreed with the plan and demonstrated an understanding of the instructions.   The patient was advised to call back or seek an in-person evaluation if the symptoms worsen or if the condition fails to improve as anticipated.  I provided 45 minutes of face-to-face video visit time during this encounter, and > 50% was spent counseling as documented under my  assessment & plan.  Lloyd Huger, MD 01/01/2019 8:14 AM  Cancer Staging No matching staging information was found for the patient.

## 2018-12-28 ENCOUNTER — Telehealth: Payer: Self-pay | Admitting: Oncology

## 2018-12-29 ENCOUNTER — Encounter: Payer: Self-pay | Admitting: Oncology

## 2018-12-29 ENCOUNTER — Inpatient Hospital Stay (HOSPITAL_BASED_OUTPATIENT_CLINIC_OR_DEPARTMENT_OTHER): Payer: Medicare Other | Admitting: Oncology

## 2018-12-29 ENCOUNTER — Other Ambulatory Visit: Payer: Self-pay

## 2018-12-29 DIAGNOSIS — N189 Chronic kidney disease, unspecified: Secondary | ICD-10-CM

## 2018-12-29 DIAGNOSIS — D472 Monoclonal gammopathy: Secondary | ICD-10-CM

## 2018-12-29 DIAGNOSIS — R803 Bence Jones proteinuria: Secondary | ICD-10-CM | POA: Diagnosis not present

## 2018-12-29 NOTE — Progress Notes (Signed)
Patient stated that she wanted to speak to you about her concerns. Patient also wanted to mention that when she moves too fast, she gets light headedness. Patient stated that she has had stiffed bilateral hands. Patient also would like to mention that she has back aches when sitting for a while. Patient wanted to mention that she continues to have neck and bilateral shoulder pain that doesn't get any better. Patient stated that she doesn't sleep at night "too good".

## 2019-01-17 ENCOUNTER — Other Ambulatory Visit: Payer: Self-pay | Admitting: Internal Medicine

## 2019-01-17 DIAGNOSIS — Z1231 Encounter for screening mammogram for malignant neoplasm of breast: Secondary | ICD-10-CM

## 2019-03-01 ENCOUNTER — Ambulatory Visit
Admission: RE | Admit: 2019-03-01 | Discharge: 2019-03-01 | Disposition: A | Discharge status: Home / Self Care | Payer: Medicare Other | Source: Ambulatory Visit | Attending: Internal Medicine | Admitting: Internal Medicine

## 2019-03-01 DIAGNOSIS — Z1231 Encounter for screening mammogram for malignant neoplasm of breast: Secondary | ICD-10-CM

## 2019-05-24 DIAGNOSIS — N179 Acute kidney failure, unspecified: Secondary | ICD-10-CM | POA: Insufficient documentation

## 2019-05-24 DIAGNOSIS — D472 Monoclonal gammopathy: Secondary | ICD-10-CM | POA: Insufficient documentation

## 2019-05-24 DIAGNOSIS — I129 Hypertensive chronic kidney disease with stage 1 through stage 4 chronic kidney disease, or unspecified chronic kidney disease: Secondary | ICD-10-CM | POA: Insufficient documentation

## 2019-05-24 DIAGNOSIS — N183 Chronic kidney disease, stage 3 unspecified: Secondary | ICD-10-CM | POA: Insufficient documentation

## 2019-05-27 ENCOUNTER — Other Ambulatory Visit: Payer: Self-pay | Admitting: Nephrology

## 2019-05-27 DIAGNOSIS — D472 Monoclonal gammopathy: Secondary | ICD-10-CM

## 2019-05-27 DIAGNOSIS — N179 Acute kidney failure, unspecified: Secondary | ICD-10-CM

## 2019-05-27 DIAGNOSIS — N183 Chronic kidney disease, stage 3 unspecified: Secondary | ICD-10-CM

## 2019-06-03 ENCOUNTER — Other Ambulatory Visit: Payer: Self-pay | Admitting: Nephrology

## 2019-06-03 ENCOUNTER — Other Ambulatory Visit: Payer: Self-pay

## 2019-06-03 ENCOUNTER — Other Ambulatory Visit (HOSPITAL_COMMUNITY): Payer: Self-pay | Admitting: Nephrology

## 2019-06-03 ENCOUNTER — Ambulatory Visit
Admission: RE | Admit: 2019-06-03 | Discharge: 2019-06-03 | Disposition: A | Discharge status: Home / Self Care | Payer: Medicare Other | Source: Ambulatory Visit | Attending: Nephrology | Admitting: Nephrology

## 2019-06-03 DIAGNOSIS — N179 Acute kidney failure, unspecified: Secondary | ICD-10-CM | POA: Insufficient documentation

## 2019-06-03 DIAGNOSIS — D472 Monoclonal gammopathy: Secondary | ICD-10-CM | POA: Diagnosis present

## 2019-06-03 DIAGNOSIS — N183 Chronic kidney disease, stage 3 unspecified: Secondary | ICD-10-CM | POA: Insufficient documentation

## 2019-06-16 ENCOUNTER — Other Ambulatory Visit: Payer: Self-pay

## 2019-06-16 NOTE — Progress Notes (Signed)
Patient pre screened for office appointment, no questions or concerns today. 

## 2019-06-17 ENCOUNTER — Encounter: Payer: Self-pay | Admitting: Oncology

## 2019-06-17 ENCOUNTER — Other Ambulatory Visit: Payer: Self-pay

## 2019-06-17 ENCOUNTER — Inpatient Hospital Stay: Payer: Medicare Other | Attending: Oncology | Admitting: Oncology

## 2019-06-17 VITALS — BP 164/91 | HR 70 | Temp 98.3°F | Resp 18 | Wt 151.3 lb

## 2019-06-17 DIAGNOSIS — R809 Proteinuria, unspecified: Secondary | ICD-10-CM | POA: Insufficient documentation

## 2019-06-17 DIAGNOSIS — R803 Bence Jones proteinuria: Secondary | ICD-10-CM | POA: Diagnosis not present

## 2019-06-17 DIAGNOSIS — N183 Chronic kidney disease, stage 3 unspecified: Secondary | ICD-10-CM | POA: Insufficient documentation

## 2019-06-17 NOTE — Progress Notes (Signed)
Patient is here for follow up she is doing well her bp taken first 191/94 rechecked after 10 minutes and it went down to 164/91 she denies headache,double vision, blurred vision,  but mentions neck pain.

## 2019-06-17 NOTE — Progress Notes (Signed)
[facility]  [phone] [phone]  HEMATOLOGY-ONCOLOGY TELEMEDICINE VISIT PROGRESS NOTE  ID: Marilyn Sharp OB: 07-12-1936  MR#: [id number]  CWU#:[id number]  Patient Care Team: Idelle Crouch, MD as PCP - General (Internal Medicine)   CHIEF COMPLAINT: Bence-Jones proteinuria.  INTERVAL HISTORY: Patient returns to clinic today at the request of nephrology for consideration of bone marrow biopsy.  Her kidney function has slightly decreased and thought is to pursue a bone marrow biopsy rather than kidney biopsy.  Patient currently feels well and is asymptomatic. She has no neurologic complaints.  She denies any recent fevers or illnesses.  She has a good appetite and denies weight loss.  She has no chest pain, shortness of breath, cough, or hemoptysis.  She has no nausea, vomiting, constipation, or diarrhea.  She has no urinary complaints.  Patient offers no specific complaints today.  REVIEW OF SYSTEMS:   Review of Systems  Constitutional: Negative.  Negative for fever, malaise/fatigue and weight loss.  Respiratory: Negative.  Negative for cough, hemoptysis and shortness of breath.   Cardiovascular: Negative.  Negative for chest pain and leg swelling.  Gastrointestinal: Negative.  Negative for abdominal pain.  Genitourinary: Negative.  Negative for dysuria and hematuria.  Musculoskeletal: Negative.  Negative for back pain.  Skin: Negative.  Negative for rash.  Neurological: Negative.  Negative for dizziness, focal weakness, weakness and headaches.  Psychiatric/Behavioral: Negative.  The patient is not nervous/anxious.     As per HPI. Otherwise, a complete review of systems is negative.  PAST MEDICAL HISTORY: Past Medical History:  Diagnosis Date  . Allergy   . Anxiety   . Arthritis   . Chicken pox   . Depression   . Family history of polyps in the colon   . GERD (gastroesophageal reflux disease)   . Headache   . Heart murmur   .  Heart palpitations   . Hyperlipidemia   . Hypertension   . Kidney stone   . Shingles 2012  . Stress fracture of lumbar vertebra     PAST SURGICAL HISTORY: Past Surgical History:  Procedure Laterality Date  . bilateral blephoplasty  2001  . BREAST CYST ASPIRATION  2001  . BREAST SURGERY Left    benign cyst  . CATARACT EXTRACTION, BILATERAL  2007  . EYE SURGERY    . KYPHOPLASTY N/A 09/11/2015   Procedure: KYPHOPLASTY T12;  Surgeon: Hessie Knows, MD;  Location: [facility] ORS;  Service: Orthopedics;  Laterality: N/A;  . RHINOPLASTY      FAMILY HISTORY: Family History  Problem Relation Age of Onset  . Arthritis Mother   . Hypertension Mother   . Osteoporosis Mother   . COPD Father   . Arthritis Maternal Grandmother   . Heart disease Maternal Grandfather   . Colon cancer Neg Hx   . Breast cancer Neg Hx     ADVANCED DIRECTIVES (Y/N):  N  HEALTH MAINTENANCE: Social History   Tobacco Use  . Smoking status: Former Smoker    Packs/day: 1.00    Years: 20.00    Pack years: 20.00    Types: Cigarettes    Quit date: 08/11/1973    Years since quitting: 45.8  . Smokeless tobacco: Never Used  Substance Use Topics  . Alcohol use: Yes    Alcohol/week: 2.0 standard drinks    Types: 2 Glasses of wine per week  . Drug use: No     Colonoscopy:  PAP:  Bone density:  Lipid panel:  Allergies  Allergen Reactions  . Alendronate     Other reaction(s): Other (See Comments) Chest pain  . Amlodipine Besylate Other (See Comments)    "dizzy"  . Ciprofloxacin Hives  . Codeine Itching  . Lovastatin     Other reaction(s): Muscle Pain  . Septra [Sulfamethoxazole-Trimethoprim] Itching    Current Outpatient Medications  Medication Sig Dispense Refill  . acetaminophen (TYLENOL) 325 MG tablet Take 650 mg by mouth every 6 (six) hours as needed.    Marland Kitchen amitriptyline (ELAVIL) 25 MG tablet Take 1 tablet by mouth 1 day or 1 dose.    Marland Kitchen aspirin 325 MG tablet Take 325 mg by mouth as needed.     .  Cholecalciferol (VITAMIN D-3) 1000 UNITS CAPS Take 1 capsule by mouth daily.    . cyanocobalamin 100 MCG tablet Take 1 tablet by mouth 1 day or 1 dose.    . diphenoxylate-atropine (LOMOTIL) 2.5-0.025 MG per tablet Take 2 tablets by mouth as needed for diarrhea or loose stools. Reported on 09/11/2015    . loratadine (CLARITIN) 10 MG tablet Take 10 mg by mouth daily as needed. Reported on 09/11/2015    . metoprolol succinate (TOPROL-XL) 25 MG 24 hr tablet Take 25 mg by mouth daily.    . Multiple Vitamins-Minerals (MULTIVITAMIN ADULT PO) Take 1 tablet by mouth 1 day or 1 dose.    . Probiotic Product (ALIGN) 4 MG CAPS Take 1 capsule by mouth 1 day or 1 dose.    Marland Kitchen tiZANidine (ZANAFLEX) 4 MG tablet Take 1 tablet by mouth 1 day or 1 dose.     No current facility-administered medications for this visit.     OBJECTIVE: Vitals:   06/17/19 1014 06/17/19 1021  BP: (!) 191/94 (!) 164/91  Pulse: 70   Resp: 18   Temp: 98.3 F (36.8 C)   SpO2: 98%      Body mass index is 24.42 kg/m.    ECOG FS:0 - Asymptomatic  General: Well-developed, well-nourished, no acute distress. Eyes: Pink conjunctiva, anicteric sclera. HEENT: Normocephalic, moist mucous membranes. Lungs: Clear to auscultation bilaterally. Heart: Regular rate and rhythm. No rubs, murmurs, or gallops. Abdomen: Soft, nontender, nondistended. No organomegaly noted, normoactive bowel sounds. Musculoskeletal: No edema, cyanosis, or clubbing. Neuro: Alert, answering all questions appropriately. Cranial nerves grossly intact. Skin: No rashes or petechiae noted. Psych: Normal affect.  LAB RESULTS:  Lab Results  Component Value Date   NA 141 12/21/2018   K 3.9 12/21/2018   CL 106 12/21/2018   CO2 27 12/21/2018   GLUCOSE 102 (H) 12/21/2018   BUN 35 (H) 12/21/2018   CREATININE 1.29 (H) 12/21/2018   CALCIUM 9.0 12/21/2018   PROT 6.2 02/16/2015   ALBUMIN 4.0 02/16/2015   AST 14 02/16/2015   ALT 13 02/16/2015   ALKPHOS 40 02/16/2015    BILITOT 0.7 02/16/2015   GFRNONAA 39 (L) 12/21/2018   GFRAA 45 (L) 12/21/2018    Lab Results  Component Value Date   WBC 11.5 (H) 12/21/2018   NEUTROABS 8.0 (H) 12/21/2018   HGB 12.4 12/21/2018   HCT 38.4 12/21/2018   MCV 100.5 (H) 12/21/2018   PLT 308 12/21/2018     STUDIES: US Renal  Result Date: 06/03/2019 CLINICAL DATA:  83 year old female with acute renal injury on stage III chronic renal disease. EXAM: RENAL / URINARY TRACT ULTRASOUND COMPLETE COMPARISON:  None. FINDINGS: Right Kidney: Renal measurements: 9.3 x 3.1 x 5 cm = volume: 85 mL. A 3.1 cm cyst is present. Echogenicity within  normal limits. No solid mass or hydronephrosis visualized. Left Kidney: Renal measurements: 9.8 x 4.2 x 4 cm = volume: 86 mL. A 1.5 cm cyst is present. Echogenicity within normal limits. No solid mass or hydronephrosis visualized. Bladder: Appears normal for degree of bladder distention. Other: None IMPRESSION: 1. Small kidneys. Echogenicity within normal limits and no evidence of hydronephrosis. Electronically Signed   By: Margarette Canada M.D.   On: 06/03/2019 14:00    ASSESSMENT: Bence-Jones proteinuria  PLAN:    1. Bence-Jones proteinuria: Patient's 24-hour UPEP revealed only 0.182 g of monoclonal protein.  She does not have an M spike in her serum.  (For patient to be considered a smoldering myeloma she would have to have greater than 0.5 g of monoclonal protein in her urine over 24-hours or 10 to 60% plasma cells in her bone marrow without endorgan damage).  Kappa free light chains are significantly elevated at 662 consistent with a light chain MGUS.  She has a mild renal insufficiency at 1.29, but no other evidence of endorgan damage.  IgG, IgM, and IgA are not elevated.   2.  Chronic renal insufficiency: Slightly worse.  Patient has agreed to bone marrow biopsy in 2 weeks for further evaluation and assessment of possible myeloma causing worsening of kidney function.  Return to clinic 1 week after her  bone marrow biopsy to discuss the results.  Continue follow-up with nephrology as scheduled.   I spent a total of 30 minutes face-to-face with the patient of which greater than 50% of the visit was spent in counseling and coordination of care as detailed above.   Lloyd Huger, MD 06/17/2019 12:26 PM

## 2019-06-23 NOTE — Progress Notes (Signed)
Patient on schedule for BMB on 07/04/2019, pre procedure instructions given with questions answered. Aware to be NPO after midnight except meds with sip of water. Aware also to be here at 0800. States understanding.

## 2019-07-04 ENCOUNTER — Other Ambulatory Visit: Payer: Self-pay | Admitting: Oncology

## 2019-07-04 ENCOUNTER — Other Ambulatory Visit: Payer: Medicare Other

## 2019-07-04 ENCOUNTER — Other Ambulatory Visit: Payer: Self-pay

## 2019-07-04 ENCOUNTER — Other Ambulatory Visit: Payer: Self-pay | Admitting: Student

## 2019-07-04 ENCOUNTER — Ambulatory Visit
Admission: RE | Admit: 2019-07-04 | Discharge: 2019-07-04 | Disposition: A | Discharge status: Home / Self Care | Payer: Medicare Other | Source: Ambulatory Visit | Attending: Oncology | Admitting: Oncology

## 2019-07-04 DIAGNOSIS — Z8249 Family history of ischemic heart disease and other diseases of the circulatory system: Secondary | ICD-10-CM | POA: Diagnosis not present

## 2019-07-04 DIAGNOSIS — D72822 Plasmacytosis: Secondary | ICD-10-CM | POA: Diagnosis not present

## 2019-07-04 DIAGNOSIS — Z885 Allergy status to narcotic agent status: Secondary | ICD-10-CM | POA: Diagnosis not present

## 2019-07-04 DIAGNOSIS — Z881 Allergy status to other antibiotic agents status: Secondary | ICD-10-CM | POA: Diagnosis not present

## 2019-07-04 DIAGNOSIS — F419 Anxiety disorder, unspecified: Secondary | ICD-10-CM | POA: Diagnosis not present

## 2019-07-04 DIAGNOSIS — Z8261 Family history of arthritis: Secondary | ICD-10-CM | POA: Insufficient documentation

## 2019-07-04 DIAGNOSIS — M1990 Unspecified osteoarthritis, unspecified site: Secondary | ICD-10-CM | POA: Diagnosis not present

## 2019-07-04 DIAGNOSIS — Z888 Allergy status to other drugs, medicaments and biological substances status: Secondary | ICD-10-CM | POA: Diagnosis not present

## 2019-07-04 DIAGNOSIS — R803 Bence Jones proteinuria: Secondary | ICD-10-CM

## 2019-07-04 DIAGNOSIS — Z7982 Long term (current) use of aspirin: Secondary | ICD-10-CM | POA: Diagnosis not present

## 2019-07-04 DIAGNOSIS — Z87891 Personal history of nicotine dependence: Secondary | ICD-10-CM | POA: Diagnosis not present

## 2019-07-04 DIAGNOSIS — Z882 Allergy status to sulfonamides status: Secondary | ICD-10-CM | POA: Diagnosis not present

## 2019-07-04 DIAGNOSIS — F329 Major depressive disorder, single episode, unspecified: Secondary | ICD-10-CM | POA: Insufficient documentation

## 2019-07-04 DIAGNOSIS — I10 Essential (primary) hypertension: Secondary | ICD-10-CM | POA: Diagnosis not present

## 2019-07-04 DIAGNOSIS — Z79899 Other long term (current) drug therapy: Secondary | ICD-10-CM | POA: Diagnosis not present

## 2019-07-04 LAB — CBC WITH DIFFERENTIAL/PLATELET
Abs Immature Granulocytes: 0.33 10*3/uL — ABNORMAL HIGH (ref 0.00–0.07)
Basophils Absolute: 0.1 10*3/uL (ref 0.0–0.1)
Basophils Relative: 1 %
Eosinophils Absolute: 0.5 10*3/uL (ref 0.0–0.5)
Eosinophils Relative: 4 %
HCT: 36.3 % (ref 36.0–46.0)
Hemoglobin: 12.2 g/dL (ref 12.0–15.0)
Immature Granulocytes: 3 %
Lymphocytes Relative: 20 %
Lymphs Abs: 2.5 10*3/uL (ref 0.7–4.0)
MCH: 32.2 pg (ref 26.0–34.0)
MCHC: 33.6 g/dL (ref 30.0–36.0)
MCV: 95.8 fL (ref 80.0–100.0)
Monocytes Absolute: 0.9 10*3/uL (ref 0.1–1.0)
Monocytes Relative: 7 %
Neutro Abs: 8.1 10*3/uL — ABNORMAL HIGH (ref 1.7–7.7)
Neutrophils Relative %: 65 %
Platelets: 325 10*3/uL (ref 150–400)
RBC: 3.79 MIL/uL — ABNORMAL LOW (ref 3.87–5.11)
RDW: 14.7 % (ref 11.5–15.5)
WBC: 12.4 10*3/uL — ABNORMAL HIGH (ref 4.0–10.5)
nRBC: 0 % (ref 0.0–0.2)

## 2019-07-04 LAB — PROTIME-INR
INR: 1.1 (ref 0.8–1.2)
Prothrombin Time: 14.1 seconds (ref 11.4–15.2)

## 2019-07-04 MED ORDER — SODIUM CHLORIDE 0.9 % IV SOLN: INTRAVENOUS | Status: DC

## 2019-07-04 MED ORDER — FENTANYL CITRATE (PF) 100 MCG/2ML IJ SOLN
INTRAMUSCULAR | Status: AC
  Filled 2019-07-04: qty 2

## 2019-07-04 MED ORDER — HEPARIN SOD (PORK) LOCK FLUSH 100 UNIT/ML IV SOLN
INTRAVENOUS | Status: AC
  Filled 2019-07-04: qty 5

## 2019-07-04 MED ORDER — FENTANYL CITRATE (PF) 100 MCG/2ML IJ SOLN
INTRAMUSCULAR | Status: AC | PRN
  Administered 2019-07-04 (×2): 50 ug via INTRAVENOUS

## 2019-07-04 MED ORDER — MIDAZOLAM HCL 2 MG/2ML IJ SOLN
INTRAMUSCULAR | Status: AC | PRN
  Administered 2019-07-04 (×2): 1 mg via INTRAVENOUS

## 2019-07-04 MED ORDER — MIDAZOLAM HCL 2 MG/2ML IJ SOLN
INTRAMUSCULAR | Status: AC
  Filled 2019-07-04: qty 2

## 2019-07-04 NOTE — Consult Note (Signed)
Chief Complaint: Bence-Jones proteinuria  Referring Physician(s): Finnegan,Timothy J  Patient Status: [facility] - Out-pt  History of Present Illness: Marilyn Sharp Strength is a 83 y.o. female with past medical history significant for renal insufficiency, found to have Bence-Jones proteinuria and concern for potential underlying multiple myeloma.  Patient presents today for CT-guided bone marrow biopsy for tissue diagnostic purposes.  The patient is unaccompanied and serves as her own historian.  She remains without complaint specifically, no chest pain, shortness of breath, fever or chills.  No unintentional weight loss.  No change in appetite or energy level.  Past Medical History:  Diagnosis Date  . Allergy   . Anxiety   . Arthritis   . Chicken pox   . Depression   . Family history of polyps in the colon   . GERD (gastroesophageal reflux disease)   . Headache   . Heart murmur   . Heart palpitations   . Hyperlipidemia   . Hypertension   . Kidney stone   . Shingles 2012  . Stress fracture of lumbar vertebra     Past Surgical History:  Procedure Laterality Date  . bilateral blephoplasty  2001  . BREAST CYST ASPIRATION  2001  . BREAST SURGERY Left    benign cyst  . CATARACT EXTRACTION, BILATERAL  2007  . EYE SURGERY    . KYPHOPLASTY N/A 09/11/2015   Procedure: KYPHOPLASTY T12;  Surgeon: Hessie Knows, MD;  Location: [facility] ORS;  Service: Orthopedics;  Laterality: N/A;  . RHINOPLASTY      Allergies: Alendronate, Amlodipine besylate, Ciprofloxacin, Codeine, Lovastatin, and Septra [sulfamethoxazole-trimethoprim]  Medications: Prior to Admission medications   Medication Sig Start Date End Date Taking? Authorizing Provider  aspirin 325 MG tablet Take 325 mg by mouth as needed.    Yes [provider]  benazepril (LOTENSIN) 10 MG tablet Take 10 mg by mouth daily.   Yes [provider]  Cholecalciferol (VITAMIN D-3) 1000 UNITS CAPS Take 1 capsule by mouth daily.    Yes [provider]  cyanocobalamin 100 MCG tablet Take 1 tablet by mouth 1 day or 1 dose.   Yes [provider]  diltiazem (CARDIZEM) 30 MG tablet Take 30 mg by mouth once.   Yes [provider]  Multiple Vitamins-Minerals (MULTIVITAMIN ADULT PO) Take 1 tablet by mouth 1 day or 1 dose.   Yes [provider]  acetaminophen (TYLENOL) 325 MG tablet Take 650 mg by mouth every 6 (six) hours as needed.    [provider]  amitriptyline (ELAVIL) 25 MG tablet Take 1 tablet by mouth 1 day or 1 dose. 08/18/18   [provider]  diphenoxylate-atropine (LOMOTIL) 2.5-0.025 MG per tablet Take 2 tablets by mouth as needed for diarrhea or loose stools. Reported on 09/11/2015    [provider]  loratadine (CLARITIN) 10 MG tablet Take 10 mg by mouth daily as needed. Reported on 09/11/2015    [provider]  metoprolol succinate (TOPROL-XL) 25 MG 24 hr tablet Take 25 mg by mouth daily.    [provider]  Probiotic Product (ALIGN) 4 MG CAPS Take 1 capsule by mouth 1 day or 1 dose.    [provider]  tiZANidine (ZANAFLEX) 4 MG tablet Take 1 tablet by mouth 1 day or 1 dose. 07/03/18   [provider]     Family History  Problem Relation Age of Onset  . Arthritis Mother   . Hypertension Mother   . Osteoporosis Mother   .  COPD Father   . Arthritis Maternal Grandmother   . Heart disease Maternal Grandfather   . Colon cancer Neg Hx   . Breast cancer Neg Hx     Social History   Socioeconomic History  . Marital status: Widowed    Spouse name: Not on file  . Number of children: Not on file  . Years of education: Not on file  . Highest education level: Not on file  Occupational History  . Not on file  Social Needs  . Financial resource strain: Not on file  . Food insecurity    Worry: Not on file    Inability: Not on file  . Transportation needs    Medical: Not on file    Non-medical: Not on file  Tobacco  Use  . Smoking status: Former Smoker    Packs/day: 1.00    Years: 20.00    Pack years: 20.00    Types: Cigarettes    Quit date: 08/11/1973    Years since quitting: 45.9  . Smokeless tobacco: Never Used  Substance and Sexual Activity  . Alcohol use: Yes    Alcohol/week: 2.0 standard drinks    Types: 2 Glasses of wine per week  . Drug use: No  . Sexual activity: Never  Lifestyle  . Physical activity    Days per week: Not on file    Minutes per session: Not on file  . Stress: Not on file  Relationships  . Social Herbalist on phone: Not on file    Gets together: Not on file    Attends religious service: Not on file    Active member of club or organization: Not on file    Attends meetings of clubs or organizations: Not on file    Relationship status: Not on file  Other Topics Concern  . Not on file  Social History Narrative   Has son with Down's syndrome   Widowed.   Minimal exercise   Diet: healthy       ECOG Status: 0 - Asymptomatic  Review of Systems: A 12 point ROS discussed and pertinent positives are indicated in the HPI above.  All other systems are negative.  Review of Systems  Vital Signs: BP (!) 162/86   Pulse 72   Temp 97.7 F (36.5 C) (Oral)   Resp 12   Ht _0  (1.676 m)   Wt 68 kg   SpO2 94%   BMI 24.21 kg/m   Physical Exam  Imaging: No results found.  Labs:  CBC: Recent Labs    12/21/18 1018 07/04/19 0934  WBC 11.5* 12.4*  HGB 12.4 12.2  HCT 38.4 36.3  PLT 308 325    COAGS: Recent Labs    07/04/19 0934  INR 1.1    BMP: Recent Labs    12/21/18 1018  NA 141  K 3.9  CL 106  CO2 27  GLUCOSE 102*  BUN 35*  CALCIUM 9.0  CREATININE 1.29*  GFRNONAA 39*  GFRAA 45*    LIVER FUNCTION TESTS: No results for input(s): BILITOT, AST, ALT, ALKPHOS, PROT, ALBUMIN in the last 8760 hours.  TUMOR MARKERS: No results for input(s): AFPTM, CEA, CA199, CHROMGRNA in the last 8760 hours.  Assessment and Plan:  Marilyn Urizar  Sharp is a 83 y.o. female with past medical history significant for renal insufficiency, found to have Bence-Jones proteinuria and concern for potential underlying multiple myeloma.  Patient presents today for CT-guided bone marrow biopsy for tissue  diagnostic purposes.  Risks and benefits of bone marrow biopsy was discussed with the patient and/or patient's family including, but not limited to bleeding, infection, damage to adjacent structures or low yield requiring additional tests.  All of the questions were answered and there is agreement to proceed.  Consent signed and in chart.  Thank you for this interesting consult.  I greatly enjoyed meeting Allyah Heather Radel and look forward to participating in their care.  A copy of this report was sent to the requesting provider on this date.  Electronically Signed: Sandi Mariscal, MD 07/04/2019, 10:17 AM   I spent a total of 15 Minutes in face to face in clinical consultation, greater than 50% of which was counseling/coordinating care for CT-guided bone marrow biopsy and aspiration

## 2019-07-04 NOTE — Procedures (Signed)
Pre-procedure Diagnosis: Bence Jones Proteinuria, concern for multiple myeloma.  Post-procedure Diagnosis: Same  Technically successful CT guided bone marrow aspiration and biopsy of left iliac crest.   Complications: None Immediate  EBL: None  Signed: John Watts Pager: [phone] 07/04/2019, 11:39 AM    

## 2019-07-06 ENCOUNTER — Other Ambulatory Visit: Payer: Self-pay

## 2019-07-06 NOTE — Progress Notes (Signed)
[place]  [phone] [phone]  HEMATOLOGY-ONCOLOGY TELEMEDICINE VISIT PROGRESS NOTE  ID: Marilyn Sharp Yanni OB: 03-Jul-1936  MR#: [id number]  OIZ#:[id number]  Patient Care Team: Idelle Crouch, MD as PCP - General (Internal Medicine)   I connected with Marilyn Sharp [place] on 07/13/19 at 11:00 AM EST by video enabled telemedicine visit and verified that I am speaking with the correct person using two identifiers.   I discussed the limitations, risks, security and privacy concerns of performing an evaluation and management service by telemedicine and the availability of in-person appointments. I also discussed with the patient that there may be a patient responsible charge related to this service. The patient expressed understanding and agreed to proceed.   Other persons participating in the visit and their role in the encounter: Patient, MD  Patient's location: Home Provider's location: Clinic  CHIEF COMPLAINT: MGUS, Bence-Jones proteinuria.  INTERVAL HISTORY: Patient agreed to video enabled telemedicine visit for further evaluation and discussion of her bone marrow biopsy results.  She continues to feel well and remains asymptomatic. She has no neurologic complaints.  She denies any recent fevers or illnesses.  She has a good appetite and denies weight loss.  She has no chest pain, shortness of breath, cough, or hemoptysis.  She has no nausea, vomiting, constipation, or diarrhea.  She has no urinary complaints.  Patient feels at her baseline and offers no specific complaints today.  REVIEW OF SYSTEMS:   Review of Systems  Constitutional: Negative.  Negative for fever, malaise/fatigue and weight loss.  Respiratory: Negative.  Negative for cough, hemoptysis and shortness of breath.   Cardiovascular: Negative.  Negative for chest pain and leg swelling.  Gastrointestinal: Negative.  Negative for abdominal pain.  Genitourinary: Negative.  Negative for  dysuria and hematuria.  Musculoskeletal: Negative.  Negative for back pain.  Skin: Negative.  Negative for rash.  Neurological: Negative.  Negative for dizziness, focal weakness, weakness and headaches.  Psychiatric/Behavioral: Negative.  The patient is not nervous/anxious.     As per HPI. Otherwise, a complete review of systems is negative.  PAST MEDICAL HISTORY: Past Medical History:  Diagnosis Date  . Allergy   . Anxiety   . Arthritis   . Chicken pox   . Depression   . Family history of polyps in the colon   . GERD (gastroesophageal reflux disease)   . Headache   . Heart murmur   . Heart palpitations   . Hyperlipidemia   . Hypertension   . Kidney stone   . Shingles 2012  . Stress fracture of lumbar vertebra     PAST SURGICAL HISTORY: Past Surgical History:  Procedure Laterality Date  . bilateral blephoplasty  2001  . BREAST CYST ASPIRATION  2001  . BREAST SURGERY Left    benign cyst  . CATARACT EXTRACTION, BILATERAL  2007  . EYE SURGERY    . KYPHOPLASTY N/A 09/11/2015   Procedure: KYPHOPLASTY T12;  Surgeon: Hessie Knows, MD;  Location: [facility] ORS;  Service: Orthopedics;  Laterality: N/A;  . RHINOPLASTY      FAMILY HISTORY: Family History  Problem Relation Age of Onset  . Arthritis Mother   . Hypertension Mother   . Osteoporosis Mother   . COPD Father   . Arthritis Maternal Grandmother   . Heart disease Maternal Grandfather   . Colon cancer Neg Hx   . Breast cancer Neg Hx     ADVANCED DIRECTIVES (Y/N):  N  HEALTH MAINTENANCE: Social History  Tobacco Use  . Smoking status: Former Smoker    Packs/day: 1.00    Years: 20.00    Pack years: 20.00    Types: Cigarettes    Quit date: 08/11/1973    Years since quitting: 45.9  . Smokeless tobacco: Never Used  Substance Use Topics  . Alcohol use: Yes    Alcohol/week: 2.0 standard drinks    Types: 2 Glasses of wine per week  . Drug use: No     Colonoscopy:  PAP:  Bone density:  Lipid panel:   Allergies  Allergen Reactions  . Alendronate     Other reaction(s): Other (See Comments) Chest pain  . Amlodipine Besylate Other (See Comments)    "dizzy"  . Ciprofloxacin Hives  . Codeine Itching  . Lovastatin     Other reaction(s): Muscle Pain  . Septra [Sulfamethoxazole-Trimethoprim] Itching    Current Outpatient Medications  Medication Sig Dispense Refill  . benazepril (LOTENSIN) 40 MG tablet Take by mouth.    . diclofenac Sodium (VOLTAREN) 1 % GEL Apply topically.    . diltiazem (CARDIZEM CD) 180 MG 24 hr capsule Take by mouth.    . acetaminophen (TYLENOL) 325 MG tablet Take 650 mg by mouth every 6 (six) hours as needed.    . amitriptyline (ELAVIL) 25 MG tablet Take 1 tablet by mouth 1 day or 1 dose.    . aspirin 325 MG tablet Take 325 mg by mouth as needed.     . benazepril (LOTENSIN) 10 MG tablet Take 10 mg by mouth daily.    . Cholecalciferol (VITAMIN D-3) 1000 UNITS CAPS Take 1 capsule by mouth daily.    . Cholecalciferol (VITAMIN D3) 50 MCG (2000 UT) capsule Take by mouth.    . cyanocobalamin 100 MCG tablet Take 1 tablet by mouth 1 day or 1 dose.    . diclofenac sodium (VOLTAREN) 1 % GEL SMARTSIG:2 Milligram(s) Topical 3 Times Daily PRN    . diltiazem (CARDIZEM) 30 MG tablet Take 30 mg by mouth once.    . diphenoxylate-atropine (LOMOTIL) 2.5-0.025 MG per tablet Take 2 tablets by mouth as needed for diarrhea or loose stools. Reported on 09/11/2015    . loratadine (CLARITIN) 10 MG tablet Take 10 mg by mouth daily as needed. Reported on 09/11/2015    . metoprolol succinate (TOPROL-XL) 25 MG 24 hr tablet Take 25 mg by mouth daily.    . MULTIPLE VITAMIN PO Take by mouth.    . Multiple Vitamins-Minerals (MULTIVITAMIN ADULT PO) Take 1 tablet by mouth 1 day or 1 dose.    . predniSONE (DELTASONE) 5 MG tablet Take 5 mg by mouth daily.    . Probiotic Product (ALIGN) 4 MG CAPS Take 1 capsule by mouth 1 day or 1 dose.    . tiZANidine (ZANAFLEX) 4 MG tablet Take 1 tablet by mouth 1 day  or 1 dose.    . vitamin B-12 (CYANOCOBALAMIN) 500 MCG tablet Take by mouth.     No current facility-administered medications for this visit.     OBJECTIVE: There were no vitals filed for this visit.   There is no height or weight on file to calculate BMI.    ECOG FS:0 - Asymptomatic  General: Well-developed, well-nourished, no acute distress. HEENT: Normocephalic. Neuro: Alert, answering all questions appropriately. Cranial nerves grossly intact. Psych: Normal affect.  LAB RESULTS:  Lab Results  Component Value Date   NA 141 12/21/2018   K 3.9 12/21/2018   CL 106 12/21/2018   CO2   27 12/21/2018   GLUCOSE 102 (H) 12/21/2018   BUN 35 (H) 12/21/2018   CREATININE 1.29 (H) 12/21/2018   CALCIUM 9.0 12/21/2018   PROT 6.2 02/16/2015   ALBUMIN 4.0 02/16/2015   AST 14 02/16/2015   ALT 13 02/16/2015   ALKPHOS 40 02/16/2015   BILITOT 0.7 02/16/2015   GFRNONAA 39 (L) 12/21/2018   GFRAA 45 (L) 12/21/2018    Lab Results  Component Value Date   WBC 12.4 (H) 07/04/2019   NEUTROABS 8.1 (H) 07/04/2019   HGB 12.2 07/04/2019   HCT 36.3 07/04/2019   MCV 95.8 07/04/2019   PLT 325 07/04/2019     STUDIES: Ct Bone Marrow Biopsy & Aspiration  Result Date: 07/04/2019 INDICATION: Bends Jones proteinuria. Concern for underlying multiple myeloma. Please perform CT-guided bone marrow biopsy for tissue diagnostic purposes. EXAM: CT-GUIDED BONE MARROW BIOPSY AND ASPIRATION MEDICATIONS: None ANESTHESIA/SEDATION: Fentanyl 100 mcg IV; Versed 2 mg IV Sedation Time: 12 Minutes; The patient was continuously monitored during the procedure by the interventional radiology nurse under my direct supervision. COMPLICATIONS: None immediate. PROCEDURE: Informed consent was obtained from the patient following an explanation of the procedure, risks, benefits and alternatives. The patient understands, agrees and consents for the procedure. All questions were addressed. A time out was performed prior to the initiation  of the procedure. The patient was positioned prone and non-contrast localization CT was performed of the pelvis to demonstrate the iliac marrow spaces. The operative site was prepped and draped in the usual sterile fashion. Under sterile conditions and local anesthesia, a 22 gauge spinal needle was utilized for procedural planning. Next, an 11 gauge coaxial bone biopsy needle was advanced into the left iliac marrow space. Needle position was confirmed with CT imaging. Initially, bone marrow aspiration was performed. Next, a bone marrow biopsy was obtained with the 11 gauge outer bone marrow device. The 11 gauge coaxial bone biopsy needle was re-advanced into 2 slightly different locations within the left iliac marrow space, positioning was confirmed and additional bone marrow biopsies were obtained. Samples were prepared with the cytotechnologist and deemed adequate. The needle was removed intact. Hemostasis was obtained with compression and a dressing was placed. The patient tolerated the procedure well without immediate post procedural complication. IMPRESSION: Successful CT guided left iliac bone marrow aspiration and core biopsy. Electronically Signed   By: Sandi Mariscal M.D.   On: 07/04/2019 12:08    ASSESSMENT: MGUS, Bence-Jones proteinuria  PLAN:    1.  MGUS, Bence-Jones proteinuria: Although patient's bone marrow biopsy results are only preliminary at time of dictation, it only revealed 20 to 25% plasma cells confirming diagnosis of MGUS.  Previously, 24-hour UPEP revealed only 0.182 g of monoclonal protein.  She does not have an M spike in her serum.  (For patient to be considered a smoldering myeloma she would have to have greater than 0.5 g of monoclonal protein in her urine over 24-hours or 10 to 60% plasma cells in her bone marrow without endorgan damage).  Kappa free light chains are significantly elevated at 662 consistent with a light chain MGUS.  She has a mild renal insufficiency at 1.29, but  no other evidence of endorgan damage.  IgG, IgM, and IgA are not elevated.  No intervention is needed at this time.  Return to clinic in 6 months with laboratory work and further evaluation. 2.  Chronic renal insufficiency: Bone marrow biopsy results as above.  Results confirmed diagnosis of MGUS therefore unlikely related to her worsening kidney  function.  Patient has been instructed to keep her previously scheduled follow-up with nephrology for further evaluation.    I provided 25 minutes of face-to-face video visit time during this encounter, and > 50% was spent counseling as documented under my assessment & plan.    Timothy J Finnegan, MD 07/13/2019 6:36 AM       

## 2019-07-11 ENCOUNTER — Encounter: Payer: Self-pay | Admitting: Oncology

## 2019-07-11 NOTE — Progress Notes (Signed)
Patient prescreened for appointment. Patient has no concerns or questions.  

## 2019-07-12 ENCOUNTER — Inpatient Hospital Stay: Payer: Medicare Other | Attending: Oncology | Admitting: Oncology

## 2019-07-12 ENCOUNTER — Ambulatory Visit: Payer: Medicare Other | Admitting: Oncology

## 2019-07-12 DIAGNOSIS — R803 Bence Jones proteinuria: Secondary | ICD-10-CM | POA: Diagnosis not present

## 2019-07-14 ENCOUNTER — Encounter (HOSPITAL_COMMUNITY): Payer: Self-pay | Admitting: Oncology

## 2019-07-15 LAB — SURGICAL PATHOLOGY

## 2020-01-05 ENCOUNTER — Other Ambulatory Visit: Payer: Self-pay

## 2020-01-05 ENCOUNTER — Inpatient Hospital Stay: Payer: Medicare Other | Attending: Oncology

## 2020-01-05 DIAGNOSIS — R803 Bence Jones proteinuria: Secondary | ICD-10-CM

## 2020-01-05 LAB — BASIC METABOLIC PANEL
Anion gap: 8 (ref 5–15)
BUN: 31 mg/dL — ABNORMAL HIGH (ref 8–23)
CO2: 26 mmol/L (ref 22–32)
Calcium: 8.8 mg/dL — ABNORMAL LOW (ref 8.9–10.3)
Chloride: 106 mmol/L (ref 98–111)
Creatinine, Ser: 1.44 mg/dL — ABNORMAL HIGH (ref 0.44–1.00)
GFR calc Af Amer: 39 mL/min — ABNORMAL LOW (ref >60)
GFR calc non Af Amer: 34 mL/min — ABNORMAL LOW (ref >60)
Glucose, Bld: 122 mg/dL — ABNORMAL HIGH (ref 70–99)
Potassium: 4 mmol/L (ref 3.5–5.1)
Sodium: 140 mmol/L (ref 135–145)

## 2020-01-05 LAB — CBC WITH DIFFERENTIAL/PLATELET
Abs Immature Granulocytes: 0.15 10*3/uL — ABNORMAL HIGH (ref 0.00–0.07)
Basophils Absolute: 0.1 10*3/uL (ref 0.0–0.1)
Basophils Relative: 1 %
Eosinophils Absolute: 0.4 10*3/uL (ref 0.0–0.5)
Eosinophils Relative: 4 %
HCT: 36.8 % (ref 36.0–46.0)
Hemoglobin: 12.2 g/dL (ref 12.0–15.0)
Immature Granulocytes: 1 %
Lymphocytes Relative: 18 %
Lymphs Abs: 2.3 10*3/uL (ref 0.7–4.0)
MCH: 31.5 pg (ref 26.0–34.0)
MCHC: 33.2 g/dL (ref 30.0–36.0)
MCV: 95.1 fL (ref 80.0–100.0)
Monocytes Absolute: 0.8 10*3/uL (ref 0.1–1.0)
Monocytes Relative: 7 %
Neutro Abs: 8.9 10*3/uL — ABNORMAL HIGH (ref 1.7–7.7)
Neutrophils Relative %: 69 %
Platelets: 377 10*3/uL (ref 150–400)
RBC: 3.87 MIL/uL (ref 3.87–5.11)
RDW: 14.9 % (ref 11.5–15.5)
WBC: 12.7 10*3/uL — ABNORMAL HIGH (ref 4.0–10.5)
nRBC: 0 % (ref 0.0–0.2)

## 2020-01-06 NOTE — Progress Notes (Signed)
[name]  Telephone:([phone] Fax:([phone]  HEMATOLOGY-ONCOLOGY TELEMEDICINE VISIT PROGRESS NOTE  ID: Marilyn Sharp OB: [birth year]  MR#: [id number]  ACZ#:[id number]  Patient Care Team: Idelle Crouch, MD as PCP - General (Internal Medicine)  I connected with Marilyn Sharp on 01/13/20 at  2:45 PM EDT by video enabled telemedicine visit and verified that I am speaking with the correct person using two identifiers.   I discussed the limitations, risks, security and privacy concerns of performing an evaluation and management service by telemedicine and the availability of in-person appointments. I also discussed with the patient that there may be a patient responsible charge related to this service. The patient expressed understanding and agreed to proceed.   Other persons participating in the visit and their role in the encounter: Patient, MD.  Patient's location: Home. Provider's location: Clinic.  CHIEF COMPLAINT: Smoldering kappa chain myeloma, Bence-Jones proteinuria.  INTERVAL HISTORY: Patient agreed to video enabled telemedicine visit for further evaluation and discussion of her laboratory results.  She continues to feel well and remains asymptomatic. She has no neurologic complaints.  She denies any recent fevers or illnesses.  She has a good appetite and denies weight loss.  She has no chest pain, shortness of breath, cough, or hemoptysis.  She has no nausea, vomiting, constipation, or diarrhea.  She has no urinary complaints.  Patient offers no specific complaints today.  REVIEW OF SYSTEMS:   Review of Systems  Constitutional: Negative.  Negative for fever, malaise/fatigue and weight loss.  Respiratory: Negative.  Negative for cough, hemoptysis and shortness of breath.   Cardiovascular: Negative.  Negative for chest pain and leg swelling.  Gastrointestinal: Negative.  Negative for abdominal pain.  Genitourinary: Negative.  Negative for dysuria  and hematuria.  Musculoskeletal: Negative.  Negative for back pain.  Skin: Negative.  Negative for rash.  Neurological: Negative.  Negative for dizziness, focal weakness, weakness and headaches.  Psychiatric/Behavioral: Negative.  The patient is not nervous/anxious.     As per HPI. Otherwise, a complete review of systems is negative.  PAST MEDICAL HISTORY: Past Medical History:  Diagnosis Date  . Allergy   . Anxiety   . Arthritis   . Chicken pox   . Depression   . Family history of polyps in the colon   . GERD (gastroesophageal reflux disease)   . Headache   . Heart murmur   . Heart palpitations   . Hyperlipidemia   . Hypertension   . Kidney stone   . Shingles 2012  . Stress fracture of lumbar vertebra     PAST SURGICAL HISTORY: Past Surgical History:  Procedure Laterality Date  . bilateral blephoplasty  2001  . BREAST CYST ASPIRATION  2001  . BREAST SURGERY Left    benign cyst  . CATARACT EXTRACTION, BILATERAL  2007  . EYE SURGERY    . KYPHOPLASTY N/A 09/11/2015   Procedure: KYPHOPLASTY T12;  Surgeon: Hessie Knows, MD;  Location: [facility] ORS;  Service: Orthopedics;  Laterality: N/A;  . RHINOPLASTY      FAMILY HISTORY: Family History  Problem Relation Age of Onset  . Arthritis Mother   . Hypertension Mother   . Osteoporosis Mother   . COPD Father   . Arthritis Maternal Grandmother   . Heart disease Maternal Grandfather   . Colon cancer Neg Hx   . Breast cancer Neg Hx     ADVANCED DIRECTIVES (Y/N):  N  HEALTH MAINTENANCE: Social History   Tobacco Use  .  Smoking status: Former Smoker    Packs/day: 1.00    Years: 20.00    Pack years: 20.00    Types: Cigarettes    Quit date: 08/11/1973    Years since quitting: 46.4  . Smokeless tobacco: Never Used  Substance Use Topics  . Alcohol use: Yes    Alcohol/week: 2.0 standard drinks    Types: 2 Glasses of wine per week  . Drug use: No     Colonoscopy:  PAP:  Bone density:  Lipid panel:  Allergies    Allergen Reactions  . Alendronate     Other reaction(s): Other (See Comments) Chest pain  . Amlodipine Besylate Other (See Comments)    "dizzy"  . Ciprofloxacin Hives  . Codeine Itching  . Lovastatin     Other reaction(s): Muscle Pain  . Septra [Sulfamethoxazole-Trimethoprim] Itching    Current Outpatient Medications  Medication Sig Dispense Refill  . acetaminophen (TYLENOL) 325 MG tablet Take 650 mg by mouth every 6 (six) hours as needed.    . benazepril (LOTENSIN) 40 MG tablet Take by mouth.    . Cholecalciferol (VITAMIN D3) 50 MCG (2000 UT) capsule Take by mouth.    . diclofenac sodium (VOLTAREN) 1 % GEL SMARTSIG:2 Milligram(s) Topical 3 Times Daily PRN    . diltiazem (TIAZAC) 180 MG 24 hr capsule Take 180 mg by mouth daily.    Marland Kitchen loratadine (CLARITIN) 10 MG tablet Take 10 mg by mouth daily as needed. Reported on 09/11/2015    . Multiple Vitamins-Minerals (MULTIVITAMIN ADULT PO) Take 1 tablet by mouth daily.     . vitamin B-12 (CYANOCOBALAMIN) 500 MCG tablet Take 500 mcg by mouth daily.     No current facility-administered medications for this visit.    OBJECTIVE: There were no vitals filed for this visit.   There is no height or weight on file to calculate BMI.    ECOG FS:0 - Asymptomatic  General: Well-developed, well-nourished, no acute distress. HEENT: Normocephalic. Neuro: Alert, answering all questions appropriately. Cranial nerves grossly intact. Psych: Normal affect.  LAB RESULTS:  Lab Results  Component Value Date   NA 140 01/05/2020   K 4.0 01/05/2020   CL 106 01/05/2020   CO2 26 01/05/2020   GLUCOSE 122 (H) 01/05/2020   BUN 31 (H) 01/05/2020   CREATININE 1.44 (H) 01/05/2020   CALCIUM 8.8 (L) 01/05/2020   PROT 6.2 02/16/2015   ALBUMIN 4.0 02/16/2015   AST 14 02/16/2015   ALT 13 02/16/2015   ALKPHOS 40 02/16/2015   BILITOT 0.7 02/16/2015   GFRNONAA 34 (L) 01/05/2020   GFRAA 39 (L) 01/05/2020    Lab Results  Component Value Date   WBC 12.7 (H)  01/05/2020   NEUTROABS 8.9 (H) 01/05/2020   HGB 12.2 01/05/2020   HCT 36.8 01/05/2020   MCV 95.1 01/05/2020   PLT 377 01/05/2020     STUDIES: No results found.  ASSESSMENT: Smoldering kappa chain myeloma, Bence-Jones proteinuria  PLAN:    1.  Smoldering kappa chain myeloma, Bence-Jones proteinuria: Bone marrow biopsy on July 04, 2019 revealed a hypercellular marrow with approximately 25% kappa restricted plasmacytosis.  Cytogenetics were reported as normal.  Previously, 24-hour UPEP revealed only 0.182 g of monoclonal protein.  She does not have an M spike in her serum.  (For patient to be considered a smoldering myeloma she would have to have greater than 0.5 g of monoclonal protein in her urine over 24-hours or 10 to 60% plasma cells in her bone marrow without  endorgan damage).  Kappa free light chains are significantly elevated at 660.2.  She continues to have a mild renal insufficiency and her creatinine has trended up to 1.44.  She has no other evidence of endorgan damage.  IgG, IgM, and IgA are not elevated.  No intervention is needed at this time.  Patient expressed interest in continue to minimize follow-up, therefore will return to clinic in 6 months with repeat laboratory work and video assisted telemedicine visit. 2.  Chronic renal insufficiency: Bone marrow biopsy results as above.  Continue follow-up with nephrology as scheduled.  I provided 20 minutes of face-to-face video visit time during this encounter which included chart review, counseling, and coordination of care as documented above.    Lloyd Huger, MD 01/13/2020 6:55 AM

## 2020-01-12 ENCOUNTER — Encounter: Payer: Self-pay | Admitting: Oncology

## 2020-01-12 ENCOUNTER — Other Ambulatory Visit: Payer: Self-pay

## 2020-01-12 ENCOUNTER — Inpatient Hospital Stay: Payer: Medicare Other | Attending: Oncology | Admitting: Oncology

## 2020-01-12 DIAGNOSIS — R803 Bence Jones proteinuria: Secondary | ICD-10-CM | POA: Diagnosis not present

## 2020-01-12 NOTE — Progress Notes (Signed)
Patient stated she would like to discuss Bone Marrow Biopsy. She states she has not been back to kidney provider and would like to discuss how her kidneys are doing.

## 2020-01-25 ENCOUNTER — Other Ambulatory Visit: Payer: Self-pay | Admitting: Internal Medicine

## 2020-01-25 DIAGNOSIS — Z1231 Encounter for screening mammogram for malignant neoplasm of breast: Secondary | ICD-10-CM

## 2020-03-05 ENCOUNTER — Ambulatory Visit
Admission: RE | Admit: 2020-03-05 | Discharge: 2020-03-05 | Disposition: A | Discharge status: Home / Self Care | Payer: Medicare Other | Source: Ambulatory Visit | Attending: Internal Medicine | Admitting: Internal Medicine

## 2020-03-05 DIAGNOSIS — Z1231 Encounter for screening mammogram for malignant neoplasm of breast: Secondary | ICD-10-CM | POA: Diagnosis not present

## 2020-05-03 ENCOUNTER — Other Ambulatory Visit: Payer: Self-pay | Admitting: Internal Medicine

## 2020-05-03 DIAGNOSIS — R55 Syncope and collapse: Secondary | ICD-10-CM

## 2020-05-16 ENCOUNTER — Other Ambulatory Visit: Payer: Self-pay

## 2020-05-16 ENCOUNTER — Ambulatory Visit
Admission: RE | Admit: 2020-05-16 | Discharge: 2020-05-16 | Disposition: A | Discharge status: Home / Self Care | Payer: Medicare Other | Source: Ambulatory Visit | Attending: Internal Medicine | Admitting: Internal Medicine

## 2020-05-16 DIAGNOSIS — R55 Syncope and collapse: Secondary | ICD-10-CM | POA: Insufficient documentation

## 2020-07-09 ENCOUNTER — Inpatient Hospital Stay: Payer: Medicare Other | Attending: Oncology

## 2020-07-09 ENCOUNTER — Other Ambulatory Visit: Payer: Self-pay

## 2020-07-09 DIAGNOSIS — R803 Bence Jones proteinuria: Secondary | ICD-10-CM | POA: Diagnosis present

## 2020-07-09 LAB — BASIC METABOLIC PANEL
Anion gap: 11 (ref 5–15)
BUN: 35 mg/dL — ABNORMAL HIGH (ref 8–23)
CO2: 24 mmol/L (ref 22–32)
Calcium: 9.2 mg/dL (ref 8.9–10.3)
Chloride: 106 mmol/L (ref 98–111)
Creatinine, Ser: 1.37 mg/dL — ABNORMAL HIGH (ref 0.44–1.00)
GFR, Estimated: 39 mL/min — ABNORMAL LOW (ref >60)
Glucose, Bld: 127 mg/dL — ABNORMAL HIGH (ref 70–99)
Potassium: 4.1 mmol/L (ref 3.5–5.1)
Sodium: 141 mmol/L (ref 135–145)

## 2020-07-09 LAB — CBC WITH DIFFERENTIAL/PLATELET
Abs Immature Granulocytes: 0.24 10*3/uL — ABNORMAL HIGH (ref 0.00–0.07)
Basophils Absolute: 0.1 10*3/uL (ref 0.0–0.1)
Basophils Relative: 1 %
Eosinophils Absolute: 0.5 10*3/uL (ref 0.0–0.5)
Eosinophils Relative: 3 %
HCT: 41.8 % (ref 36.0–46.0)
Hemoglobin: 13.4 g/dL (ref 12.0–15.0)
Immature Granulocytes: 2 %
Lymphocytes Relative: 16 %
Lymphs Abs: 2.3 10*3/uL (ref 0.7–4.0)
MCH: 30 pg (ref 26.0–34.0)
MCHC: 32.1 g/dL (ref 30.0–36.0)
MCV: 93.7 fL (ref 80.0–100.0)
Monocytes Absolute: 0.9 10*3/uL (ref 0.1–1.0)
Monocytes Relative: 6 %
Neutro Abs: 10.2 10*3/uL — ABNORMAL HIGH (ref 1.7–7.7)
Neutrophils Relative %: 72 %
Platelets: 395 10*3/uL (ref 150–400)
RBC: 4.46 MIL/uL (ref 3.87–5.11)
RDW: 15.6 % — ABNORMAL HIGH (ref 11.5–15.5)
WBC: 14.2 10*3/uL — ABNORMAL HIGH (ref 4.0–10.5)
nRBC: 0 % (ref 0.0–0.2)

## 2020-07-10 LAB — PROTEIN ELECTRO, RANDOM URINE
Albumin ELP, Urine: 9.6 %
Alpha-1-Globulin, U: 3.2 %
Alpha-2-Globulin, U: 9.9 %
Beta Globulin, U: 72.1 %
Gamma Globulin, U: 5.1 %
M Component, Ur: 61.6 % — ABNORMAL HIGH
Total Protein, Urine: 80.7 mg/dL

## 2020-07-10 LAB — KAPPA/LAMBDA LIGHT CHAINS
Kappa free light chain: 515.7 mg/L — ABNORMAL HIGH (ref 3.3–19.4)
Kappa, lambda light chain ratio: 46.46 — ABNORMAL HIGH (ref 0.26–1.65)
Lambda free light chains: 11.1 mg/L (ref 5.7–26.3)

## 2020-07-14 NOTE — Progress Notes (Signed)
[place]  Telephone:([phone] Fax:([phone]  HEMATOLOGY-ONCOLOGY TELEMEDICINE VISIT PROGRESS NOTE  ID: Marilyn Chancellor Flight OB: 10-20-1935  MR#: [id number]  QPY#:[id number]  Patient Care Team: Idelle Crouch, MD as PCP - General (Internal Medicine)  I connected with Marilyn Sharp on 07/19/20 at  2:45 PM EST by video enabled telemedicine visit and verified that I am speaking with the correct person using two identifiers.   I discussed the limitations, risks, security and privacy concerns of performing an evaluation and management service by telemedicine and the availability of in-person appointments. I also discussed with the patient that there may be a patient responsible charge related to this service. The patient expressed understanding and agreed to proceed.   Other persons participating in the visit and their role in the encounter: Patient, MD.  Patient's location: Home. Provider's location: Clinic.  CHIEF COMPLAINT: Smoldering kappa chain myeloma, Bence-Jones proteinuria.  INTERVAL HISTORY: Patient agreed to video enabled telemedicine visit for further evaluation and discussion of her laboratory results.  She continues to feel well and remains asymptomatic. She has no neurologic complaints.  She denies any recent fevers or illnesses.  She has a good appetite and denies weight loss.  She has no chest pain, shortness of breath, cough, or hemoptysis.  She has no nausea, vomiting, constipation, or diarrhea.  She has no urinary complaints.  Patient feels at her baseline offers no specific complaints today.  REVIEW OF SYSTEMS:   Review of Systems  Constitutional: Negative.  Negative for fever, malaise/fatigue and weight loss.  Respiratory: Negative.  Negative for cough, hemoptysis and shortness of breath.   Cardiovascular: Negative.  Negative for chest pain and leg swelling.  Gastrointestinal: Negative.  Negative for abdominal pain.  Genitourinary: Negative.   Negative for dysuria and hematuria.  Musculoskeletal: Negative.  Negative for back pain.  Skin: Negative.  Negative for rash.  Neurological: Negative.  Negative for dizziness, focal weakness, weakness and headaches.  Psychiatric/Behavioral: Negative.  The patient is not nervous/anxious.     As per HPI. Otherwise, a complete review of systems is negative.  PAST MEDICAL HISTORY: Past Medical History:  Diagnosis Date  . Allergy   . Anxiety   . Arthritis   . Chicken pox   . Depression   . Family history of polyps in the colon   . GERD (gastroesophageal reflux disease)   . Headache   . Heart murmur   . Heart palpitations   . Hyperlipidemia   . Hypertension   . Kidney stone   . Shingles 2012  . Stress fracture of lumbar vertebra     PAST SURGICAL HISTORY: Past Surgical History:  Procedure Laterality Date  . bilateral blephoplasty  2001  . BREAST CYST ASPIRATION  2001  . BREAST SURGERY Left    benign cyst  . CATARACT EXTRACTION, BILATERAL  2007  . EYE SURGERY    . KYPHOPLASTY N/A 09/11/2015   Procedure: KYPHOPLASTY T12;  Surgeon: Hessie Knows, MD;  Location: [facility] ORS;  Service: Orthopedics;  Laterality: N/A;  . RHINOPLASTY      FAMILY HISTORY: Family History  Problem Relation Age of Onset  . Arthritis Mother   . Hypertension Mother   . Osteoporosis Mother   . COPD Father   . Arthritis Maternal Grandmother   . Heart disease Maternal Grandfather   . Colon cancer Neg Hx   . Breast cancer Neg Hx     ADVANCED DIRECTIVES (Y/N):  N  HEALTH MAINTENANCE: Social History  Tobacco Use  . Smoking status: Former Smoker    Packs/day: 1.00    Years: 20.00    Pack years: 20.00    Types: Cigarettes    Quit date: 08/11/1973    Years since quitting: 46.9  . Smokeless tobacco: Never Used  Substance Use Topics  . Alcohol use: Yes    Alcohol/week: 2.0 standard drinks    Types: 2 Glasses of wine per week  . Drug use: No     Colonoscopy:  PAP:  Bone density:  Lipid  panel:  Allergies  Allergen Reactions  . Alendronate     Other reaction(s): Other (See Comments) Chest pain  . Amlodipine Besylate Other (See Comments)    "dizzy"  . Ciprofloxacin Hives  . Codeine Itching  . Lovastatin     Other reaction(s): Muscle Pain  . Septra [Sulfamethoxazole-Trimethoprim] Itching    Current Outpatient Medications  Medication Sig Dispense Refill  . acetaminophen (TYLENOL) 325 MG tablet Take 650 mg by mouth every 6 (six) hours as needed.    . benazepril (LOTENSIN) 40 MG tablet Take by mouth.    . Cholecalciferol (VITAMIN D3) 50 MCG (2000 UT) capsule Take by mouth.    . diclofenac sodium (VOLTAREN) 1 % GEL SMARTSIG:2 Milligram(s) Topical 3 Times Daily PRN    . diltiazem (TIAZAC) 180 MG 24 hr capsule Take 180 mg by mouth daily.    Marland Kitchen loratadine (CLARITIN) 10 MG tablet Take 10 mg by mouth daily as needed. Reported on 09/11/2015    . Multiple Vitamins-Minerals (MULTIVITAMIN ADULT PO) Take 1 tablet by mouth daily.     . vitamin B-12 (CYANOCOBALAMIN) 500 MCG tablet Take 500 mcg by mouth daily.     No current facility-administered medications for this visit.    OBJECTIVE: There were no vitals filed for this visit.   There is no height or weight on file to calculate BMI.    ECOG FS:0 - Asymptomatic  General: Well-developed, well-nourished, no acute distress. HEENT: Normocephalic. Neuro: Alert, answering all questions appropriately. Cranial nerves grossly intact. Psych: Normal affect.  LAB RESULTS:  Lab Results  Component Value Date   NA 141 07/09/2020   K 4.1 07/09/2020   CL 106 07/09/2020   CO2 24 07/09/2020   GLUCOSE 127 (H) 07/09/2020   BUN 35 (H) 07/09/2020   CREATININE 1.37 (H) 07/09/2020   CALCIUM 9.2 07/09/2020   PROT 6.2 02/16/2015   ALBUMIN 4.0 02/16/2015   AST 14 02/16/2015   ALT 13 02/16/2015   ALKPHOS 40 02/16/2015   BILITOT 0.7 02/16/2015   GFRNONAA 39 (L) 07/09/2020   GFRAA 39 (L) 01/05/2020    Lab Results  Component Value Date    WBC 14.2 (H) 07/09/2020   NEUTROABS 10.2 (H) 07/09/2020   HGB 13.4 07/09/2020   HCT 41.8 07/09/2020   MCV 93.7 07/09/2020   PLT 395 07/09/2020     STUDIES: No results found.  ASSESSMENT: Smoldering kappa chain myeloma, Bence-Jones proteinuria  PLAN:    1.  Smoldering kappa chain myeloma, Bence-Jones proteinuria: Bone marrow biopsy on July 04, 2019 revealed a hypercellular marrow with approximately 25% kappa restricted plasmacytosis.  Cytogenetics were reported as normal.  Previously, 24-hour UPEP revealed only 0.182 g of monoclonal protein.  She does not have an M spike in her serum and her immunoglobulins are within normal limits.  (For patient to be considered a smoldering myeloma she would have to have greater than 0.5 g of monoclonal protein in her urine over 24-hours or 10  to 60% plasma cells in her bone marrow without endorgan damage).  Kappa free light chains have trended down slightly to 515.7.  The M spike in her urine is unchanged at 61.6%.  She continues to have a mild renal insufficiency with a creatinine of 1.37 which is slightly improved over previous.  She has no other evidence of endorgan damage.  No intervention is needed at this time.  Return to clinic in 6 months with repeat laboratory work and video assisted telemedicine visit.   2.  Chronic renal insufficiency: Chronic and unchanged.  Continue follow-up with nephrology as needed.  I provided 20 minutes of face-to-face video visit time during this encounter which included chart review, counseling, and coordination of care as documented above.     Lloyd Huger, MD 07/19/2020 6:31 AM

## 2020-07-17 ENCOUNTER — Inpatient Hospital Stay: Payer: Medicare Other | Attending: Oncology | Admitting: Oncology

## 2020-07-17 DIAGNOSIS — R803 Bence Jones proteinuria: Secondary | ICD-10-CM

## 2020-07-17 NOTE — Progress Notes (Signed)
Patient denies any concerns today.  

## 2020-12-03 ENCOUNTER — Other Ambulatory Visit: Payer: Self-pay | Admitting: Internal Medicine

## 2020-12-03 DIAGNOSIS — Z1231 Encounter for screening mammogram for malignant neoplasm of breast: Secondary | ICD-10-CM

## 2021-01-08 ENCOUNTER — Inpatient Hospital Stay: Payer: Medicare Other | Attending: Oncology

## 2021-01-08 ENCOUNTER — Other Ambulatory Visit: Payer: Self-pay | Admitting: *Deleted

## 2021-01-08 ENCOUNTER — Other Ambulatory Visit: Payer: Self-pay

## 2021-01-08 DIAGNOSIS — R803 Bence Jones proteinuria: Secondary | ICD-10-CM | POA: Insufficient documentation

## 2021-01-08 LAB — COMPREHENSIVE METABOLIC PANEL
ALT: 11 U/L (ref 0–44)
AST: 13 U/L — ABNORMAL LOW (ref 15–41)
Albumin: 4 g/dL (ref 3.5–5.0)
Alkaline Phosphatase: 57 U/L (ref 38–126)
Anion gap: 11 (ref 5–15)
BUN: 29 mg/dL — ABNORMAL HIGH (ref 8–23)
CO2: 24 mmol/L (ref 22–32)
Calcium: 9.1 mg/dL (ref 8.9–10.3)
Chloride: 104 mmol/L (ref 98–111)
Creatinine, Ser: 1.22 mg/dL — ABNORMAL HIGH (ref 0.44–1.00)
GFR, Estimated: 44 mL/min — ABNORMAL LOW (ref >60)
Glucose, Bld: 101 mg/dL — ABNORMAL HIGH (ref 70–99)
Potassium: 3.9 mmol/L (ref 3.5–5.1)
Sodium: 139 mmol/L (ref 135–145)
Total Bilirubin: 0.7 mg/dL (ref 0.3–1.2)
Total Protein: 6.4 g/dL — ABNORMAL LOW (ref 6.5–8.1)

## 2021-01-08 LAB — CBC
HCT: 43 % (ref 36.0–46.0)
Hemoglobin: 14.2 g/dL (ref 12.0–15.0)
MCH: 31.6 pg (ref 26.0–34.0)
MCHC: 33 g/dL (ref 30.0–36.0)
MCV: 95.6 fL (ref 80.0–100.0)
Platelets: 388 10*3/uL (ref 150–400)
RBC: 4.5 MIL/uL (ref 3.87–5.11)
RDW: 15.8 % — ABNORMAL HIGH (ref 11.5–15.5)
WBC: 14.6 10*3/uL — ABNORMAL HIGH (ref 4.0–10.5)
nRBC: 0 % (ref 0.0–0.2)

## 2021-01-09 LAB — KAPPA/LAMBDA LIGHT CHAINS
Kappa free light chain: 510.1 mg/L — ABNORMAL HIGH (ref 3.3–19.4)
Kappa, lambda light chain ratio: 38.07 — ABNORMAL HIGH (ref 0.26–1.65)
Lambda free light chains: 13.4 mg/L (ref 5.7–26.3)

## 2021-01-09 LAB — IGG, IGA, IGM
IgA: 111 mg/dL (ref 64–422)
IgG (Immunoglobin G), Serum: 681 mg/dL (ref 586–1602)
IgM (Immunoglobulin M), Srm: 10 mg/dL — ABNORMAL LOW (ref 26–217)

## 2021-01-10 LAB — PROTEIN ELECTRO, RANDOM URINE: Total Protein, Urine: 126.1 mg/dL

## 2021-01-11 NOTE — Progress Notes (Signed)
[place]  Telephone:([phone] Fax:([phone]  HEMATOLOGY-ONCOLOGY TELEMEDICINE VISIT PROGRESS NOTE  ID: Marilyn Sharp OB: 05-11-1936  MR#: [id number]  TSV#:[id number]  Patient Care Team: Idelle Crouch, MD as PCP - General (Internal Medicine)  I connected with Marilyn Sharp on 01/16/21 at  2:45 PM EDT by video enabled telemedicine visit and verified that I am speaking with the correct person using two identifiers.   I discussed the limitations, risks, security and privacy concerns of performing an evaluation and management service by telemedicine and the availability of in-person appointments. I also discussed with the patient that there may be a patient responsible charge related to this service. The patient expressed understanding and agreed to proceed.   Other persons participating in the visit and their role in the encounter: Patient, MD.  Patient's location: Home. Provider's location: Clinic.  CHIEF COMPLAINT: Smoldering kappa chain myeloma, Bence-Jones proteinuria.  INTERVAL HISTORY: Patient agreed to video assisted telemedicine visit for further evaluation and discussion of her laboratory results.  She continues to feel well and remains asymptomatic.  She continues to remain active.  She has no neurologic complaints.  She denies any recent fevers or illnesses.  She has a good appetite and denies weight loss.  She has no chest pain, shortness of breath, cough, or hemoptysis.  She has no nausea, vomiting, constipation, or diarrhea.  She has no urinary complaints.  Patient offers no specific complaints today.    REVIEW OF SYSTEMS:   Review of Systems  Constitutional: Negative.  Negative for fever, malaise/fatigue and weight loss.  Respiratory: Negative.  Negative for cough, hemoptysis and shortness of breath.   Cardiovascular: Negative.  Negative for chest pain and leg swelling.  Gastrointestinal: Negative.  Negative for abdominal pain.   Genitourinary: Negative.  Negative for dysuria and hematuria.  Musculoskeletal: Negative.  Negative for back pain.  Skin: Negative.  Negative for rash.  Neurological: Negative.  Negative for dizziness, focal weakness, weakness and headaches.  Psychiatric/Behavioral: Negative.  The patient is not nervous/anxious.     As per HPI. Otherwise, a complete review of systems is negative.  PAST MEDICAL HISTORY: Past Medical History:  Diagnosis Date  . Allergy   . Anxiety   . Arthritis   . Chicken pox   . Depression   . Family history of polyps in the colon   . GERD (gastroesophageal reflux disease)   . Headache   . Heart murmur   . Heart palpitations   . Hyperlipidemia   . Hypertension   . Kidney stone   . Shingles 2012  . Stress fracture of lumbar vertebra     PAST SURGICAL HISTORY: Past Surgical History:  Procedure Laterality Date  . bilateral blephoplasty  2001  . BREAST CYST ASPIRATION  2001  . BREAST SURGERY Left    benign cyst  . CATARACT EXTRACTION, BILATERAL  2007  . EYE SURGERY    . KYPHOPLASTY N/A 09/11/2015   Procedure: KYPHOPLASTY T12;  Surgeon: Hessie Knows, MD;  Location: [facility] ORS;  Service: Orthopedics;  Laterality: N/A;  . RHINOPLASTY      FAMILY HISTORY: Family History  Problem Relation Age of Onset  . Arthritis Mother   . Hypertension Mother   . Osteoporosis Mother   . COPD Father   . Arthritis Maternal Grandmother   . Heart disease Maternal Grandfather   . Colon cancer Neg Hx   . Breast cancer Neg Hx     ADVANCED DIRECTIVES (Y/N):  N  HEALTH  MAINTENANCE: Social History   Tobacco Use  . Smoking status: Former Smoker    Packs/day: 1.00    Years: 20.00    Pack years: 20.00    Types: Cigarettes    Quit date: 08/11/1973    Years since quitting: 47.4  . Smokeless tobacco: Never Used  Substance Use Topics  . Alcohol use: Yes    Alcohol/week: 2.0 standard drinks    Types: 2 Glasses of wine per week  . Drug use: No      Colonoscopy:  PAP:  Bone density:  Lipid panel:  Allergies  Allergen Reactions  . Alendronate     Other reaction(s): Other (See Comments) Chest pain  . Amlodipine Besylate Other (See Comments)    "dizzy"  . Ciprofloxacin Hives  . Codeine Itching  . Lovastatin     Other reaction(s): Muscle Pain  . Septra [Sulfamethoxazole-Trimethoprim] Itching    Current Outpatient Medications  Medication Sig Dispense Refill  . acetaminophen (TYLENOL) 325 MG tablet Take 650 mg by mouth every 6 (six) hours as needed.    . benazepril (LOTENSIN) 40 MG tablet Take by mouth.    . Cholecalciferol (VITAMIN D3) 50 MCG (2000 UT) capsule Take by mouth.    . diltiazem (TIAZAC) 180 MG 24 hr capsule Take 180 mg by mouth daily.    . hydrALAZINE (APRESOLINE) 25 MG tablet Take 25 mg by mouth 2 (two) times daily.    Marland Kitchen loratadine (CLARITIN) 10 MG tablet Take 10 mg by mouth daily as needed. Reported on 09/11/2015    . vitamin B-12 (CYANOCOBALAMIN) 500 MCG tablet Take 500 mcg by mouth daily.     No current facility-administered medications for this visit.    OBJECTIVE: There were no vitals filed for this visit.   There is no height or weight on file to calculate BMI.    ECOG FS:0 - Asymptomatic  General: Well-developed, well-nourished, no acute distress. HEENT: Normocephalic. Neuro: Alert, answering all questions appropriately. Cranial nerves grossly intact. Psych: Normal affect.  LAB RESULTS:  Lab Results  Component Value Date   NA 139 01/08/2021   K 3.9 01/08/2021   CL 104 01/08/2021   CO2 24 01/08/2021   GLUCOSE 101 (H) 01/08/2021   BUN 29 (H) 01/08/2021   CREATININE 1.22 (H) 01/08/2021   CALCIUM 9.1 01/08/2021   PROT 6.4 (L) 01/08/2021   ALBUMIN 4.0 01/08/2021   AST 13 (L) 01/08/2021   ALT 11 01/08/2021   ALKPHOS 57 01/08/2021   BILITOT 0.7 01/08/2021   GFRNONAA 44 (L) 01/08/2021   GFRAA 39 (L) 01/05/2020    Lab Results  Component Value Date   WBC 14.6 (H) 01/08/2021   NEUTROABS  10.2 (H) 07/09/2020   HGB 14.2 01/08/2021   HCT 43.0 01/08/2021   MCV 95.6 01/08/2021   PLT 388 01/08/2021     STUDIES: No results found.  ASSESSMENT: Smoldering kappa chain myeloma, Bence-Jones proteinuria  PLAN:    1.  Smoldering kappa chain myeloma, Bence-Jones proteinuria: Bone marrow biopsy on July 04, 2019 revealed a hypercellular marrow with approximately 25% kappa restricted plasmacytosis.  Cytogenetics were reported as normal.  Previously, 24-hour UPEP revealed only 0.182 g of monoclonal protein.  She does not have an M spike in her serum and her immunoglobulins are within normal limits.  (For patient to be considered a smoldering myeloma she would have to have greater than 0.5 g of monoclonal protein in her urine over 24-hours or 10 to 60% plasma cells in her bone marrow  without endorgan damage).  Kappa free light chains were 660.2 in May 2020.  They have trended down slightly to 510.1.  Urine testing was insufficient quantity, but previously the M spike in her urine is unchanged at 61.6%.  She continues to have a mild renal insufficiency, but this has improved with a creatinine of 1.22.  She has no other evidence of endorgan damage.  No intervention is needed at this time.  Return to clinic in 6 months with repeat laboratory work and video assisted telemedicine visit.   2.  Chronic renal insufficiency: Mildly improved.  Continue to follow-up with nephrology as needed.  I provided 20 minutes of face-to-face video visit time during this encounter which included chart review, counseling, and coordination of care as documented above.     Lloyd Huger, MD 01/16/2021 12:37 PM

## 2021-01-15 ENCOUNTER — Encounter: Payer: Self-pay | Admitting: Oncology

## 2021-01-15 ENCOUNTER — Inpatient Hospital Stay: Payer: Medicare Other | Attending: Oncology | Admitting: Oncology

## 2021-01-15 ENCOUNTER — Telehealth: Payer: Medicare Other | Admitting: Oncology

## 2021-01-15 DIAGNOSIS — R803 Bence Jones proteinuria: Secondary | ICD-10-CM

## 2021-01-15 NOTE — Progress Notes (Signed)
Patient denies any concerns today.  

## 2021-03-07 ENCOUNTER — Other Ambulatory Visit: Payer: Self-pay

## 2021-03-07 ENCOUNTER — Ambulatory Visit
Admission: RE | Admit: 2021-03-07 | Discharge: 2021-03-07 | Disposition: A | Discharge status: Home / Self Care | Payer: Medicare Other | Source: Ambulatory Visit | Attending: Internal Medicine | Admitting: Internal Medicine

## 2021-03-07 DIAGNOSIS — Z1231 Encounter for screening mammogram for malignant neoplasm of breast: Secondary | ICD-10-CM

## 2021-04-16 ENCOUNTER — Emergency Department
Admission: EM | Admit: 2021-04-16 | Discharge: 2021-04-16 | Disposition: A | Discharge status: Home / Self Care | Payer: Medicare Other | Attending: Emergency Medicine | Admitting: Emergency Medicine

## 2021-04-16 ENCOUNTER — Emergency Department: Payer: Medicare Other

## 2021-04-16 ENCOUNTER — Other Ambulatory Visit: Payer: Self-pay

## 2021-04-16 ENCOUNTER — Encounter: Payer: Self-pay | Admitting: Emergency Medicine

## 2021-04-16 DIAGNOSIS — Z87891 Personal history of nicotine dependence: Secondary | ICD-10-CM | POA: Insufficient documentation

## 2021-04-16 DIAGNOSIS — R1032 Left lower quadrant pain: Secondary | ICD-10-CM | POA: Diagnosis present

## 2021-04-16 DIAGNOSIS — Z79899 Other long term (current) drug therapy: Secondary | ICD-10-CM | POA: Diagnosis not present

## 2021-04-16 DIAGNOSIS — K625 Hemorrhage of anus and rectum: Secondary | ICD-10-CM | POA: Diagnosis not present

## 2021-04-16 DIAGNOSIS — Z20822 Contact with and (suspected) exposure to covid-19: Secondary | ICD-10-CM | POA: Diagnosis not present

## 2021-04-16 DIAGNOSIS — I129 Hypertensive chronic kidney disease with stage 1 through stage 4 chronic kidney disease, or unspecified chronic kidney disease: Secondary | ICD-10-CM | POA: Diagnosis not present

## 2021-04-16 DIAGNOSIS — K529 Noninfective gastroenteritis and colitis, unspecified: Secondary | ICD-10-CM

## 2021-04-16 DIAGNOSIS — N183 Chronic kidney disease, stage 3 unspecified: Secondary | ICD-10-CM | POA: Insufficient documentation

## 2021-04-16 LAB — COMPREHENSIVE METABOLIC PANEL
ALT: 12 U/L (ref 0–44)
AST: 14 U/L — ABNORMAL LOW (ref 15–41)
Albumin: 4 g/dL (ref 3.5–5.0)
Alkaline Phosphatase: 58 U/L (ref 38–126)
Anion gap: 8 (ref 5–15)
BUN: 33 mg/dL — ABNORMAL HIGH (ref 8–23)
CO2: 23 mmol/L (ref 22–32)
Calcium: 9.1 mg/dL (ref 8.9–10.3)
Chloride: 106 mmol/L (ref 98–111)
Creatinine, Ser: 1.29 mg/dL — ABNORMAL HIGH (ref 0.44–1.00)
GFR, Estimated: 41 mL/min — ABNORMAL LOW (ref >60)
Glucose, Bld: 111 mg/dL — ABNORMAL HIGH (ref 70–99)
Potassium: 4.1 mmol/L (ref 3.5–5.1)
Sodium: 137 mmol/L (ref 135–145)
Total Bilirubin: 0.8 mg/dL (ref 0.3–1.2)
Total Protein: 6.5 g/dL (ref 6.5–8.1)

## 2021-04-16 LAB — CBC
HCT: 44.8 % (ref 36.0–46.0)
Hemoglobin: 15 g/dL (ref 12.0–15.0)
MCH: 31.9 pg (ref 26.0–34.0)
MCHC: 33.5 g/dL (ref 30.0–36.0)
MCV: 95.3 fL (ref 80.0–100.0)
Platelets: 457 10*3/uL — ABNORMAL HIGH (ref 150–400)
RBC: 4.7 MIL/uL (ref 3.87–5.11)
RDW: 14.7 % (ref 11.5–15.5)
WBC: 16.7 10*3/uL — ABNORMAL HIGH (ref 4.0–10.5)
nRBC: 0 % (ref 0.0–0.2)

## 2021-04-16 LAB — URINALYSIS, COMPLETE (UACMP) WITH MICROSCOPIC
Bacteria, UA: NONE SEEN
Bilirubin Urine: NEGATIVE
Glucose, UA: NEGATIVE mg/dL
Hgb urine dipstick: NEGATIVE
Ketones, ur: NEGATIVE mg/dL
Leukocytes,Ua: NEGATIVE
Nitrite: NEGATIVE
Protein, ur: 100 mg/dL — AB
Specific Gravity, Urine: 1.025 (ref 1.005–1.030)
pH: 5.5 (ref 5.0–8.0)

## 2021-04-16 LAB — LIPASE, BLOOD: Lipase: 37 U/L (ref 11–51)

## 2021-04-16 LAB — RESP PANEL BY RT-PCR (FLU A&B, COVID) ARPGX2
Influenza A by PCR: NEGATIVE
Influenza B by PCR: NEGATIVE
SARS Coronavirus 2 by RT PCR: NEGATIVE

## 2021-04-16 MED ORDER — IOHEXOL 9 MG/ML PO SOLN
500.0000 mL | ORAL | Status: AC
Start: 2021-04-16 — End: 2021-04-16
  Administered 2021-04-16: 500 mL via ORAL

## 2021-04-16 MED ORDER — LACTATED RINGERS IV BOLUS
500.0000 mL | Freq: Once | INTRAVENOUS | Status: AC
  Administered 2021-04-16: 500 mL via INTRAVENOUS

## 2021-04-16 MED ORDER — IOHEXOL 350 MG/ML SOLN
50.0000 mL | Freq: Once | INTRAVENOUS | Status: AC | PRN
  Administered 2021-04-16: 50 mL via INTRAVENOUS

## 2021-04-16 MED ORDER — AZITHROMYCIN 500 MG PO TABS: 500.0000 mg | ORAL_TABLET | Freq: Every day | ORAL | 0 refills | Status: AC

## 2021-04-16 MED ORDER — AZITHROMYCIN 500 MG PO TABS
500.0000 mg | ORAL_TABLET | Freq: Once | ORAL | Status: AC
  Administered 2021-04-16: 500 mg via ORAL
  Filled 2021-04-16: qty 1

## 2021-04-16 NOTE — ED Triage Notes (Signed)
Pt reports that she found bright red blood in her stool. This was the first time she seen it. She did think that last week she had diarrhea and thought she may have food poisoning but that has since cleared up.

## 2021-04-16 NOTE — ED Provider Notes (Signed)
[facility] Emergency Department Provider Note ____________________________________________   Event Date/Time   First MD Initiated Contact with Patient 04/16/21 2053     (approximate)  I have reviewed the triage vital signs and the nursing notes.   HISTORY  Chief Complaint Rectal Bleeding  HPI Marilyn Sharp is a 85 y.o. female history of hypertension hyperlipidemia prior kidney stones  On Friday patient felt like she had "food poisoning" she had crampy abdominal pain couple of very loose watery stools.  She has been unable to keep fluids down taking soup since then has been having off-and-on crampy discomfort but not really pain anywhere.  No fevers or chills no chest pain no trouble breathing.  No cough.  Reports that she would not of come to the ER except for the fact that she had 1 normal formed bowel movements a day which is typical but noted to have what appeared to be small areas of red or dark blood clot on it.  She does still continue to have some intermittent crampy discomfort in her lower abdomen a little more in the left lower  Currently not nauseated does not wish for any pain medicine reports the pain comes is kind of waves of gassy discomfort in the lower abdomen  No urinary symptoms no pain or burning with urination.  No history of blood in her stool ever in the past.  No previous abdominal surgeries  Past Medical History:  Diagnosis Date   Allergy    Anxiety    Arthritis    Chicken pox    Depression    Family history of polyps in the colon    GERD (gastroesophageal reflux disease)    Headache    Heart murmur    Heart palpitations    Hyperlipidemia    Hypertension    Kidney stone    Shingles 2012   Stress fracture of lumbar vertebra     Patient Active Problem List   Diagnosis Date Noted   Acute kidney failure, unspecified ([place]) 05/24/2019   Chronic kidney disease, stage 3 unspecified ([place]) 05/24/2019   Monoclonal gammopathy  05/24/2019   Unspecified hypertensive kidney disease with chronic kidney disease stage I through stage IV, or unspecified 05/24/2019   Bence-Jones proteinuria 12/18/2018   Occult blood positive stool 03/23/2018   Tremor 01/05/2018   Osteoporosis, post-menopausal 01/02/2016   Benign essential HTN 04/23/2015   Shortness of breath 04/23/2015   Dizziness and giddiness 02/16/2015   Counseling regarding end of life decision making 02/16/2015   Acute bronchitis 07/10/2014   Carotid stenosis, asymptomatic, minimal 05/30/2014   Right shoulder pain 10/13/2013   Pain, eye, right 10/13/2013   Major depression in remission ([place]) 06/02/2013   GERD (gastroesophageal reflux disease) 06/02/2013   Allergic rhinitis 06/02/2013   Palpitations 06/02/2013   Mitral valve prolapse 06/02/2013   HTN (hypertension) 06/02/2013   High cholesterol 06/02/2013   Personal history of colonic polyps 06/02/2013   Osteoporosis, unspecified 06/02/2013   Post herpetic neuralgia 06/02/2013    Past Surgical History:  Procedure Laterality Date   bilateral blephoplasty  2001   BREAST CYST ASPIRATION  2001   BREAST SURGERY Left    benign cyst   CATARACT EXTRACTION, BILATERAL  2007   EYE SURGERY     KYPHOPLASTY N/A 09/11/2015   Procedure: KYPHOPLASTY T12;  Surgeon: Hessie Knows, MD;  Location: [facility] ORS;  Service: Orthopedics;  Laterality: N/A;   RHINOPLASTY      Prior to Admission medications  Medication Sig Start Date End Date Taking? Authorizing Provider  azithromycin (ZITHROMAX) 500 MG tablet Take 1 tablet (500 mg total) by mouth daily for 2 days. 04/16/21 04/18/21 Yes Delman Kitten, MD  acetaminophen (TYLENOL) 325 MG tablet Take 650 mg by mouth every 6 (six) hours as needed.    [provider]  benazepril (LOTENSIN) 40 MG tablet Take by mouth. 02/10/19   [provider]  Cholecalciferol (VITAMIN D3) 50 MCG (2000 UT) capsule Take by mouth.    [provider]  diltiazem (TIAZAC) 180 MG 24 hr  capsule Take 180 mg by mouth daily. 05/12/19   [provider]  hydrALAZINE (APRESOLINE) 25 MG tablet Take 25 mg by mouth 2 (two) times daily.    [provider]  loratadine (CLARITIN) 10 MG tablet Take 10 mg by mouth daily as needed. Reported on 09/11/2015    [provider]  vitamin B-12 (CYANOCOBALAMIN) 500 MCG tablet Take 500 mcg by mouth daily.    [provider]    Allergies Alendronate, Amlodipine besylate, Ciprofloxacin, Codeine, Lovastatin, and Septra [sulfamethoxazole-trimethoprim]  Family History  Problem Relation Age of Onset   Arthritis Mother    Hypertension Mother    Osteoporosis Mother    COPD Father    Arthritis Maternal Grandmother    Heart disease Maternal Grandfather    Colon cancer Neg Hx    Breast cancer Neg Hx     Social History Social History   Tobacco Use   Smoking status: Former    Packs/day: 1.00    Years: 20.00    Pack years: 20.00    Types: Cigarettes    Quit date: 08/11/1973    Years since quitting: 47.7   Smokeless tobacco: Never  Substance Use Topics   Alcohol use: Yes    Alcohol/week: 2.0 standard drinks    Types: 2 Glasses of wine per week   Drug use: No    Review of Systems Constitutional: No fever/chills and does not feel weak or dehydrated Eyes: No visual changes. ENT: No sore throat. Cardiovascular: Denies chest pain. Respiratory: Denies shortness of breath. Gastrointestinal: See HPI Genitourinary: Negative for dysuria. Musculoskeletal: Negative for back pain. Neurological: Negative for headaches or weakness.    ____________________________________________   PHYSICAL EXAM:  VITAL SIGNS: ED Triage Vitals  Enc Vitals Group     BP 04/16/21 1807 (!) 143/91     Pulse Rate 04/16/21 1807 82     Resp 04/16/21 1807 16     Temp 04/16/21 1807 97.7 F (36.5 C)     Temp Source 04/16/21 1807 Oral     SpO2 04/16/21 1807 92 %     Weight 04/16/21 1807 140 lb (63.5 kg)     Height 04/16/21 1807 5'  6" (1.676 m)     Head Circumference --      Peak Flow --      Pain Score 04/16/21 1807 2     Pain Loc --      Pain Edu? --      Excl. in [place]? --     Constitutional: Alert and oriented. Well appearing and in no acute distress.  Both she and her best friend is with her both very pleasant conversant in no distress Eyes: Conjunctivae are normal. Head: Atraumatic. Nose: No congestion/rhinnorhea. Mouth/Throat: Mucous membranes are moist. Neck: No stridor.  Cardiovascular: Normal rate, regular rhythm. Grossly normal heart sounds.  Good peripheral circulation. Respiratory: Normal respiratory effort.  No retractions. Lungs CTAB. Gastrointestinal: Soft and nontender  except he does report some discomfort to palpation primarily in the left lower quadrant.  There is no rebound or guarding there is no right upper quadrant pain no pain McBurney's point.  No peritonitis, but does have mild reproducible focal discomfort left lower abdomen just above the pelvic brim. No distention. Musculoskeletal: No lower extremity tenderness nor edema. Neurologic:  Normal speech and language. No gross focal neurologic deficits are appreciated.  Skin:  Skin is warm, dry and intact. No rash noted. Psychiatric: Mood and affect are normal. Speech and behavior are normal.  ____________________________________________   LABS (all labs ordered are listed, but only abnormal results are displayed)  Labs Reviewed  COMPREHENSIVE METABOLIC PANEL - Abnormal; Notable for the following components:      Result Value   Glucose, Bld 111 (*)    BUN 33 (*)    Creatinine, Ser 1.29 (*)    AST 14 (*)    GFR, Estimated 41 (*)    All other components within normal limits  CBC - Abnormal; Notable for the following components:   WBC 16.7 (*)    Platelets 457 (*)    All other components within normal limits  URINALYSIS, COMPLETE (UACMP) WITH MICROSCOPIC - Abnormal; Notable for the following components:   Protein, ur 100 (*)    All  other components within normal limits  RESP PANEL BY RT-PCR (FLU A&B, COVID) ARPGX2  LIPASE, BLOOD  POC OCCULT BLOOD, ED  ____________________________________________  RADIOLOGY  CT ABDOMEN PELVIS W CONTRAST  Result Date: 04/16/2021 CLINICAL DATA:  Rectal bleeding.  Diverticulitis suspected EXAM: CT ABDOMEN AND PELVIS WITH CONTRAST TECHNIQUE: Multidetector CT imaging of the abdomen and pelvis was performed using the standard protocol following bolus administration of intravenous contrast. CONTRAST:  49m OMNIPAQUE IOHEXOL 350 MG/ML SOLN COMPARISON:  None. FINDINGS: Lower chest: Cardiomegaly.  No acute abnormality. Hepatobiliary: 9 mm gallstone within the gallbladder. No focal hepatic abnormality. No biliary ductal dilatation. Pancreas: No focal abnormality or ductal dilatation. Spleen: No focal abnormality.  Normal size. Adrenals/Urinary Tract: Areas of scarring in the upper and midpole of the left kidney. No hydronephrosis or stones. Adrenal glands and urinary bladder unremarkable. Large stool burden throughout the colon. Stomach/Bowel: There is mild circumferential wall thickening within the descending colon compatible with colitis. Moderate stool burden throughout the colon. Stomach and small bowel decompressed. Vascular/Lymphatic: Aortoiliac atherosclerosis. No evidence of aneurysm or adenopathy. Reproductive: No visible focal abnormality. Other: No free fluid or free air. Musculoskeletal: No acute bony abnormality. IMPRESSION: Circumferential wall thickening within the mid to distal descending colon compatible with colitis. Moderate stool burden throughout the colon. Cardiomegaly, aortic atherosclerosis. Cholelithiasis. Electronically Signed   By: KRolm BaptiseM.D.   On: 04/16/2021 22:46     Imaging studies reviewed.  Most apparent evidence of colitis mid to distal descending colon. ____________________________________________   PROCEDURES  Procedure(s) performed:  None  Procedures  Critical Care performed: No  ____________________________________________   INITIAL IMPRESSION / ASSESSMENT AND PLAN / ED COURSE  Pertinent labs & imaging results that were available during my care of the patient were reviewed by me and considered in my medical decision making (see chart for details).   Clinically very reassuring.  Patient appears well and nontoxic but does have focal discomfort in the left lower abdomen reports having noticed some blood in her formed stool today.  She is also had preceding couple of days of crampy abdominal discomfort associated with loose stool on Friday.  Differential diagnosis includes but is not limited  to, abdominal perforation, aortic dissection, cholecystitis, appendicitis, diverticulitis, colitis, esophagitis/gastritis, kidney stone, pyelonephritis, urinary tract infection, aortic aneurysm. All are considered in decision and treatment plan. Based upon the patient's presentation and risk factors, we will proceed with further evaluation including imaging of the abdomen pelvis especially focused on the left lower quadrant.  ----------------------------------------- 11:47 PM on 04/16/2021 ----------------------------------------- The patient is resting comfortably at this time.  No pain or nausea.  Discussed with patient and also in review of her symptoms and imaging will place on 3-day course of azithromycin for treatment of presumed infectious colitis.  At this point do recommend follow-up with her primary care Dr. Doy Hutching and we discussed very careful return precautions especially around abdominal pain further bleeding or worsening gastrointestinal distress or symptoms.  Patient very amenable, friend is here who is going to be driving her home.  I do not see clear evidence to suggest an full amatory etiology for her colitis or ischemic nature.   She appears awake alert well oriented and stable for discharge.  She is agreeable with this  plan.       ____________________________________________   FINAL CLINICAL IMPRESSION(S) / ED DIAGNOSES  Final diagnoses:  Colitis presumed infectious        Note:  This document was prepared using Dragon voice recognition software and may include unintentional dictation errors       Delman Kitten, MD 04/16/21 2349

## 2021-04-16 NOTE — Discharge Instructions (Addendum)
You were seen in the emergency room for abdominal pain. It is important that you follow up closely with your primary care doctor in the next couple of days. ° ° °Please return to the emergency room right away if you are to develop a fever, severe nausea, your pain becomes severe or worsens, you are unable to keep food down, begin vomiting any dark or bloody fluid, you develop any dark or bloody stools, feel dehydrated, or other new concerns or symptoms arise. ° ° °

## 2021-04-16 NOTE — ED Provider Notes (Signed)
Return precautions and treatment recommendations and follow-up discussed with the patient who is agreeable with the plan.    Delman Kitten, MD 04/16/21 [phone]

## 2021-05-28 IMAGING — US US RENAL
1 series · 14 of 25 positions shown · non-contrast
Comparison: None.

CLINICAL DATA: 83-year-old female with acute renal injury on stage
III chronic renal disease.

EXAM:
RENAL / URINARY TRACT ULTRASOUND COMPLETE

[Series 1: us renal · 14 of 38 slices shown]
[im 1/38]
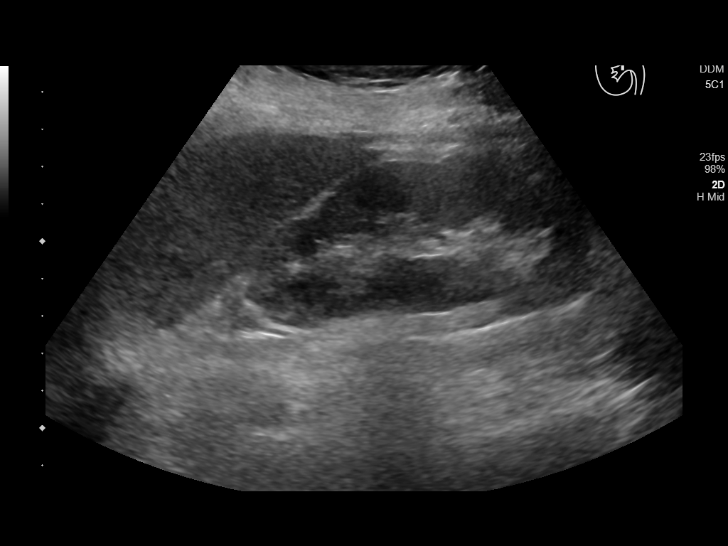
[im 4/38]
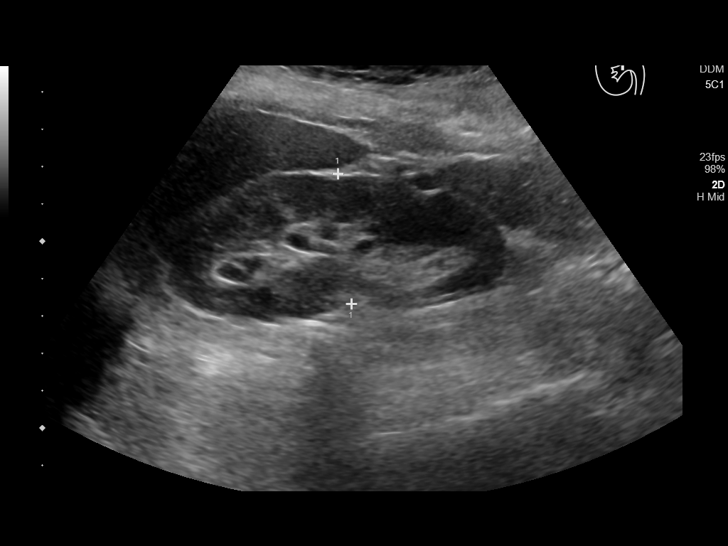
[im 7/38]
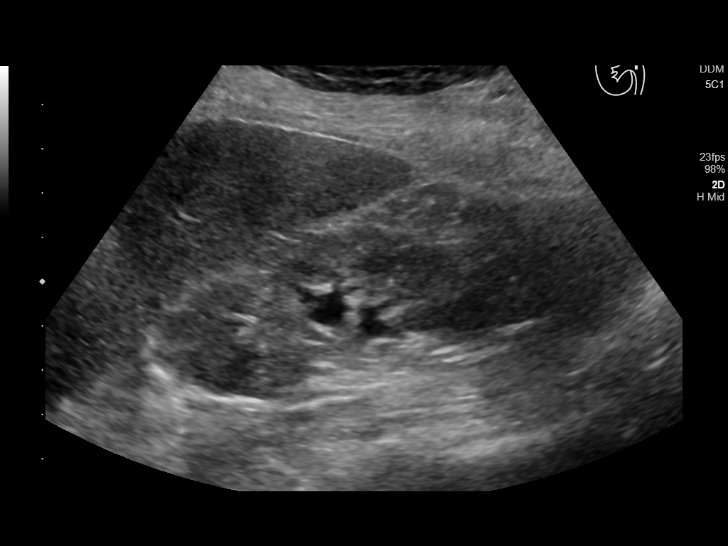
[im 10/38]
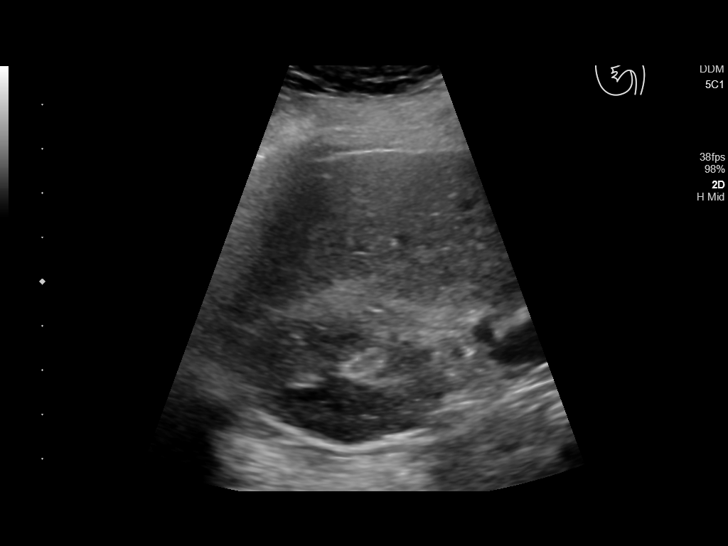
[im 13/38]
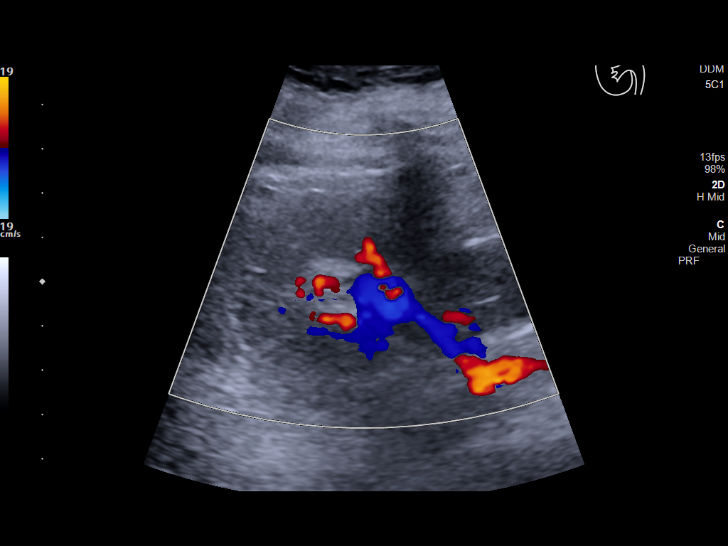
[im 14/38]
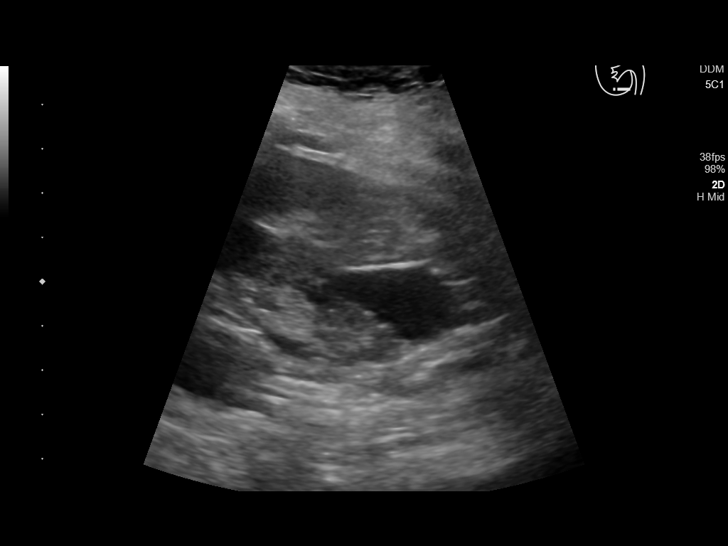
[im 17/38]
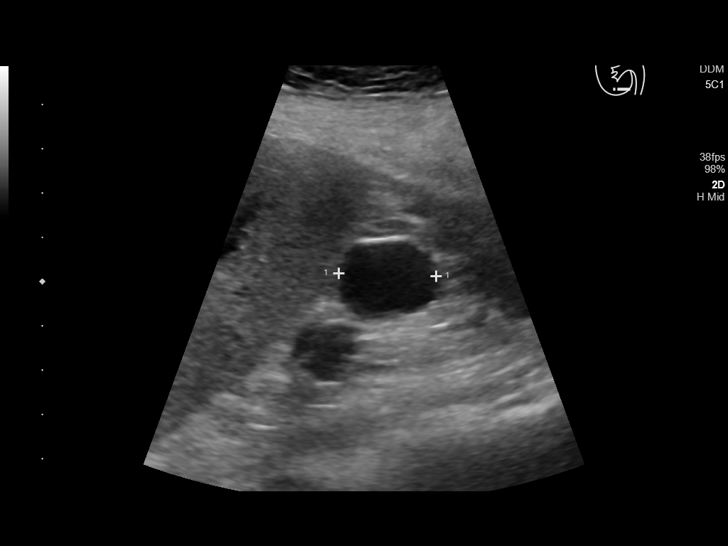
[im 21/38]
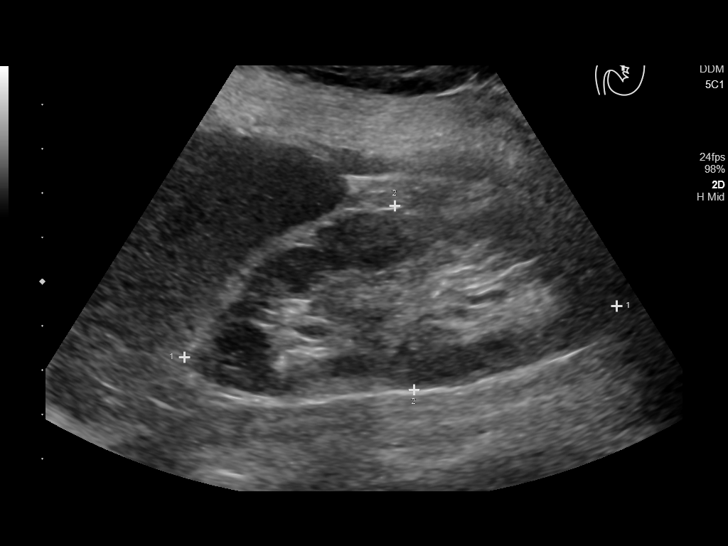
[im 24/38]
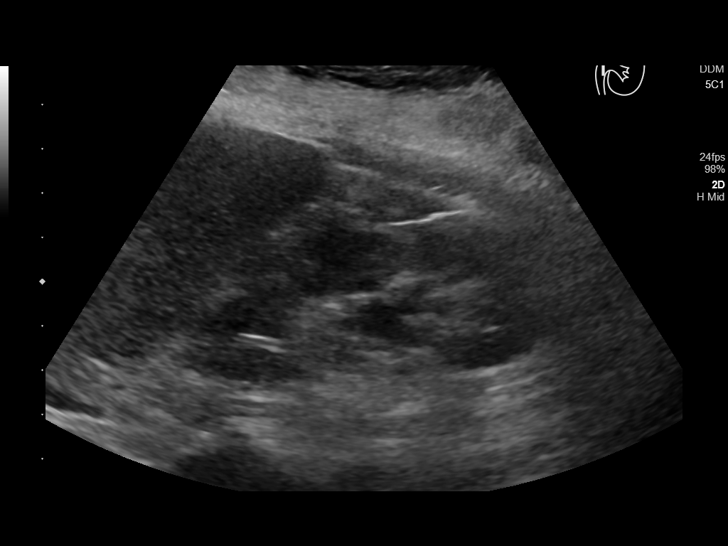
[im 25/38]
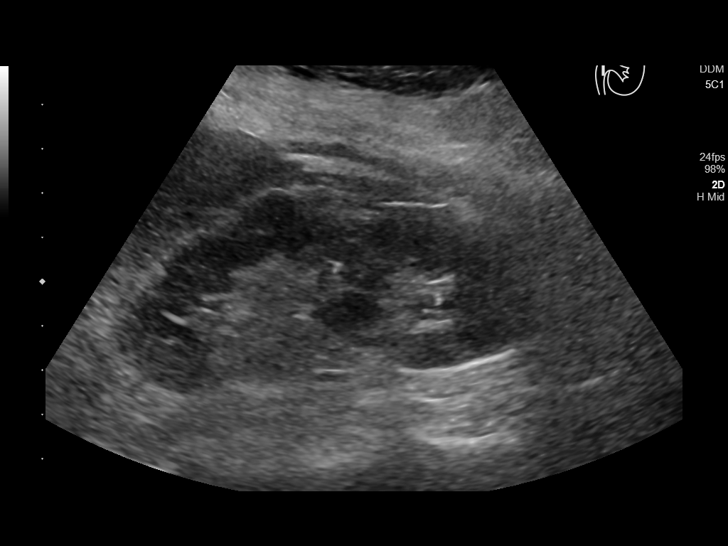
[im 28/38]
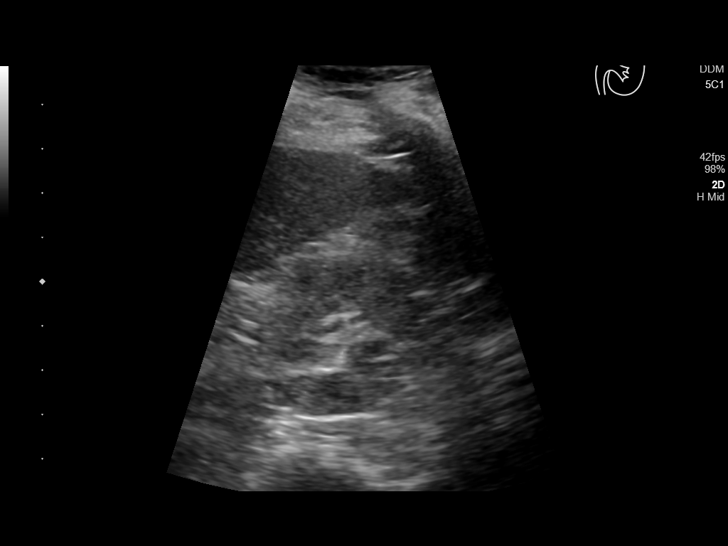
[im 31/38]
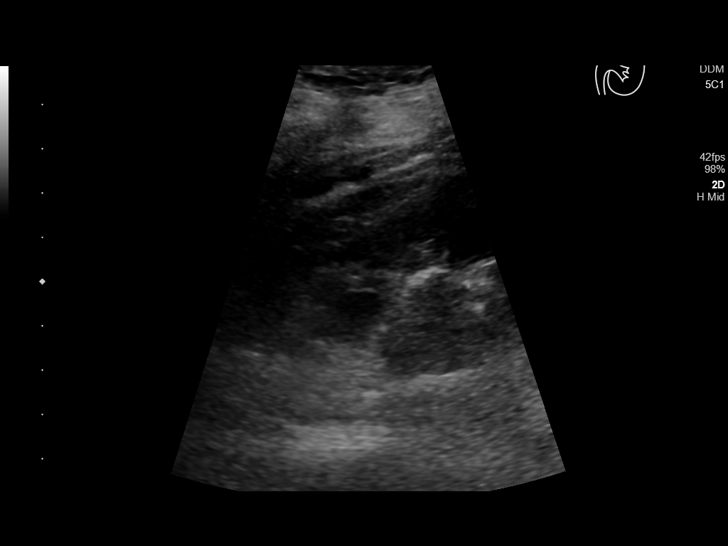
[im 34/38]
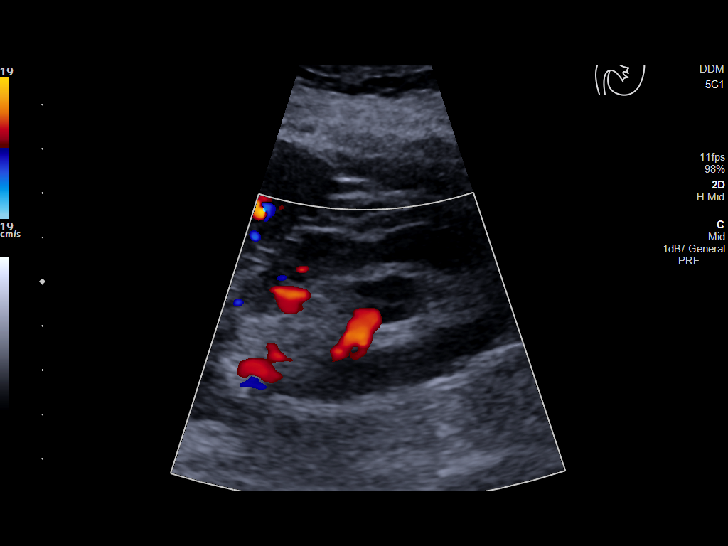
[im 38/38]
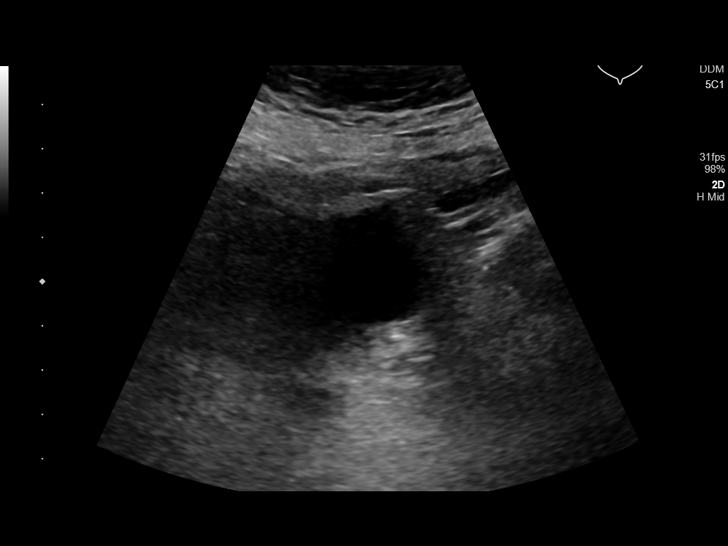

[14 of 25 positions shown; findings below may reference images not displayed]

FINDINGS: Right Kidney:

Renal measurements: 9.3 x 3.1 x 5 cm = volume: 85 mL. A 3.1 cm cyst
is present. Echogenicity within normal limits. No solid mass or
hydronephrosis visualized.

Left Kidney:

Renal measurements: 9.8 x 4.2 x 4 cm = volume: 86 mL. A 1.5 cm cyst
is present. Echogenicity within normal limits. No solid mass or
hydronephrosis visualized.

Bladder:

Appears normal for degree of bladder distention.

Other:

None
IMPRESSION: 1. Small kidneys. Echogenicity within normal limits and no evidence
of hydronephrosis.

## 2021-05-29 ENCOUNTER — Other Ambulatory Visit: Payer: Self-pay | Admitting: Gastroenterology

## 2021-05-29 DIAGNOSIS — R1032 Left lower quadrant pain: Secondary | ICD-10-CM

## 2021-05-29 DIAGNOSIS — R1011 Right upper quadrant pain: Secondary | ICD-10-CM

## 2021-05-29 DIAGNOSIS — K5909 Other constipation: Secondary | ICD-10-CM

## 2021-05-29 DIAGNOSIS — K529 Noninfective gastroenteritis and colitis, unspecified: Secondary | ICD-10-CM

## 2021-06-21 ENCOUNTER — Ambulatory Visit: Payer: Medicare Other

## 2021-07-09 ENCOUNTER — Other Ambulatory Visit: Payer: Self-pay | Admitting: *Deleted

## 2021-07-09 ENCOUNTER — Other Ambulatory Visit: Payer: Self-pay

## 2021-07-09 ENCOUNTER — Ambulatory Visit
Admission: RE | Admit: 2021-07-09 | Discharge: 2021-07-09 | Disposition: A | Discharge status: Home / Self Care | Payer: Medicare Other | Source: Ambulatory Visit | Attending: Gastroenterology | Admitting: Gastroenterology

## 2021-07-09 DIAGNOSIS — K5909 Other constipation: Secondary | ICD-10-CM

## 2021-07-09 DIAGNOSIS — R1032 Left lower quadrant pain: Secondary | ICD-10-CM | POA: Insufficient documentation

## 2021-07-09 DIAGNOSIS — R803 Bence Jones proteinuria: Secondary | ICD-10-CM

## 2021-07-09 DIAGNOSIS — K529 Noninfective gastroenteritis and colitis, unspecified: Secondary | ICD-10-CM | POA: Diagnosis present

## 2021-07-09 DIAGNOSIS — R1011 Right upper quadrant pain: Secondary | ICD-10-CM | POA: Insufficient documentation

## 2021-07-10 ENCOUNTER — Inpatient Hospital Stay: Payer: Medicare Other | Attending: Oncology

## 2021-07-10 DIAGNOSIS — R803 Bence Jones proteinuria: Secondary | ICD-10-CM | POA: Insufficient documentation

## 2021-07-10 LAB — CBC WITH DIFFERENTIAL/PLATELET
Abs Immature Granulocytes: 0.15 10*3/uL — ABNORMAL HIGH (ref 0.00–0.07)
Basophils Absolute: 0.1 10*3/uL (ref 0.0–0.1)
Basophils Relative: 1 %
Eosinophils Absolute: 0.4 10*3/uL (ref 0.0–0.5)
Eosinophils Relative: 3 %
HCT: 40.3 % (ref 36.0–46.0)
Hemoglobin: 12.9 g/dL (ref 12.0–15.0)
Immature Granulocytes: 1 %
Lymphocytes Relative: 16 %
Lymphs Abs: 2.4 10*3/uL (ref 0.7–4.0)
MCH: 32 pg (ref 26.0–34.0)
MCHC: 32 g/dL (ref 30.0–36.0)
MCV: 100 fL (ref 80.0–100.0)
Monocytes Absolute: 0.9 10*3/uL (ref 0.1–1.0)
Monocytes Relative: 6 %
Neutro Abs: 11.4 10*3/uL — ABNORMAL HIGH (ref 1.7–7.7)
Neutrophils Relative %: 73 %
Platelets: 432 10*3/uL — ABNORMAL HIGH (ref 150–400)
RBC: 4.03 MIL/uL (ref 3.87–5.11)
RDW: 16.9 % — ABNORMAL HIGH (ref 11.5–15.5)
WBC: 15.2 10*3/uL — ABNORMAL HIGH (ref 4.0–10.5)
nRBC: 0 % (ref 0.0–0.2)

## 2021-07-10 LAB — COMPREHENSIVE METABOLIC PANEL
ALT: 12 U/L (ref 0–44)
AST: 15 U/L (ref 15–41)
Albumin: 3.6 g/dL (ref 3.5–5.0)
Alkaline Phosphatase: 53 U/L (ref 38–126)
Anion gap: 9 (ref 5–15)
BUN: 32 mg/dL — ABNORMAL HIGH (ref 8–23)
CO2: 26 mmol/L (ref 22–32)
Calcium: 9.1 mg/dL (ref 8.9–10.3)
Chloride: 105 mmol/L (ref 98–111)
Creatinine, Ser: 1.12 mg/dL — ABNORMAL HIGH (ref 0.44–1.00)
GFR, Estimated: 49 mL/min — ABNORMAL LOW (ref >60)
Glucose, Bld: 103 mg/dL — ABNORMAL HIGH (ref 70–99)
Potassium: 4.3 mmol/L (ref 3.5–5.1)
Sodium: 140 mmol/L (ref 135–145)
Total Bilirubin: 0.5 mg/dL (ref 0.3–1.2)
Total Protein: 6.1 g/dL — ABNORMAL LOW (ref 6.5–8.1)

## 2021-07-11 LAB — IGG, IGA, IGM
IgA: 117 mg/dL (ref 64–422)
IgG (Immunoglobin G), Serum: 693 mg/dL (ref 586–1602)
IgM (Immunoglobulin M), Srm: 12 mg/dL — ABNORMAL LOW (ref 26–217)

## 2021-07-12 LAB — PROTEIN ELECTRO, RANDOM URINE
Albumin ELP, Urine: 14.5 %
Alpha-1-Globulin, U: 3.1 %
Alpha-2-Globulin, U: 8.8 %
Beta Globulin, U: 67.4 %
Gamma Globulin, U: 6.2 %
M Component, Ur: 50.4 % — ABNORMAL HIGH
Total Protein, Urine: 87.2 mg/dL

## 2021-07-12 NOTE — Progress Notes (Signed)
[place]  Telephone:([phone] [phone]  HEMATOLOGY-ONCOLOGY TELEMEDICINE VISIT PROGRESS NOTE  ID: Marilyn Chancellor Brandenburger OB: 12-18-1935  MR#: [id number]  HKV#:[id number]  Patient Care Team: Idelle Crouch, MD as PCP - General (Internal Medicine)  I connected with Marilyn Sharp on 07/12/21 at 10:45 AM EST by telephone visit and verified that I am speaking with the correct person using two identifiers.   I discussed the limitations, risks, security and privacy concerns of performing an evaluation and management service by telemedicine and the availability of in-person appointments. I also discussed with the patient that there may be a patient responsible charge related to this service. The patient expressed understanding and agreed to proceed.   Other persons participating in the visit and their role in the encounter: Patient, MD.  Patient's location: Home. Provider's location: Clinic.  CHIEF COMPLAINT: Smoldering kappa chain myeloma, Bence-Jones proteinuria.  INTERVAL HISTORY: Patient initially was scheduled a video assisted telemedicine visit, but due to technical difficulties the entire visit was completed by telephone.  She continues to feel well and remains asymptomatic.  She has no neurologic complaints.  She denies any recent fevers or illnesses.  She has a good appetite and denies weight loss.  She has no chest pain, shortness of breath, cough, or hemoptysis.  She has no nausea, vomiting, constipation, or diarrhea.  She has no urinary complaints.  Patient offers no specific complaints today.  REVIEW OF SYSTEMS:   Review of Systems  Constitutional: Negative.  Negative for fever, malaise/fatigue and weight loss.  Respiratory: Negative.  Negative for cough, hemoptysis and shortness of breath.   Cardiovascular: Negative.  Negative for chest pain and leg swelling.  Gastrointestinal: Negative.  Negative for abdominal pain.  Genitourinary: Negative.   Negative for dysuria and hematuria.  Musculoskeletal: Negative.  Negative for back pain.  Skin: Negative.  Negative for rash.  Neurological: Negative.  Negative for dizziness, focal weakness, weakness and headaches.  Psychiatric/Behavioral: Negative.  The patient is not nervous/anxious.    As per HPI. Otherwise, a complete review of systems is negative.  PAST MEDICAL HISTORY: Past Medical History:  Diagnosis Date   Allergy    Anxiety    Arthritis    Chicken pox    Depression    Family history of polyps in the colon    GERD (gastroesophageal reflux disease)    Headache    Heart murmur    Heart palpitations    Hyperlipidemia    Hypertension    Kidney stone    Shingles 2012   Stress fracture of lumbar vertebra     PAST SURGICAL HISTORY: Past Surgical History:  Procedure Laterality Date   bilateral blephoplasty  2001   BREAST CYST ASPIRATION  2001   BREAST SURGERY Left    benign cyst   CATARACT EXTRACTION, BILATERAL  2007   EYE SURGERY     KYPHOPLASTY N/A 09/11/2015   Procedure: KYPHOPLASTY T12;  Surgeon: Hessie Knows, MD;  Location: [facility] ORS;  Service: Orthopedics;  Laterality: N/A;   RHINOPLASTY      FAMILY HISTORY: Family History  Problem Relation Age of Onset   Arthritis Mother    Hypertension Mother    Osteoporosis Mother    COPD Father    Arthritis Maternal Grandmother    Heart disease Maternal Grandfather    Colon cancer Neg Hx    Breast cancer Neg Hx     ADVANCED DIRECTIVES (Y/N):  N  HEALTH MAINTENANCE: Social History   Tobacco Use  Smoking status: Former    Packs/day: 1.00    Years: 20.00    Pack years: 20.00    Types: Cigarettes    Quit date: 08/11/1973    Years since quitting: 47.9   Smokeless tobacco: Never  Substance Use Topics   Alcohol use: Yes    Alcohol/week: 2.0 standard drinks    Types: 2 Glasses of wine per week   Drug use: No     Colonoscopy:  PAP:  Bone density:  Lipid panel:  Allergies  Allergen Reactions    Alendronate     Other reaction(s): Other (See Comments) Chest pain   Amlodipine Besylate Other (See Comments)    "dizzy"   Ciprofloxacin Hives   Codeine Itching   Lovastatin     Other reaction(s): Muscle Pain   Septra [Sulfamethoxazole-Trimethoprim] Itching    Current Outpatient Medications  Medication Sig Dispense Refill   acetaminophen (TYLENOL) 325 MG tablet Take 650 mg by mouth every 6 (six) hours as needed.     benazepril (LOTENSIN) 40 MG tablet Take by mouth.     Cholecalciferol (VITAMIN D3) 50 MCG (2000 UT) capsule Take by mouth.     diltiazem (TIAZAC) 180 MG 24 hr capsule Take 180 mg by mouth daily.     hydrALAZINE (APRESOLINE) 25 MG tablet Take 25 mg by mouth 2 (two) times daily.     loratadine (CLARITIN) 10 MG tablet Take 10 mg by mouth daily as needed. Reported on 09/11/2015     vitamin B-12 (CYANOCOBALAMIN) 500 MCG tablet Take 500 mcg by mouth daily.     No current facility-administered medications for this visit.    OBJECTIVE: There were no vitals filed for this visit.   There is no height or weight on file to calculate BMI.    ECOG FS:0 - Asymptomatic   LAB RESULTS:  Lab Results  Component Value Date   NA 140 07/10/2021   K 4.3 07/10/2021   CL 105 07/10/2021   CO2 26 07/10/2021   GLUCOSE 103 (H) 07/10/2021   BUN 32 (H) 07/10/2021   CREATININE 1.12 (H) 07/10/2021   CALCIUM 9.1 07/10/2021   PROT 6.1 (L) 07/10/2021   ALBUMIN 3.6 07/10/2021   AST 15 07/10/2021   ALT 12 07/10/2021   ALKPHOS 53 07/10/2021   BILITOT 0.5 07/10/2021   GFRNONAA 49 (L) 07/10/2021   GFRAA 39 (L) 01/05/2020    Lab Results  Component Value Date   WBC 15.2 (H) 07/10/2021   NEUTROABS 11.4 (H) 07/10/2021   HGB 12.9 07/10/2021   HCT 40.3 07/10/2021   MCV 100.0 07/10/2021   PLT 432 (H) 07/10/2021     STUDIES: CT ABDOMEN PELVIS WO CONTRAST  Result Date: 07/09/2021 CLINICAL DATA:  Right upper and left lower quadrant pain 1 month ago, but has subsided. Constipation. EXAM: CT  ABDOMEN AND PELVIS WITHOUT CONTRAST TECHNIQUE: Multidetector CT imaging of the abdomen and pelvis was performed following the standard protocol without IV contrast. COMPARISON:  CT abdomen-pelvis 04/16/2021; U/S renal 06/03/2019. FINDINGS: Lower chest: Trace bilateral pleural effusions identified. Hepatobiliary: No focal liver abnormality. There is a 9 mm gallstone identified. No signs of gallbladder wall inflammation or bile duct dilatation. Pancreas: Unremarkable. No pancreatic ductal dilatation or surrounding inflammatory changes. Spleen: Normal in size without focal abnormality. Adrenals/Urinary Tract: Normal adrenal glands. No kidney stone, mass or hydronephrosis. Cyst arises off the anterior cortex of the upper pole of right kidney measuring 2.4 cm. Urinary bladder is unremarkable. Stomach/Bowel: Stomach appears normal. The appendix is  visualized and is within normal limits. Moderate stool burden identified within the colon. No bowel wall thickening, inflammation, or distension. Vascular/Lymphatic: Aortic atherosclerosis. No aneurysm. No abdominopelvic adenopathy. Reproductive: Uterus and bilateral adnexa are unremarkable. Other: No free fluid or fluid collections. Mild diffuse body wall edema identified compatible with anasarca. Musculoskeletal: Bones appear osteopenic. Treated compression deformity is identified at the T12 level. No acute or suspicious osseous findings. IMPRESSION: 1. No acute findings identified within the abdomen or pelvis. 2. Moderate stool burden identified within the colon. Correlate for any clinical signs or symptoms of constipation. 3. Gallstone. 4. Trace bilateral pleural effusions and anasarca. 5. Aortic atherosclerosis (ICD10-I70.0). Electronically Signed   By: Kerby Moors M.D.   On: 07/09/2021 16:34    ASSESSMENT: Smoldering kappa chain myeloma, Bence-Jones proteinuria  PLAN:    1.  Smoldering kappa chain myeloma, Bence-Jones proteinuria: Bone marrow biopsy on July 04, 2019 revealed a hypercellular marrow with approximately 25% kappa restricted plasmacytosis.  Cytogenetics were reported as normal.  Previously, 24-hour UPEP revealed only 0.182 g of monoclonal protein.  She does not have an M spike in her serum and her immunoglobulins are within normal limits.  (For patient to be considered a smoldering myeloma she would have to have greater than 0.5 g of monoclonal protein in her urine over 24-hours or 10 to 60% plasma cells in her bone marrow without endorgan damage).  Kappa free light chains were 660.2 in May 2020.  They have trended down slightly to 510.1.  Her M spike in urine has declined slightly from 61% to 50%.  Urine testing was insufficient quantity, but previously the M spike in her urine is unchanged at 61.6%.  Her renal insufficiency continues to improve and she has no other evidence of endorgan damage.  No intervention is needed at this time.  Return to clinic in 6 months for laboratory work and routine evaluation. 2.  Chronic renal insufficiency: Creatinine improved to 1.12.  Continue follow-up with nephrology at as scheduled.  I provided 12 minutes of non face-to-face telephone visit time during this encounter which included chart review, counseling, and coordination of care as documented above.     Lloyd Huger, MD 07/12/2021 3:15 PM

## 2021-07-15 LAB — MULTIPLE MYELOMA PANEL, SERUM
Albumin SerPl Elph-Mcnc: 3.3 g/dL (ref 2.9–4.4)
Albumin/Glob SerPl: 1.7 (ref 0.7–1.7)
Alpha 1: 0.2 g/dL (ref 0.0–0.4)
Alpha2 Glob SerPl Elph-Mcnc: 0.6 g/dL (ref 0.4–1.0)
B-Globulin SerPl Elph-Mcnc: 0.7 g/dL (ref 0.7–1.3)
Gamma Glob SerPl Elph-Mcnc: 0.6 g/dL (ref 0.4–1.8)
Globulin, Total: 2 g/dL — ABNORMAL LOW (ref 2.2–3.9)
IgA: 112 mg/dL (ref 64–422)
IgG (Immunoglobin G), Serum: 703 mg/dL (ref 586–1602)
IgM (Immunoglobulin M), Srm: 11 mg/dL — ABNORMAL LOW (ref 26–217)
Total Protein ELP: 5.3 g/dL — ABNORMAL LOW (ref 6.0–8.5)

## 2021-07-17 ENCOUNTER — Other Ambulatory Visit: Payer: Self-pay

## 2021-07-17 ENCOUNTER — Inpatient Hospital Stay: Payer: Medicare Other | Attending: Oncology | Admitting: Oncology

## 2021-07-17 DIAGNOSIS — R803 Bence Jones proteinuria: Secondary | ICD-10-CM | POA: Diagnosis not present

## 2021-07-17 NOTE — Progress Notes (Signed)
Marilyn Sharp reports difficulty with mychart and is unsure of how to do virtual visit. Instructed Marilyn Sharp on process. Marilyn Sharp requests telephone call to home phone if difficulties result. Marilyn Sharp would like to know lab results and why she is seeing Dr. Grayland Ormond.

## 2021-10-14 ENCOUNTER — Other Ambulatory Visit: Payer: Self-pay | Admitting: Internal Medicine

## 2021-10-14 DIAGNOSIS — I272 Pulmonary hypertension, unspecified: Secondary | ICD-10-CM

## 2021-10-14 DIAGNOSIS — D472 Monoclonal gammopathy: Secondary | ICD-10-CM

## 2021-10-26 ENCOUNTER — Other Ambulatory Visit: Payer: Self-pay

## 2021-10-26 ENCOUNTER — Emergency Department
Admission: EM | Admit: 2021-10-26 | Discharge: 2021-10-26 | Disposition: A | Discharge status: Home / Self Care | Payer: Medicare Other | Attending: Emergency Medicine | Admitting: Emergency Medicine

## 2021-10-26 ENCOUNTER — Emergency Department: Payer: Medicare Other

## 2021-10-26 DIAGNOSIS — K529 Noninfective gastroenteritis and colitis, unspecified: Secondary | ICD-10-CM | POA: Insufficient documentation

## 2021-10-26 DIAGNOSIS — D72829 Elevated white blood cell count, unspecified: Secondary | ICD-10-CM | POA: Insufficient documentation

## 2021-10-26 DIAGNOSIS — I10 Essential (primary) hypertension: Secondary | ICD-10-CM | POA: Insufficient documentation

## 2021-10-26 DIAGNOSIS — R1032 Left lower quadrant pain: Secondary | ICD-10-CM | POA: Diagnosis present

## 2021-10-26 LAB — CBC
HCT: 44.9 % (ref 36.0–46.0)
Hemoglobin: 14.3 g/dL (ref 12.0–15.0)
MCH: 28.6 pg (ref 26.0–34.0)
MCHC: 31.8 g/dL (ref 30.0–36.0)
MCV: 89.8 fL (ref 80.0–100.0)
Platelets: 482 10*3/uL — ABNORMAL HIGH (ref 150–400)
RBC: 5 MIL/uL (ref 3.87–5.11)
RDW: 14.6 % (ref 11.5–15.5)
WBC: 18.2 10*3/uL — ABNORMAL HIGH (ref 4.0–10.5)
nRBC: 0 % (ref 0.0–0.2)

## 2021-10-26 LAB — URINALYSIS, ROUTINE W REFLEX MICROSCOPIC
Bilirubin Urine: NEGATIVE
Glucose, UA: NEGATIVE mg/dL
Hgb urine dipstick: NEGATIVE
Ketones, ur: NEGATIVE mg/dL
Leukocytes,Ua: NEGATIVE
Nitrite: NEGATIVE
Protein, ur: NEGATIVE mg/dL
Specific Gravity, Urine: 1.009 (ref 1.005–1.030)
pH: 6 (ref 5.0–8.0)

## 2021-10-26 LAB — COMPREHENSIVE METABOLIC PANEL
ALT: 12 U/L (ref 0–44)
AST: 16 U/L (ref 15–41)
Albumin: 3.6 g/dL (ref 3.5–5.0)
Alkaline Phosphatase: 63 U/L (ref 38–126)
Anion gap: 6 (ref 5–15)
BUN: 29 mg/dL — ABNORMAL HIGH (ref 8–23)
CO2: 31 mmol/L (ref 22–32)
Calcium: 9.2 mg/dL (ref 8.9–10.3)
Chloride: 106 mmol/L (ref 98–111)
Creatinine, Ser: 1.24 mg/dL — ABNORMAL HIGH (ref 0.44–1.00)
GFR, Estimated: 43 mL/min — ABNORMAL LOW (ref >60)
Glucose, Bld: 103 mg/dL — ABNORMAL HIGH (ref 70–99)
Potassium: 3.5 mmol/L (ref 3.5–5.1)
Sodium: 143 mmol/L (ref 135–145)
Total Bilirubin: 0.9 mg/dL (ref 0.3–1.2)
Total Protein: 6.4 g/dL — ABNORMAL LOW (ref 6.5–8.1)

## 2021-10-26 LAB — LIPASE, BLOOD: Lipase: 33 U/L (ref 11–51)

## 2021-10-26 MED ORDER — ONDANSETRON HCL 4 MG/2ML IJ SOLN
4.0000 mg | Freq: Once | INTRAMUSCULAR | Status: DC
  Filled 2021-10-26: qty 2

## 2021-10-26 MED ORDER — AMOXICILLIN-POT CLAVULANATE 875-125 MG PO TABS: 1.0000 | ORAL_TABLET | Freq: Two times a day (BID) | ORAL | 0 refills | Status: DC

## 2021-10-26 MED ORDER — MORPHINE SULFATE (PF) 4 MG/ML IV SOLN
4.0000 mg | Freq: Once | INTRAVENOUS | Status: DC
Start: 2021-10-26 — End: 2021-10-26
  Filled 2021-10-26: qty 1

## 2021-10-26 MED ORDER — TRAMADOL HCL 50 MG PO TABS
50.0000 mg | ORAL_TABLET | Freq: Four times a day (QID) | ORAL | 0 refills | Status: DC | PRN
Start: 2021-10-26 — End: 2021-11-14

## 2021-10-26 MED ORDER — IOHEXOL 300 MG/ML  SOLN
70.0000 mL | Freq: Once | INTRAMUSCULAR | Status: AC | PRN
  Administered 2021-10-26: 70 mL via INTRAVENOUS

## 2021-10-26 NOTE — ED Notes (Signed)
Pt educated on need for urine sample when able to provide one. ?

## 2021-10-26 NOTE — ED Provider Notes (Signed)
? ?[facility] ?Provider Note ? ? ? Event Date/Time  ? First MD Initiated Contact with Patient 10/26/21 1034   ?  (approximate) ? ? ?History  ? ?Abdominal Pain ? ? ?HPI ? ?Marilyn Sharp is a 86 y.o. female with a history of high blood pressure, colitis who presents with complaints of left lower quadrant abdominal pain.  Patient reports pain is developed over the last 24 hours.  She describes the pain is primarily in her left lower quadrant.  She reports a history of colitis in September of last year, reviewed records from then, she was treated successfully with antibiotics.  Denies fevers, no vomiting, normal stools although she noticed a bit of blood this morning ?  ? ? ?Physical Exam  ? ?Triage Vital Signs: ?ED Triage Vitals  ?Enc Vitals Group  ?   BP 10/26/21 1024 97/68  ?   Pulse Rate 10/26/21 1024 84  ?   Resp 10/26/21 1024 18  ?   Temp 10/26/21 1024 98.3 ?F (36.8 ?C)  ?   Temp Source 10/26/21 1024 Oral  ?   SpO2 10/26/21 1024 96 %  ?   Weight 10/26/21 1022 61.2 kg (135 lb)  ?   Height 10/26/21 1022 1.651 m ('5\' 5"'$ )  ?   Head Circumference --   ?   Peak Flow --   ?   Pain Score 10/26/21 1022 1  ?   Pain Loc --   ?   Pain Edu? --   ?   Excl. in [place]? --   ? ? ?Most recent vital signs: ?Vitals:  ? 10/26/21 1024 10/26/21 1118  ?BP: 97/68 (!) 154/85  ?Pulse: 84 77  ?Resp: 18 16  ?Temp: 98.3 ?F (36.8 ?C)   ?SpO2: 96% 95%  ? ? ? ?General: Awake, no distress.  ?CV:  Good peripheral perfusion.  ?Resp:  Normal effort.  ?Abd:  No distention.  Tenderness palpation left lower quadrant, no CVA tenderness ?Other:   ? ? ?ED Results / Procedures / Treatments  ? ?Labs ?(all labs ordered are listed, but only abnormal results are displayed) ?Labs Reviewed  ?COMPREHENSIVE METABOLIC PANEL - Abnormal; Notable for the following components:  ?    Result Value  ? Glucose, Bld 103 (*)   ? BUN 29 (*)   ? Creatinine, Ser 1.24 (*)   ? Total Protein 6.4 (*)   ? GFR, Estimated 43 (*)   ? All other components within normal  limits  ?CBC - Abnormal; Notable for the following components:  ? WBC 18.2 (*)   ? Platelets 482 (*)   ? All other components within normal limits  ?URINALYSIS, ROUTINE W REFLEX MICROSCOPIC - Abnormal; Notable for the following components:  ? Color, Urine STRAW (*)   ? APPearance CLEAR (*)   ? All other components within normal limits  ?LIPASE, BLOOD  ? ? ? ?EKG ? ? ? ? ?RADIOLOGY ?CT abdomen pelvis reviewed and interpreted by me, no acute abnormality ? ? ? ?PROCEDURES: ? ?Critical Care performed:  ? ?Procedures ? ? ?MEDICATIONS ORDERED IN ED: ?Medications  ?morphine (PF) 4 MG/ML injection 4 mg (4 mg Intravenous Patient Refused/Not Given 10/26/21 1120)  ?ondansetron (ZOFRAN) injection 4 mg (4 mg Intravenous Patient Refused/Not Given 10/26/21 1121)  ?iohexol (OMNIPAQUE) 300 MG/ML solution 70 mL (70 mLs Intravenous Contrast Given 10/26/21 1226)  ? ? ? ?IMPRESSION / MDM / ASSESSMENT AND PLAN / ED COURSE  ?I reviewed the triage vital signs and  the nursing notes. ? ?Patient presents with abdominal pain as detailed above.  Location is suspicious for diverticulitis, differential includes colitis as well, less likely ureterolithiasis ? ?We will give IV morphine, IV Zofran, IV fluids. ? ?We will obtain labs ? ?We will perform CT abdomen pelvis ? ?Lab work reviewed and notable for elevated white blood cell count of 18,000 ? ?----------------------------------------- ?1:06 PM on 10/26/2021 ?----------------------------------------- ?CT scan demonstrates likely descending colitis, no evidence of perforation.  I discussed this with patient and recommended IV antibiotics and admission however she would really like to try to go home as this is similar to what she had back in September and she did well on home antibiotics.  She agrees to return if any worsening.  Will prescribe Augmentin, analgesics ? ?  ? ? ?FINAL CLINICAL IMPRESSION(S) / ED DIAGNOSES  ? ?Final diagnoses:  ?Colitis  ? ? ? ?Rx / DC Orders  ? ?ED Discharge Orders   ? ?       Ordered  ?  amoxicillin-clavulanate (AUGMENTIN) 875-125 MG tablet  2 times daily       ? 10/26/21 1305  ?  traMADol (ULTRAM) 50 MG tablet  Every 6 hours PRN       ? 10/26/21 1305  ? ?  ?  ? ?  ? ? ? ?Note:  This document was prepared using Dragon voice recognition software and may include unintentional dictation errors. ?  ?Lavonia Drafts, MD ?10/26/21 1307 ? ?

## 2021-10-26 NOTE — ED Notes (Signed)
Pt back from CT. Requesting to drink her Ensure. Informed pt need to wait for CT results before PO intake. ?

## 2021-10-26 NOTE — ED Triage Notes (Signed)
Pt states she feels like she has colitis- pt states she was hospitalized with it on Labor Day but yesterday she started having symptoms again- pt states she has been having abd pain, denies n/v/d ?

## 2021-10-28 ENCOUNTER — Other Ambulatory Visit: Payer: Self-pay

## 2021-10-28 ENCOUNTER — Ambulatory Visit
Admission: RE | Admit: 2021-10-28 | Discharge: 2021-10-28 | Disposition: A | Discharge status: Home / Self Care | Payer: Medicare Other | Source: Ambulatory Visit | Attending: Internal Medicine | Admitting: Internal Medicine

## 2021-10-28 DIAGNOSIS — I272 Pulmonary hypertension, unspecified: Secondary | ICD-10-CM | POA: Insufficient documentation

## 2021-10-28 DIAGNOSIS — D472 Monoclonal gammopathy: Secondary | ICD-10-CM | POA: Insufficient documentation

## 2021-10-28 DIAGNOSIS — K529 Noninfective gastroenteritis and colitis, unspecified: Secondary | ICD-10-CM | POA: Diagnosis not present

## 2021-10-28 DIAGNOSIS — I771 Stricture of artery: Secondary | ICD-10-CM | POA: Diagnosis not present

## 2021-10-28 MED ORDER — IOHEXOL 350 MG/ML SOLN
75.0000 mL | Freq: Once | INTRAVENOUS | Status: AC | PRN
  Administered 2021-10-28: 60 mL via INTRAVENOUS

## 2021-10-30 ENCOUNTER — Emergency Department: Payer: Medicare Other

## 2021-10-30 ENCOUNTER — Other Ambulatory Visit: Payer: Self-pay

## 2021-10-30 ENCOUNTER — Inpatient Hospital Stay
Admission: EM | Admit: 2021-10-30 | Discharge: 2021-11-14 | DRG: 253 | Disposition: A | Discharge status: Skilled Nursing Facility | Payer: Medicare Other | Attending: Internal Medicine | Admitting: Internal Medicine

## 2021-10-30 ENCOUNTER — Encounter: Payer: Self-pay | Admitting: Emergency Medicine

## 2021-10-30 ENCOUNTER — Other Ambulatory Visit: Payer: Medicare Other

## 2021-10-30 DIAGNOSIS — K633 Ulcer of intestine: Secondary | ICD-10-CM | POA: Diagnosis present

## 2021-10-30 DIAGNOSIS — Z87891 Personal history of nicotine dependence: Secondary | ICD-10-CM

## 2021-10-30 DIAGNOSIS — K551 Chronic vascular disorders of intestine: Secondary | ICD-10-CM | POA: Diagnosis present

## 2021-10-30 DIAGNOSIS — R600 Localized edema: Secondary | ICD-10-CM | POA: Diagnosis present

## 2021-10-30 DIAGNOSIS — Y92239 Unspecified place in hospital as the place of occurrence of the external cause: Secondary | ICD-10-CM | POA: Diagnosis not present

## 2021-10-30 DIAGNOSIS — Z9842 Cataract extraction status, left eye: Secondary | ICD-10-CM

## 2021-10-30 DIAGNOSIS — B9620 Unspecified Escherichia coli [E. coli] as the cause of diseases classified elsewhere: Secondary | ICD-10-CM | POA: Diagnosis present

## 2021-10-30 DIAGNOSIS — E860 Dehydration: Secondary | ICD-10-CM | POA: Diagnosis present

## 2021-10-30 DIAGNOSIS — Z8249 Family history of ischemic heart disease and other diseases of the circulatory system: Secondary | ICD-10-CM

## 2021-10-30 DIAGNOSIS — K529 Noninfective gastroenteritis and colitis, unspecified: Principal | ICD-10-CM

## 2021-10-30 DIAGNOSIS — E440 Moderate protein-calorie malnutrition: Secondary | ICD-10-CM | POA: Diagnosis present

## 2021-10-30 DIAGNOSIS — Z8261 Family history of arthritis: Secondary | ICD-10-CM

## 2021-10-30 DIAGNOSIS — Z8262 Family history of osteoporosis: Secondary | ICD-10-CM

## 2021-10-30 DIAGNOSIS — T508X5A Adverse effect of diagnostic agents, initial encounter: Secondary | ICD-10-CM | POA: Diagnosis present

## 2021-10-30 DIAGNOSIS — N179 Acute kidney failure, unspecified: Secondary | ICD-10-CM | POA: Diagnosis present

## 2021-10-30 DIAGNOSIS — K5900 Constipation, unspecified: Secondary | ICD-10-CM | POA: Diagnosis present

## 2021-10-30 DIAGNOSIS — I130 Hypertensive heart and chronic kidney disease with heart failure and stage 1 through stage 4 chronic kidney disease, or unspecified chronic kidney disease: Secondary | ICD-10-CM | POA: Diagnosis present

## 2021-10-30 DIAGNOSIS — I10 Essential (primary) hypertension: Secondary | ICD-10-CM | POA: Diagnosis present

## 2021-10-30 DIAGNOSIS — Z9841 Cataract extraction status, right eye: Secondary | ICD-10-CM

## 2021-10-30 DIAGNOSIS — F325 Major depressive disorder, single episode, in full remission: Secondary | ICD-10-CM | POA: Diagnosis present

## 2021-10-30 DIAGNOSIS — Z6825 Body mass index (BMI) 25.0-25.9, adult: Secondary | ICD-10-CM

## 2021-10-30 DIAGNOSIS — Z79899 Other long term (current) drug therapy: Secondary | ICD-10-CM

## 2021-10-30 DIAGNOSIS — I6529 Occlusion and stenosis of unspecified carotid artery: Secondary | ICD-10-CM | POA: Diagnosis present

## 2021-10-30 DIAGNOSIS — F419 Anxiety disorder, unspecified: Secondary | ICD-10-CM | POA: Diagnosis present

## 2021-10-30 DIAGNOSIS — Z888 Allergy status to other drugs, medicaments and biological substances status: Secondary | ICD-10-CM

## 2021-10-30 DIAGNOSIS — I774 Celiac artery compression syndrome: Secondary | ICD-10-CM | POA: Diagnosis present

## 2021-10-30 DIAGNOSIS — N1832 Chronic kidney disease, stage 3b: Secondary | ICD-10-CM | POA: Diagnosis present

## 2021-10-30 DIAGNOSIS — Z87442 Personal history of urinary calculi: Secondary | ICD-10-CM

## 2021-10-30 DIAGNOSIS — N183 Chronic kidney disease, stage 3 unspecified: Secondary | ICD-10-CM | POA: Diagnosis present

## 2021-10-30 DIAGNOSIS — E872 Acidosis, unspecified: Secondary | ICD-10-CM | POA: Diagnosis not present

## 2021-10-30 DIAGNOSIS — Z8371 Family history of colonic polyps: Secondary | ICD-10-CM

## 2021-10-30 DIAGNOSIS — D472 Monoclonal gammopathy: Secondary | ICD-10-CM | POA: Diagnosis present

## 2021-10-30 DIAGNOSIS — I771 Stricture of artery: Secondary | ICD-10-CM | POA: Diagnosis not present

## 2021-10-30 DIAGNOSIS — Z825 Family history of asthma and other chronic lower respiratory diseases: Secondary | ICD-10-CM

## 2021-10-30 DIAGNOSIS — K219 Gastro-esophageal reflux disease without esophagitis: Secondary | ICD-10-CM | POA: Diagnosis present

## 2021-10-30 DIAGNOSIS — K635 Polyp of colon: Secondary | ICD-10-CM | POA: Diagnosis present

## 2021-10-30 DIAGNOSIS — D72829 Elevated white blood cell count, unspecified: Secondary | ICD-10-CM | POA: Diagnosis present

## 2021-10-30 DIAGNOSIS — Z881 Allergy status to other antibiotic agents status: Secondary | ICD-10-CM

## 2021-10-30 DIAGNOSIS — Z885 Allergy status to narcotic agent status: Secondary | ICD-10-CM

## 2021-10-30 DIAGNOSIS — R609 Edema, unspecified: Secondary | ICD-10-CM | POA: Diagnosis present

## 2021-10-30 DIAGNOSIS — F32A Depression, unspecified: Secondary | ICD-10-CM | POA: Diagnosis present

## 2021-10-30 DIAGNOSIS — N1411 Contrast-induced nephropathy: Secondary | ICD-10-CM | POA: Diagnosis present

## 2021-10-30 DIAGNOSIS — A041 Enterotoxigenic Escherichia coli infection: Secondary | ICD-10-CM | POA: Insufficient documentation

## 2021-10-30 DIAGNOSIS — E785 Hyperlipidemia, unspecified: Secondary | ICD-10-CM | POA: Diagnosis present

## 2021-10-30 DIAGNOSIS — K559 Vascular disorder of intestine, unspecified: Secondary | ICD-10-CM | POA: Diagnosis present

## 2021-10-30 DIAGNOSIS — I5032 Chronic diastolic (congestive) heart failure: Secondary | ICD-10-CM | POA: Diagnosis present

## 2021-10-30 HISTORY — DX: Personal history of urinary calculi: Z87.442

## 2021-10-30 LAB — LACTIC ACID, PLASMA
Lactic Acid, Venous: 0.8 mmol/L (ref 0.5–1.9)
Lactic Acid, Venous: 0.8 mmol/L (ref 0.5–1.9)

## 2021-10-30 LAB — COMPREHENSIVE METABOLIC PANEL
ALT: 11 U/L (ref 0–44)
AST: 14 U/L — ABNORMAL LOW (ref 15–41)
Albumin: 3.6 g/dL (ref 3.5–5.0)
Alkaline Phosphatase: 55 U/L (ref 38–126)
Anion gap: 11 (ref 5–15)
BUN: 59 mg/dL — ABNORMAL HIGH (ref 8–23)
CO2: 26 mmol/L (ref 22–32)
Calcium: 9 mg/dL (ref 8.9–10.3)
Chloride: 99 mmol/L (ref 98–111)
Creatinine, Ser: 2.6 mg/dL — ABNORMAL HIGH (ref 0.44–1.00)
GFR, Estimated: 18 mL/min — ABNORMAL LOW (ref >60)
Glucose, Bld: 118 mg/dL — ABNORMAL HIGH (ref 70–99)
Potassium: 3.6 mmol/L (ref 3.5–5.1)
Sodium: 136 mmol/L (ref 135–145)
Total Bilirubin: 0.9 mg/dL (ref 0.3–1.2)
Total Protein: 6.6 g/dL (ref 6.5–8.1)

## 2021-10-30 LAB — URINALYSIS, ROUTINE W REFLEX MICROSCOPIC
Bilirubin Urine: NEGATIVE
Glucose, UA: NEGATIVE mg/dL
Hgb urine dipstick: NEGATIVE
Ketones, ur: NEGATIVE mg/dL
Leukocytes,Ua: NEGATIVE
Nitrite: NEGATIVE
Protein, ur: 30 mg/dL — AB
Specific Gravity, Urine: 1.016 (ref 1.005–1.030)
pH: 5 (ref 5.0–8.0)

## 2021-10-30 LAB — CBC
HCT: 42.1 % (ref 36.0–46.0)
Hemoglobin: 13.2 g/dL (ref 12.0–15.0)
MCH: 28.2 pg (ref 26.0–34.0)
MCHC: 31.4 g/dL (ref 30.0–36.0)
MCV: 90 fL (ref 80.0–100.0)
Platelets: 512 10*3/uL — ABNORMAL HIGH (ref 150–400)
RBC: 4.68 MIL/uL (ref 3.87–5.11)
RDW: 15 % (ref 11.5–15.5)
WBC: 22.6 10*3/uL — ABNORMAL HIGH (ref 4.0–10.5)
nRBC: 0 % (ref 0.0–0.2)

## 2021-10-30 LAB — LIPASE, BLOOD: Lipase: 32 U/L (ref 11–51)

## 2021-10-30 MED ORDER — OXYCODONE HCL 5 MG PO TABS
5.0000 mg | ORAL_TABLET | Freq: Once | ORAL | Status: AC
  Administered 2021-10-30: 5 mg via ORAL
  Filled 2021-10-30: qty 1

## 2021-10-30 MED ORDER — TRAMADOL HCL 50 MG PO TABS: 50.0000 mg | ORAL_TABLET | Freq: Two times a day (BID) | ORAL | Status: DC | PRN

## 2021-10-30 MED ORDER — SODIUM CHLORIDE 0.9 % IV BOLUS
500.0000 mL | Freq: Once | INTRAVENOUS | Status: AC
  Administered 2021-10-30: 500 mL via INTRAVENOUS

## 2021-10-30 MED ORDER — ONDANSETRON HCL 4 MG/2ML IJ SOLN: 4.0000 mg | Freq: Four times a day (QID) | INTRAMUSCULAR | Status: DC | PRN

## 2021-10-30 MED ORDER — CYANOCOBALAMIN 500 MCG PO TABS
500.0000 ug | ORAL_TABLET | Freq: Every day | ORAL | Status: DC
  Administered 2021-10-31 – 2021-11-14 (×14): 500 ug via ORAL
  Filled 2021-10-30 (×15): qty 1

## 2021-10-30 MED ORDER — HYDRALAZINE HCL 10 MG PO TABS
10.0000 mg | ORAL_TABLET | Freq: Four times a day (QID) | ORAL | Status: DC | PRN
  Filled 2021-10-30: qty 1

## 2021-10-30 MED ORDER — ACETAMINOPHEN 650 MG RE SUPP: 650.0000 mg | Freq: Four times a day (QID) | RECTAL | Status: AC | PRN

## 2021-10-30 MED ORDER — HYDRALAZINE HCL 25 MG PO TABS
25.0000 mg | ORAL_TABLET | Freq: Two times a day (BID) | ORAL | Status: DC
  Administered 2021-10-30 – 2021-11-02 (×6): 25 mg via ORAL
  Filled 2021-10-30 (×6): qty 1

## 2021-10-30 MED ORDER — PIPERACILLIN-TAZOBACTAM 3.375 G IVPB 30 MIN
3.3750 g | Freq: Once | INTRAVENOUS | Status: AC
Start: 2021-10-30 — End: 2021-10-30
  Administered 2021-10-30: 3.375 g via INTRAVENOUS
  Filled 2021-10-30: qty 50

## 2021-10-30 MED ORDER — ONDANSETRON HCL 4 MG PO TABS: 4.0000 mg | ORAL_TABLET | Freq: Four times a day (QID) | ORAL | Status: DC | PRN

## 2021-10-30 MED ORDER — ACETAMINOPHEN 500 MG PO TABS
1000.0000 mg | ORAL_TABLET | Freq: Four times a day (QID) | ORAL | Status: AC | PRN
  Administered 2021-11-02: 1000 mg via ORAL
  Filled 2021-10-30: qty 2

## 2021-10-30 MED ORDER — PIPERACILLIN-TAZOBACTAM IN DEX 2-0.25 GM/50ML IV SOLN
2.2500 g | Freq: Three times a day (TID) | INTRAVENOUS | Status: DC
  Administered 2021-10-31: 2.25 g via INTRAVENOUS
  Filled 2021-10-30 (×3): qty 50

## 2021-10-30 MED ORDER — ENOXAPARIN SODIUM 30 MG/0.3ML IJ SOSY: 30.0000 mg | PREFILLED_SYRINGE | INTRAMUSCULAR | Status: DC

## 2021-10-30 NOTE — Assessment & Plan Note (Addendum)
-   Cont hydralazine '50mg'$  q8h ?- Hydralazine 10 mg p.o. every 6 hours as needed for SBP greater than 175 ?

## 2021-10-30 NOTE — Hospital Course (Addendum)
86 year old female with past medical history of depression, anxiety, hypertension, stage IIIb chronic kidney disease and MGUS who presented to the emergency room on 3/22 with complaints of persistent colitis.  Patient has been having diarrhea on and off since September 2022 and was evaluated by GI and a CT of abdomen pelvis was done which noted colitis.  Patient given antibiotics which helped with diarrhea and abdominal pain, but then would continue intermittently.  Started having more abdominal pain as well as bright red blood per rectum and directed to the emergency room.  In the emergency room, patient found to have acute kidney injury and signs of colitis in the descending and sigmoid colon.  Patient admitted to the hospitalist service and GI consulted. ? ?Patient started on Zosyn.  Patient's stool study positive for enterotoxigenic E. coli infection.  Patient underwent colonoscopy with findings of patchy bullous and blebs and colitis which appeared to be ischemic.  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  Patient underwent angiogram on 4/4 with stent placement for celiac artery stenosis. ?

## 2021-10-30 NOTE — Assessment & Plan Note (Addendum)
-   Presumed secondary to contrast nephropathy in setting of recent CT studies involving contrast ?-Cr down to 1.45 ?-Cont on IVF ?

## 2021-10-30 NOTE — ED Triage Notes (Signed)
Pt to ED via POV stating that she was seen on Saturday. Pt was diagnosed with colitis. Pt was sent home with antibiotics. Pt states that she has been taking the medication that she was prescribed but her pain has gotten worse. Pt reports that she has pain all of the time now. Pt is not able to eat much. Pt reports that she is still passing blood in her stools as well. Pt is in NAD.  ?

## 2021-10-30 NOTE — H&P (Addendum)
History and Physical   Marilyn Sharp WJX:[id number] DOB: [birth year] DOA: 10/30/2021  PCP: Marguarite Arbour, MD  Outpatient Specialists: Dr. Malvin Johns Patient coming from: home  I have personally briefly reviewed patient's old medical records in [facility] EMR.  Chief Concern: Abdominal pain  HPI: [name] is an 86 year old female with with history of depression, anxiety, hyperlipidemia, hypertension, CKD 3B, GERD, MGUS, who presents emergency department for chief concerns of persistent colitis.  Initial vitals in the emergency department show temperature of 98.8, respiration rate of 16, heart rate 81, blood pressure 149/87, SPO2 of 96% on room air.  Serum sodium 136, potassium 3.6, chloride 99, bicarb 26, BUN 59, serum creatinine of 2.60, nonfasting blood glucose 118, GFR 18, WBC 22.6, hemoglobin 13.2, platelets of 512.  UA was negative for leukocytes and nitrates.  ED treatment: IV Zosyn 3.375 mg one-time dose, sodium chloride 500 mL bolus.  At bedside, she was able to tell me her name, age, current location, and current year.   She denies chest pain, fever, dysuria, hematuria. She reprots she has been ahving diarrhea on and off since September 2022. She reports she was evaluated by GI PA, at Dr. Horace Porteous office and was ordered a CT abdomen pelvis which showed colitis, and she was treated with given abx.  She states that the diarrhea and abdominal pain, resolved for a few weeks with antibiotic treatment.  She states that resumed intermittently and she would have periods of constipation and explosive diarrhea.  She states that she presented to the ED today due to abdominal pain and bright red blood per rectum.  She denies nausea, vomiting, chest pain, dysuria, trauma to her person.  Social history: She lives by herself at [facility]. She is a former tobacco user, quitting in 1976. She endorses infrequent etoh use, last drink was 3 weeks ago. 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   Vaccination history: She is vaccinated for covid 19 and influenza.   ROS: Constitutional: no weight change, no fever ENT/Mouth: no sore throat, no rhinorrhea Eyes: no eye pain, no vision changes Cardiovascular: no chest pain, no dyspnea,  no edema, no palpitations Respiratory: no cough, no sputum, no wheezing Gastrointestinal: no nausea, no vomiting, no diarrhea, no constipation Genitourinary: no urinary incontinence, no dysuria, no hematuria Musculoskeletal: no arthralgias, no myalgias Skin: no skin lesions, no pruritus, Neuro: + weakness, no loss of consciousness, no syncope Psych: no anxiety, no depression, + decrease appetite Heme/Lymph: no bruising, no bleeding  ED Course: Gust with emergency medicine provider, patient requiring hospitalization for chief concerns of acute kidney injury presumed secondary to GI loss.  Assessment/Plan  Principal Problem:   AKI (acute kidney injury) (HCC) Active Problems:   Major depression in remission (HCC)   GERD (gastroesophageal reflux disease)   HTN (hypertension)   Carotid stenosis, asymptomatic, minimal   Benign essential HTN   Acute kidney failure, unspecified (HCC)   Chronic kidney disease, stage 3 unspecified (HCC)   Monoclonal gammopathy   Colitis   Leukocytosis    Assessment and Plan: * AKI (acute kidney injury) (HCC) - Presumed secondary to prerenal in setting of GI loss - Creatinine clearance is calculated to be 16 - Holding home telmisartan 40 mg daily at this time - Home tramadol 50 mg every 6 hours as needed for moderate pain has been changed to 50 mg every 12 hours as needed for  moderate pain due to acute kidney injury with decrease creatinine clearance  Leukocytosis - GI and C. difficile panel ordered - Lactic acid was not elevated, 0.8x2 - CBC in the a.m.  Colitis - Etiology work-up in progress -  GI and C. difficile PCR panel ordered - Status post piperacillin- tazobactam 3.375 g IV per EDP one-time dose - I have adjusted Zosyn for AKI/creatinine clearance to 2.25 g every 8 hours IV - GI consultation placed via staff message to Dr. Tobi Bastos  Benign essential HTN - Resumed hydralazine 25 mg p.o. twice daily - Patient takes diltiazem 180 mg daily - Home losartan not resumed - Hydralazine 10 mg p.o. every 6 hours as needed for SBP greater than 175, 4 doses ordered  Chart reviewed.   DVT prophylaxis: Heparin 5000 units Code Status: full code  Diet: heart healthy Family Communication: patient declines my offer; she states her family knows already Disposition Plan: pending clinical course Consults called: none at this time, recommend day team to consult GI in the AM Admission status: med-surg, obs  Past Medical History:  Diagnosis Date   Allergy    Anxiety    Arthritis    Chicken pox    Depression    Family history of polyps in the colon    GERD (gastroesophageal reflux disease)    Headache    Heart murmur    Heart palpitations    Hyperlipidemia    Hypertension    Kidney stone    Shingles 2012   Stress fracture of lumbar vertebra    Past Surgical History:  Procedure Laterality Date   bilateral blephoplasty  2001   BREAST CYST ASPIRATION  2001   BREAST SURGERY Left    benign cyst   CATARACT EXTRACTION, BILATERAL  2007   EYE SURGERY     KYPHOPLASTY N/A 09/11/2015   Procedure: KYPHOPLASTY T12;  Surgeon: Kennedy Bucker, MD;  Location: [facility] ORS;  Service: Orthopedics;  Laterality: N/A;   RHINOPLASTY     Social History:  reports that she quit smoking about 48 years ago. Her smoking use included cigarettes. She has a 20.00 pack-year smoking history. She has never used smokeless tobacco. She reports current alcohol use of about 2.0 standard drinks per week. She reports that she does not use drugs.  Allergies  Allergen Reactions   Alendronate     Other reaction(s): Other  (See Comments) Chest pain   Amlodipine Besylate Other (See Comments)    "dizzy"   Ciprofloxacin Hives   Codeine Itching   Lovastatin     Other reaction(s): Muscle Pain   Septra [Sulfamethoxazole-Trimethoprim] Itching   Family History  Problem Relation Age of Onset   Arthritis Mother    Hypertension Mother    Osteoporosis Mother    COPD Father    Arthritis Maternal Grandmother    Heart disease Maternal Grandfather    Colon cancer Neg Hx    Breast cancer Neg Hx    Family history: Family history reviewed and not pertinent.  Prior to Admission medications   Medication Sig Start Date End Date Taking? Authorizing Provider  acetaminophen (TYLENOL) 325 MG tablet Take 650 mg by mouth every 6 (six) hours as needed.    [provider]  amoxicillin-clavulanate (AUGMENTIN) 875-125 MG tablet Take 1 tablet by mouth 2 (two) times daily for 7 days. 10/26/21 11/02/21  Jene Every, MD  Cholecalciferol (VITAMIN D3) 50 MCG (2000 UT) capsule Take by mouth.    [provider]  diltiazem (TIAZAC) 180 MG  24 hr capsule Take 180 mg by mouth daily. 05/12/19   [provider]  GAVILYTE-G 236 g solution See admin instructions. 07/11/21   [provider]  hydrALAZINE (APRESOLINE) 25 MG tablet Take 25 mg by mouth 2 (two) times daily.    [provider]  loratadine (CLARITIN) 10 MG tablet Take 10 mg by mouth daily as needed. Reported on 09/11/2015 Patient not taking: Reported on 07/17/2021    [provider]  polyethylene glycol (MIRALAX / GLYCOLAX) 17 g packet Take 17 g by mouth daily.    [provider]  telmisartan (MICARDIS) 40 MG tablet Take 40 mg by mouth daily. 07/14/21   [provider]  traMADol (ULTRAM) 50 MG tablet Take 1 tablet (50 mg total) by mouth every 6 (six) hours as needed. 10/26/21 10/26/22  Jene Every, MD  vitamin B-12 (CYANOCOBALAMIN) 500 MCG tablet Take 500 mcg by mouth daily.    [provider]   Physical  Exam: Vitals:   10/30/21 1739 10/30/21 1950 10/30/21 2000 10/30/21 2106  BP: (!) 152/88 (!) 154/91 (!) 165/86 (!) 172/89  Pulse: 85 84 83 83  Resp: 17 17 18 20   Temp:    98.9 F (37.2 C)  TempSrc:    Oral  SpO2: 98% 94% 93% 93%   Constitutional: appears younger than chronological age, NAD, calm, comfortable Eyes: PERRL, lids and conjunctivae normal ENMT: Mucous membranes are dry. Posterior pharynx clear of any exudate or lesions. Age-appropriate dentition. Hearing appropriate/loss Neck: normal, supple, no masses, no thyromegaly Respiratory: clear to auscultation bilaterally, no wheezing, no crackles. Normal respiratory effort. No accessory muscle use.  Cardiovascular: Regular rate and rhythm, no murmurs / rubs / gallops. No extremity edema. 2+ pedal pulses. No carotid bruits.  Abdomen: + tenderness (more on left), no masses palpated, no hepatosplenomegaly. Bowel sounds positive.  Musculoskeletal: no clubbing / cyanosis. No joint deformity upper and lower extremities. Good ROM, no contractures, no atrophy. Normal muscle tone.  Skin: no rashes, lesions, ulcers. No induration Neurologic: Sensation intact. Strength 5/5 in all 4.  Psychiatric: Normal judgment and insight. Alert and oriented x 3. Normal mood.   EKG: not indicated  Chest x-ray on Admission: I personally reviewed and I agree with radiologist reading as below.  CT ABDOMEN PELVIS WO CONTRAST  Result Date: 10/30/2021 CLINICAL DATA:  Abdominal pain EXAM: CT ABDOMEN AND PELVIS WITHOUT CONTRAST TECHNIQUE: Multidetector CT imaging of the abdomen and pelvis was performed following the standard protocol without IV contrast. RADIATION DOSE REDUCTION: This exam was performed according to the departmental dose-optimization program which includes automated exposure control, adjustment of the mA and/or kV according to patient size and/or use of iterative reconstruction technique. COMPARISON:  CT done on 10/26/2021 FINDINGS: Lower chest: Heart  is enlarged in size. Small pericardial effusion is present. Hepatobiliary: No focal abnormality is seen in the liver. There are calcified gallbladder stone. There is no significant dilation of bile ducts. Pancreas: No focal abnormality is seen. Spleen: Unremarkable. Adrenals/Urinary Tract: Adrenals are unremarkable. There is dense patchy delayed nephrogram in both kidneys. Findings suggest possible acute tubular necrosis or systemic hypotension or contrast nephropathy. Other less likely possibilities would include bilateral renal vein thrombosis or bilateral renal artery stenosis. There is no hydronephrosis. There is small amount of contrast in the pelvocaliceal systems in both kidneys. There is residual contrast in the urinary bladder. Stomach/Bowel: Stomach is unremarkable. Small bowel loops are not dilated. Appendix is not dilated. There is abnormal wall thickening in the descending and  sigmoid colon. Findings may suggest inflammatory or ischemic or infectious colitis. There is no loculated pericolic fluid collection. Scattered diverticula are seen in the colon without signs of focal acute diverticulitis. There is interval progression of signs of colitis in the sigmoid colon. Vascular/Lymphatic: There are scattered coarse calcifications in the abdominal aorta and its major branches. Reproductive: Unremarkable. Other: There is no ascites or pneumoperitoneum. Musculoskeletal: Compression fracture and vertebroplasty in the body of T12 vertebra has not changed. IMPRESSION: Extensive patchy delayed dense nephrogram is seen in both kidneys. This may suggest acute tubular necrosis or systemic hypotension or contrast nephropathy. There is no hydronephrosis. There is abnormal wall thickening in the descending and sigmoid colon interval progression in the sigmoid colon suggesting inflammatory or ischemic or infectious colitis. Scattered diverticula are seen in colon without signs of focal diverticulitis. There is no  evidence of intestinal obstruction or pneumoperitoneum. Gallbladder stone.  Small pericardial effusion. Other findings as described in the body of the report. Electronically Signed   By: Ernie Avena M.D.   On: 10/30/2021 17:42    Labs on Admission: I have personally reviewed following labs  CBC: Recent Labs  Lab 10/26/21 1036 10/30/21 1730  WBC 18.2* 22.6*  HGB 14.3 13.2  HCT 44.9 42.1  MCV 89.8 90.0  PLT 482* 512*   Basic Metabolic Panel: Recent Labs  Lab 10/26/21 1036 10/30/21 1524  NA 143 136  K 3.5 3.6  CL 106 99  CO2 31 26  GLUCOSE 103* 118*  BUN 29* 59*  CREATININE 1.24* 2.60*  CALCIUM 9.2 9.0   GFR: Estimated Creatinine Clearance: 14.8 mL/min (A) (by C-G formula based on SCr of 2.6 mg/dL (H)).  Liver Function Tests: Recent Labs  Lab 10/26/21 1036 10/30/21 1524  AST 16 14*  ALT 12 11  ALKPHOS 63 55  BILITOT 0.9 0.9  PROT 6.4* 6.6  ALBUMIN 3.6 3.6   Recent Labs  Lab 10/26/21 1036 10/30/21 1524  LIPASE 33 32   Urine analysis:    Component Value Date/Time   COLORURINE YELLOW (A) 10/30/2021 1905   APPEARANCEUR HAZY (A) 10/30/2021 1905   LABSPEC 1.016 10/30/2021 1905   PHURINE 5.0 10/30/2021 1905   GLUCOSEU NEGATIVE 10/30/2021 1905   HGBUR NEGATIVE 10/30/2021 1905   BILIRUBINUR NEGATIVE 10/30/2021 1905   KETONESUR NEGATIVE 10/30/2021 1905   PROTEINUR 30 (A) 10/30/2021 1905   NITRITE NEGATIVE 10/30/2021 1905   LEUKOCYTESUR NEGATIVE 10/30/2021 1905   Dr. Sedalia Muta [facility]  If 7PM-7AM, please contact overnight-coverage provider If 7AM-7PM, please contact day coverage provider [email or web address]  10/31/2021, 2:34 AM

## 2021-10-30 NOTE — Plan of Care (Signed)
  Problem: Clinical Measurements: Goal: Ability to maintain clinical measurements within normal limits will improve Outcome: Progressing   Problem: Clinical Measurements: Goal: Will remain free from infection Outcome: Progressing   

## 2021-10-30 NOTE — Assessment & Plan Note (Addendum)
-   GI and C. difficile PCR panel reviewed. CDiff is neg. Stool study is pos for ETEC.  Patient was on Zosyn.  Completed full course. ?

## 2021-10-30 NOTE — Progress Notes (Signed)
PHARMACIST - PHYSICIAN COMMUNICATION ? ?CONCERNING:  Enoxaparin (Lovenox) for DVT Prophylaxis  ? ?DESCRIPTION: ?Patient was prescribed enoxaprin '40mg'$  q24 hours for VTE prophylaxis.  ? ?There were no vitals filed for this visit. ? ?There is no height or weight on file to calculate BMI. ? ?Estimated Creatinine Clearance: 14.8 mL/min (A) (by C-G formula based on SCr of 2.6 mg/dL (H)). ? ? ?Patient is candidate for enoxaparin '30mg'$  every 24 hours based on CrCl <46m/min or Weight <45kg ? ?RECOMMENDATION: ?[facility] ? ?Patient is now receiving enoxaparin 30 mg every 24 hours  ? ? ?MDarnelle Bos PharmD ?Clinical Pharmacist  ?10/30/2021 ?7:51 PM ? ?

## 2021-10-30 NOTE — ED Notes (Signed)
RN attempted to contact 2c nurse for handoff. Per Network engineer, nurse is looking at chart and than will sign off on handoff. ?

## 2021-10-30 NOTE — ED Provider Notes (Signed)
? ?[facility] ?Provider Note ? ? ? Event Date/Time  ? First MD Initiated Contact with Patient 10/30/21 1623   ?  (approximate) ? ? ?History  ? ?Abdominal Pain ? ? ?HPI ? ?Marilyn Sharp is a 86 y.o. female with hypertension, hyperlipidemia, headaches who comes in with concern for worsening abdominal pain.  Patient reports over the past 4 days she has been compliant in taking the tramadol and the Augmentin but having worsening left lower quadrant abdominal pain that is worse with movements and with bowel movements.  She reports having multiple stools daily.  She states that she thinks she was on some antibiotics prior to this all starting.  She reports having blood mixed into her stool and continued to have some of this bleeding.  She reports prior colonoscopies have been reassuring but states it was a long time ago.  She reports decreased p.o. intake. ? ?Patient reports that she was seen on Saturday and diagnosed with colitis.  She was sent home on antibiotics.  Patient reports that she has been taking her medications but her pain is gotten a lot worse.  Patient reports still having some blood in her stools. ? ?I reviewed the note from 3/18 where patient had left lower quadrant pain and had a CT scan done with concern for colitis and patient had a white count of 18,000.  He recommended admission but patient preferred to go home.  Patient prescribed Augmentin and discharged. ? ?Physical Exam  ? ?Triage Vital Signs: ?ED Triage Vitals  ?Enc Vitals Group  ?   BP 10/30/21 1516 (!) 149/87  ?   Pulse Rate 10/30/21 1516 81  ?   Resp 10/30/21 1516 16  ?   Temp 10/30/21 1516 98.8 ?F (37.1 ?C)  ?   Temp Source 10/30/21 1516 Oral  ?   SpO2 10/30/21 1516 96 %  ?   Weight --   ?   Height --   ?   Head Circumference --   ?   Peak Flow --   ?   Pain Score 10/30/21 1515 5  ?   Pain Loc --   ?   Pain Edu? --   ?   Excl. in [place]? --   ? ? ?Most recent vital signs: ?Vitals:  ? 10/30/21 1516  ?BP: (!) 149/87  ?Pulse:  81  ?Resp: 16  ?Temp: 98.8 ?F (37.1 ?C)  ?SpO2: 96%  ? ? ? ?General: Awake, no distress.  ?CV:  Good peripheral perfusion.  ?Resp:  Normal effort.  ?Abd:  No distention.  Tender in the left lower quadrant ?Other:  Some swelling noted in her bilateral legs ?No gross blood on rectal exam. ? ? ?ED Results / Procedures / Treatments  ? ?Labs ?(all labs ordered are listed, but only abnormal results are displayed) ?Labs Reviewed  ?COMPREHENSIVE METABOLIC PANEL - Abnormal; Notable for the following components:  ?    Result Value  ? Glucose, Bld 118 (*)   ? BUN 59 (*)   ? Creatinine, Ser 2.60 (*)   ? AST 14 (*)   ? GFR, Estimated 18 (*)   ? All other components within normal limits  ?LIPASE, BLOOD  ?URINALYSIS, ROUTINE W REFLEX MICROSCOPIC  ?CBC  ? ? ? ?RADIOLOGY ?I have reviewed the the CT personally and she is got some colitis noted ? ? ?PROCEDURES: ? ?Critical Care performed: No ? ?Procedures ? ? ?MEDICATIONS ORDERED IN ED: ?Medications  ?sodium chloride 0.9 %  bolus 500 mL (0 mLs Intravenous Stopped 10/30/21 1904)  ? ? ? ?IMPRESSION / MDM / ASSESSMENT AND PLAN / ED COURSE  ?I reviewed the triage vital signs and the nursing notes. ?             ?               ? ?Differential diagnosis includes, but is not limited to, colitis, abscess, perforation.  Dehydration.  We will get labs to further evaluate. ? ?Creatinine shows a new bump in her kidney function up to 2.6 ?Lipase is normal. ? ?Patient does have some fluid on her legs but she reports this is much more down than previously and her symptoms seem to be more consistent with new AKI from dehydration rather than fluid overload so we will trial a fluid bolus ? ? ?CT scan shows some dense nephrograms I suspect this is most likely related to the contrast that she is gone recently but she does have a new AKI she will need to continue to monitor this.  There is also concern for worsening colitis.  Add a lactate to make sure no signs of ischemic colitis but seems less likely  given the elevated white count I added on some Zosyn for patient.  She otherwise does not meet sepsis criteria.  Will discuss with hospital team for admission ? ? ? ?FINAL CLINICAL IMPRESSION(S) / ED DIAGNOSES  ? ?Final diagnoses:  ?Colitis  ?AKI (acute kidney injury) ([place])  ? ? ? ?Rx / DC Orders  ? ?ED Discharge Orders   ? ? None  ? ?  ? ? ? ?Note:  This document was prepared using Dragon voice recognition software and may include unintentional dictation errors. ?  ?Vanessa Bonnie, MD ?10/30/21 1928 ? ?

## 2021-10-31 ENCOUNTER — Encounter: Payer: Self-pay | Admitting: Internal Medicine

## 2021-10-31 ENCOUNTER — Observation Stay: Payer: Medicare Other

## 2021-10-31 DIAGNOSIS — Z881 Allergy status to other antibiotic agents status: Secondary | ICD-10-CM | POA: Diagnosis not present

## 2021-10-31 DIAGNOSIS — K551 Chronic vascular disorders of intestine: Secondary | ICD-10-CM | POA: Diagnosis present

## 2021-10-31 DIAGNOSIS — Z885 Allergy status to narcotic agent status: Secondary | ICD-10-CM | POA: Diagnosis not present

## 2021-10-31 DIAGNOSIS — I5031 Acute diastolic (congestive) heart failure: Secondary | ICD-10-CM | POA: Diagnosis not present

## 2021-10-31 DIAGNOSIS — I10 Essential (primary) hypertension: Secondary | ICD-10-CM | POA: Diagnosis not present

## 2021-10-31 DIAGNOSIS — Y92239 Unspecified place in hospital as the place of occurrence of the external cause: Secondary | ICD-10-CM | POA: Diagnosis not present

## 2021-10-31 DIAGNOSIS — I771 Stricture of artery: Secondary | ICD-10-CM | POA: Diagnosis present

## 2021-10-31 DIAGNOSIS — Z87442 Personal history of urinary calculi: Secondary | ICD-10-CM | POA: Diagnosis not present

## 2021-10-31 DIAGNOSIS — I774 Celiac artery compression syndrome: Secondary | ICD-10-CM | POA: Diagnosis not present

## 2021-10-31 DIAGNOSIS — K219 Gastro-esophageal reflux disease without esophagitis: Secondary | ICD-10-CM | POA: Diagnosis present

## 2021-10-31 DIAGNOSIS — K635 Polyp of colon: Secondary | ICD-10-CM | POA: Diagnosis not present

## 2021-10-31 DIAGNOSIS — I5032 Chronic diastolic (congestive) heart failure: Secondary | ICD-10-CM | POA: Diagnosis present

## 2021-10-31 DIAGNOSIS — A041 Enterotoxigenic Escherichia coli infection: Secondary | ICD-10-CM | POA: Diagnosis not present

## 2021-10-31 DIAGNOSIS — F32A Depression, unspecified: Secondary | ICD-10-CM | POA: Diagnosis present

## 2021-10-31 DIAGNOSIS — K633 Ulcer of intestine: Secondary | ICD-10-CM | POA: Diagnosis present

## 2021-10-31 DIAGNOSIS — D72829 Elevated white blood cell count, unspecified: Secondary | ICD-10-CM | POA: Diagnosis present

## 2021-10-31 DIAGNOSIS — Z87891 Personal history of nicotine dependence: Secondary | ICD-10-CM | POA: Diagnosis not present

## 2021-10-31 DIAGNOSIS — E785 Hyperlipidemia, unspecified: Secondary | ICD-10-CM | POA: Diagnosis present

## 2021-10-31 DIAGNOSIS — E440 Moderate protein-calorie malnutrition: Secondary | ICD-10-CM | POA: Diagnosis present

## 2021-10-31 DIAGNOSIS — Z8371 Family history of colonic polyps: Secondary | ICD-10-CM | POA: Diagnosis not present

## 2021-10-31 DIAGNOSIS — E872 Acidosis, unspecified: Secondary | ICD-10-CM | POA: Diagnosis not present

## 2021-10-31 DIAGNOSIS — B9620 Unspecified Escherichia coli [E. coli] as the cause of diseases classified elsewhere: Secondary | ICD-10-CM | POA: Diagnosis present

## 2021-10-31 DIAGNOSIS — D472 Monoclonal gammopathy: Secondary | ICD-10-CM | POA: Diagnosis present

## 2021-10-31 DIAGNOSIS — Z888 Allergy status to other drugs, medicaments and biological substances status: Secondary | ICD-10-CM | POA: Diagnosis not present

## 2021-10-31 DIAGNOSIS — F325 Major depressive disorder, single episode, in full remission: Secondary | ICD-10-CM | POA: Diagnosis present

## 2021-10-31 DIAGNOSIS — K529 Noninfective gastroenteritis and colitis, unspecified: Secondary | ICD-10-CM | POA: Diagnosis present

## 2021-10-31 DIAGNOSIS — K559 Vascular disorder of intestine, unspecified: Secondary | ICD-10-CM | POA: Diagnosis present

## 2021-10-31 DIAGNOSIS — E46 Unspecified protein-calorie malnutrition: Secondary | ICD-10-CM | POA: Diagnosis not present

## 2021-10-31 DIAGNOSIS — I130 Hypertensive heart and chronic kidney disease with heart failure and stage 1 through stage 4 chronic kidney disease, or unspecified chronic kidney disease: Secondary | ICD-10-CM | POA: Diagnosis present

## 2021-10-31 DIAGNOSIS — N1832 Chronic kidney disease, stage 3b: Secondary | ICD-10-CM | POA: Diagnosis present

## 2021-10-31 DIAGNOSIS — R2240 Localized swelling, mass and lump, unspecified lower limb: Secondary | ICD-10-CM | POA: Diagnosis not present

## 2021-10-31 DIAGNOSIS — N179 Acute kidney failure, unspecified: Secondary | ICD-10-CM

## 2021-10-31 DIAGNOSIS — N170 Acute kidney failure with tubular necrosis: Secondary | ICD-10-CM | POA: Diagnosis not present

## 2021-10-31 DIAGNOSIS — I6529 Occlusion and stenosis of unspecified carotid artery: Secondary | ICD-10-CM | POA: Diagnosis present

## 2021-10-31 DIAGNOSIS — K5900 Constipation, unspecified: Secondary | ICD-10-CM | POA: Diagnosis present

## 2021-10-31 LAB — CBC
HCT: 36.4 % (ref 36.0–46.0)
Hemoglobin: 11.7 g/dL — ABNORMAL LOW (ref 12.0–15.0)
MCH: 28.5 pg (ref 26.0–34.0)
MCHC: 32.1 g/dL (ref 30.0–36.0)
MCV: 88.8 fL (ref 80.0–100.0)
Platelets: 485 10*3/uL — ABNORMAL HIGH (ref 150–400)
RBC: 4.1 MIL/uL (ref 3.87–5.11)
RDW: 15.1 % (ref 11.5–15.5)
WBC: 19.1 10*3/uL — ABNORMAL HIGH (ref 4.0–10.5)
nRBC: 0 % (ref 0.0–0.2)

## 2021-10-31 LAB — GASTROINTESTINAL PANEL BY PCR, STOOL (REPLACES STOOL CULTURE)

## 2021-10-31 LAB — BASIC METABOLIC PANEL
Anion gap: 8 (ref 5–15)
BUN: 62 mg/dL — ABNORMAL HIGH (ref 8–23)
CO2: 27 mmol/L (ref 22–32)
Calcium: 8.4 mg/dL — ABNORMAL LOW (ref 8.9–10.3)
Chloride: 105 mmol/L (ref 98–111)
Creatinine, Ser: 2.66 mg/dL — ABNORMAL HIGH (ref 0.44–1.00)
GFR, Estimated: 17 mL/min — ABNORMAL LOW (ref >60)
Glucose, Bld: 91 mg/dL (ref 70–99)
Potassium: 3.3 mmol/L — ABNORMAL LOW (ref 3.5–5.1)
Sodium: 140 mmol/L (ref 135–145)

## 2021-10-31 LAB — C DIFFICILE QUICK SCREEN W PCR REFLEX
C Diff antigen: NEGATIVE
C Diff interpretation: NOT DETECTED
C Diff toxin: NEGATIVE

## 2021-10-31 MED ORDER — HEPARIN SODIUM (PORCINE) 5000 UNIT/ML IJ SOLN
5000.0000 [IU] | Freq: Three times a day (TID) | INTRAMUSCULAR | Status: DC
  Administered 2021-11-01 – 2021-11-14 (×40): 5000 [IU] via SUBCUTANEOUS
  Filled 2021-10-31 (×40): qty 1

## 2021-10-31 MED ORDER — PIPERACILLIN-TAZOBACTAM 3.375 G IVPB
3.3750 g | Freq: Three times a day (TID) | INTRAVENOUS | Status: AC
Start: 2021-10-31 — End: 2021-11-06
  Administered 2021-10-31 – 2021-11-06 (×20): 3.375 g via INTRAVENOUS
  Filled 2021-10-31 (×20): qty 50

## 2021-10-31 MED ORDER — SODIUM CHLORIDE 0.9 % IV SOLN: INTRAVENOUS | Status: AC

## 2021-10-31 MED ORDER — SODIUM CHLORIDE 0.9 % IV SOLN: INTRAVENOUS | Status: DC

## 2021-10-31 MED ORDER — BOOST / RESOURCE BREEZE PO LIQD CUSTOM
1.0000 | Freq: Three times a day (TID) | ORAL | Status: DC
  Administered 2021-10-31 – 2021-11-04 (×10): 1 via ORAL

## 2021-10-31 NOTE — Care Management Obs Status (Signed)
MEDICARE OBSERVATION STATUS NOTIFICATION ? ? ?Patient Details  ?Name: Marilyn Sharp ?MRN: [id number] ?Date of Birth: February 15, 1936 ? ? ?Medicare Observation Status Notification Given:  Yes ? ? ? ?Beverly Sessions, RN ?10/31/2021, 2:37 PM ?

## 2021-10-31 NOTE — Plan of Care (Signed)
?  Problem: Education: ?Goal: Knowledge of General Education information will improve ?Description: Including pain rating scale, medication(s)/side effects and non-pharmacologic comfort measures ?Outcome: Progressing ?  ?Problem: Activity: ?Goal: Risk for activity intolerance will decrease ?Outcome: Progressing ?  ?Problem: Coping: ?Goal: Level of anxiety will decrease ?Outcome: Progressing ?  ?Problem: Elimination: ?Goal: Will not experience complications related to bowel motility ?Outcome: Progressing ?Goal: Will not experience complications related to urinary retention ?Outcome: Progressing ?  ?Problem: Safety: ?Goal: Ability to remain free from injury will improve ?Outcome: Progressing ?  ?

## 2021-10-31 NOTE — Assessment & Plan Note (Addendum)
Initially patient on hydralazine 25 twice daily plus Demadex.  With infection and stenosis, blood pressure is much more elevated requiring increase in hydralazine.  Pressures since then have been quite stable, so we will resume her previous home regimen. ?

## 2021-10-31 NOTE — TOC Initial Note (Signed)
Transition of Care (TOC) - Initial/Assessment Note  ? ? ?Patient Details  ?Name: Marilyn Sharp ?MRN: [id number] ?Date of Birth: Dec 25, 1935 ? ?Transition of Care (TOC) CM/SW Contact:    ?Beverly Sessions, RN ?Phone Number: ?10/31/2021, 2:40 PM ? ?Clinical Narrative:                 ? ?Admitted for: Colitis  ?Admitted from: [facility] ?PCP: Doy Hutching - patient states she drives her self to appointments ?[facility] ?Current home health/prior home health/DME: ?Patient states that she goes to PT on site ? ?Patient declines PT eval while inpatient and plan to return at at discharge.  Patient states that family or friend will transport at discharge  ? ?Expected Discharge Plan: Home/Self Care ?Barriers to Discharge: Continued Medical Work up ? ? ?Patient Goals and CMS Choice ?  ?  ?  ? ?Expected Discharge Plan and Services ?Expected Discharge Plan: Home/Self Care ?  ?  ?  ?Living arrangements for the past 2 months: [place] ?                ?  ?  ?  ?  ?  ?  ?  ?  ?  ?  ? ?Prior Living Arrangements/Services ?Living arrangements for the past 2 months: [place] ?Lives with:: Facility Resident ?Patient language and need for interpreter reviewed:: Yes ?Do you feel safe going back to the place where you live?: Yes      ?Need for Family Participation in Patient Care: Yes (Comment) ?Care giver support system in place?: Yes (comment) ?  ?Criminal Activity/Legal Involvement Pertinent to Current Situation/Hospitalization: No - Comment as needed ? ?Activities of Daily Living ?Home Assistive Devices/Equipment: Scales, Walker (specify type), Other (Comment) (rollator) ?ADL Screening (condition at time of admission) ?Patient's cognitive ability adequate to safely complete daily activities?: Yes ?Is the patient deaf or have difficulty hearing?: Yes ?Does the patient have difficulty seeing, even when wearing glasses/contacts?: No ?Does the patient have difficulty  concentrating, remembering, or making decisions?: No ?Patient able to express need for assistance with ADLs?: No ?Does the patient have difficulty dressing or bathing?: No ?Independently performs ADLs?: No ?Does the patient have difficulty walking or climbing stairs?: No ?Weakness of Legs: Both ?Weakness of Arms/Hands: Both ? ?Permission Sought/Granted ?  ?  ?   ?   ?   ?   ? ?Emotional Assessment ?  ?  ?  ?Orientation: : Oriented to Self, Oriented to Place, Oriented to  Time, Oriented to Situation ?Alcohol / Substance Use: Not Applicable ?Psych Involvement: No (comment) ? ?Admission diagnosis:  Colitis [K52.9] ?AKI (acute kidney injury) ([place]) [N17.9] ?Patient Active Problem List  ? Diagnosis Date Noted  ? Leukocytosis 10/31/2021  ? Malnutrition of moderate degree 10/31/2021  ? AKI (acute kidney injury) ([place]) 10/30/2021  ? Enterotoxigenic Escherichia coli infection 10/30/2021  ? Acute kidney failure, unspecified ([place]) 05/24/2019  ? Chronic kidney disease, stage 3 unspecified ([place]) 05/24/2019  ? Monoclonal gammopathy 05/24/2019  ? Unspecified hypertensive kidney disease with chronic kidney disease stage I through stage IV, or unspecified 05/24/2019  ? 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 Occult blood positive stool 03/23/2018  ? Tremor 01/05/2018  ? Osteoporosis, post-menopausal 01/02/2016  ? Benign essential HTN 04/23/2015  ? Shortness of breath 04/23/2015  ? Dizziness and giddiness 02/16/2015  ? Counseling regarding end of life decision making 02/16/2015  ? Acute bronchitis 07/10/2014  ? Carotid stenosis, asymptomatic, minimal 05/30/2014  ?  Right shoulder pain 10/13/2013  ? Pain, eye, right 10/13/2013  ? Major depression in remission ([place]) 06/02/2013  ? GERD (gastroesophageal reflux disease) 06/02/2013  ? Allergic rhinitis 06/02/2013  ? Palpitations 06/02/2013  ? Mitral valve prolapse 06/02/2013  ? HTN (hypertension) 06/02/2013  ? High cholesterol 06/02/2013  ? Personal history of colonic polyps 06/02/2013  ?  Osteoporosis, unspecified 06/02/2013  ? Post herpetic neuralgia 06/02/2013  ? ?PCP:  Idelle Crouch, MD ?Pharmacy:   ?[id number]?Phone: [phone]: [phone]? ?[id number] [place] [place] [facility] [address] ?[address] ?[place]Phone: [phone]: [phone] ?[place] [address]: [phone]: [phone] ? ? ? ?Social Determinants of Health (SDOH) Interventions ?  ? ?Readmission Risk Interventions ?   ? View : No data to display.  ?  ?  ?  ? ? ? ?

## 2021-10-31 NOTE — Consult Note (Signed)
? ? ? ?Cephas Darby, MD ?[address]  ?[address]  ?[place], [place] [address]  ?Main: [phone]  ?Fax: [phone] ?Pager: [phone] ? ? Consultation ? ?Referring Provider:     No ref. provider found ?Primary Care Physician:  Idelle Crouch, MD ?Primary Gastroenterologist:  Dr. Alice Reichert     ?Reason for Consultation:     Colitis ? ?Date of Admission:  10/30/2021 ?Date of Consultation:  10/31/2021 ?       ? HPI:   ?Marilyn Sharp is a 86 y.o. 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  Patient was in the ER on 3/18, was diagnosed with left-sided colitis based on the CT scan and was discharged home on Augmentin.  She had leukocytosis, WBC 18.2.  She presented to ER yesterday due to worsening of left-sided abdominal pain as well as rectal bleeding and diarrhea.  She underwent repeat CT scan yesterday which revealed persistent thickening of the descending and sigmoid colon with interval progression, associated diverticulosis.  She also has gallbladder stone as well as mild intrahepatic biliary dilation.  She is also found to have worsening leukocytosis, 22.6, GI profile PCR came back positive for enterotoxigenic E. coli, stool for C. difficile was negative.  She is on Zosyn.  Her WBC count slightly improved today.  Patient's daughter is bedside.  Patient feels weak, dehydrated.  She denies any nausea or vomiting.  Patient was initially evaluated by Dr. Alice Reichert in 04/2021 for left-sided colitis.  The working diagnosis was ischemic colitis, stool studies were negative for infection, fecal calprotectin levels were elevated to 709.  Patient was conservatively managed with supportive therapy, she underwent repeat CT scan in November 2022 and found to have moderate stool burden, was managed as constipation. ? ? ?NSAIDs: None ? ?Antiplts/Anticoagulants/Anti thrombotics: None ? ?GI Procedures:  ?-  CSY: 09/23/2002 - one 5 mm hyperplastic polyp in transverse colon, diverticulosis  ?- EGD: 10/16/2005 - Savary-Miller Grade I esophagitis, gastritis, duodenitis, abnormal motility noted in middle third of esophagus  ?- CSY: 08/18/2011 - normal appearing terminal ileum, two flat polyps found in proximal transverse colon 8-9 mm in size with path showing SSA x2 ?- CSY: 09/02/2013 - left-sided diverticulosis, flat polyp measuring 8 to 10 mm in size found in the transverse colon, and a sessile polyp measuring 6 to 7 mm found in the sigmoid colon and mild diverticulosis. Path showing SSA and TA.  ? ?Past Medical History:  ?Diagnosis Date  ? Allergy   ? Anxiety   ? Arthritis   ? Chicken pox   ? Depression   ? Family history of polyps in the colon   ? GERD (gastroesophageal reflux disease)   ? Headache   ? Heart murmur   ? Heart palpitations   ? Hyperlipidemia   ? Hypertension   ? Kidney stone   ? Shingles 2012  ? Stress fracture of lumbar vertebra   ? ? ?Past Surgical History:  ?Procedure Laterality Date  ? bilateral blephoplasty  2001  ? BREAST CYST ASPIRATION  2001  ? BREAST SURGERY Left   ? benign cyst  ? CATARACT EXTRACTION, BILATERAL  2007  ? EYE SURGERY    ? KYPHOPLASTY N/A 09/11/2015  ? Procedure: KYPHOPLASTY T12;  Surgeon: Hessie Knows, MD;  Location: [facility] ORS;  Service: Orthopedics;  Laterality: N/A;  ? RHINOPLASTY    ? ? ?Prior to Admission medications   ?  Medication Sig Start Date End Date Taking? Authorizing Provider  ?torsemide (DEMADEX) 20 MG tablet Take 20 mg by mouth daily.   Yes [provider]  ?acetaminophen (TYLENOL) 325 MG tablet Take 650 mg by mouth every 6 (six) hours as needed.    [provider]  ?amoxicillin-clavulanate (AUGMENTIN) 875-125 MG tablet Take 1 tablet by mouth 2 (two) times daily for 7 days. 10/26/21 11/02/21  Lavonia Drafts, MD  ?Cholecalciferol (VITAMIN D3) 50 MCG (2000 UT) capsule Take by mouth daily.    [provider]  ?hydrALAZINE (APRESOLINE) 25 MG tablet Take  25 mg by mouth 2 (two) times daily.    [provider]  ?polyethylene glycol (MIRALAX / GLYCOLAX) 17 g packet Take 17 g by mouth daily.    [provider]  ?traMADol (ULTRAM) 50 MG tablet Take 1 tablet (50 mg total) by mouth every 6 (six) hours as needed. 10/26/21 10/26/22  Lavonia Drafts, MD  ?vitamin B-12 (CYANOCOBALAMIN) 500 MCG tablet Take 500 mcg by mouth daily.    [provider]  ? ? ?Current Facility-Administered Medications:  ?  0.9 %  sodium chloride infusion, , Intravenous, Continuous, Donne Hazel, MD, Last Rate: 75 mL/hr at 10/31/21 1337, New Bag at 10/31/21 1337 ?  acetaminophen (TYLENOL) tablet 1,000 mg, 1,000 mg, Oral, Q6H PRN **OR** acetaminophen (TYLENOL) suppository 650 mg, 650 mg, Rectal, Q6H PRN, Cox, Amy N, DO ?  feeding supplement (BOOST / RESOURCE BREEZE) liquid 1 Container, 1 Container, Oral, TID BM, Donne Hazel, MD ?  Derrill Memo ON 11/01/2021] heparin injection 5,000 Units, 5,000 Units, Subcutaneous, Q8H, Cox, Amy N, DO ?  hydrALAZINE (APRESOLINE) tablet 10 mg, 10 mg, Oral, Q6H PRN, Cox, Amy N, DO ?  hydrALAZINE (APRESOLINE) tablet 25 mg, 25 mg, Oral, BID, Cox, Amy N, DO, 25 mg at 10/31/21 2025 ?  ondansetron (ZOFRAN) tablet 4 mg, 4 mg, Oral, Q6H PRN **OR** ondansetron (ZOFRAN) injection 4 mg, 4 mg, Intravenous, Q6H PRN, Cox, Amy N, DO ?  piperacillin-tazobactam (ZOSYN) IVPB 3.375 g, 3.375 g, Intravenous, Q8H, Dallie Piles, RPH, Last Rate: 12.5 mL/hr at 10/31/21 1338, 3.375 g at 10/31/21 1338 ?  traMADol (ULTRAM) tablet 50 mg, 50 mg, Oral, Q12H PRN, Cox, Amy N, DO ?  vitamin B-12 (CYANOCOBALAMIN) tablet 500 mcg, 500 mcg, Oral, Daily, Cox, Amy N, DO, 500 mcg at 10/31/21 4270 ? ?Family History  ?Problem Relation Age of Onset  ? Arthritis Mother   ? Hypertension Mother   ? Osteoporosis Mother   ? COPD Father   ? Arthritis Maternal Grandmother   ? Heart disease Maternal Grandfather   ? Colon cancer Neg Hx   ? Breast cancer Neg Hx   ?  ? ?Social History  ? ?Tobacco  Use  ? Smoking status: Former  ?  Packs/day: 1.00  ?  Years: 20.00  ?  Pack years: 20.00  ?  Types: Cigarettes  ?  Quit date: 08/11/1973  ?  Years since quitting: 48.2  ? Smokeless tobacco: Never  ?Substance Use Topics  ? Alcohol use: Yes  ?  Alcohol/week: 2.0 standard drinks  ?  Types: 2 Glasses of wine per week  ? Drug use: No  ? ? ?Allergies as of 10/30/2021 - Review Complete 10/30/2021  ?Allergen Reaction Noted  ? Alendronate  01/02/2016  ? Amlodipine besylate Other (See Comments) 09/07/2015  ? Ciprofloxacin Hives 06/02/2013  ? Codeine Itching 06/02/2013  ? Lovastatin  04/28/2016  ? Septra [sulfamethoxazole-trimethoprim] Itching 06/02/2013  ? ? ?  Review of Systems:    ?All systems reviewed and negative except where noted in HPI. ? ? Physical Exam:  ?Vital signs in last 24 hours: ?Temp:  [98 ?F (36.7 ?C)-98.9 ?F (37.2 ?C)] 98 ?F (36.7 ?C) (03/23 0757) ?Pulse Rate:  [78-85] 81 (03/23 0757) ?Resp:  [17-20] 18 (03/23 0757) ?BP: (145-172)/(73-91) 155/81 (03/23 0757) ?SpO2:  [91 %-98 %] 91 % (03/23 0757) ?Last BM Date : 10/31/21 ?General:   Pleasant, cooperative in NAD ?Head:  Normocephalic and atraumatic. ?Eyes:   No icterus.   Conjunctiva pink. PERRLA. ?Ears:  Normal auditory acuity. ?Neck:  Supple; no masses or thyroidomegaly ?Lungs: Respirations even and unlabored. Lungs clear to auscultation bilaterally.   No wheezes, crackles, or rhonchi.  ?Heart:  Regular rate and rhythm;  Without murmur, clicks, rubs or gallops ?Abdomen:  Soft, mildly distended, nontender. Normal bowel sounds. No appreciable masses or hepatomegaly.  No rebound or guarding.  ?Rectal:  Not performed. ?Msk:  Symmetrical without gross deformities.  Strength generalized weakness ?Extremities:  Without edema, cyanosis or clubbing. ?Neurologic:  Alert and oriented x3;  grossly normal neurologically. ?Skin:  Intact without significant lesions or rashes. ?Cervical Nodes:  No significant cervical adenopathy. ?Psych:  Alert and cooperative. Normal  affect. ? ?LAB RESULTS: ? ?  Latest Ref Rng & Units 10/31/2021  ?  5:14 AM 10/30/2021  ?  5:30 PM 10/26/2021  ? 10:36 AM  ?CBC  ?WBC 4.0 - 10.5 K/uL 19.1   22.6   18.2    ?Hemoglobin 12.0 - 15.0 g/dL 11.7   13.2   14

## 2021-10-31 NOTE — Progress Notes (Signed)
PHARMACY NOTE:  ANTIMICROBIAL RENAL DOSAGE ADJUSTMENT ? ?Current antimicrobial regimen includes a mismatch between antimicrobial dosage and estimated renal function.  As per policy approved by the Pharmacy & Therapeutics and Medical Executive Committees, the antimicrobial dosage will be adjusted accordingly. ? ?Current antimicrobial dosage:  Zosyn 2.25 grams IV every 8 hours ? ?Indication: colitis ? ?Renal Function: ? ?Estimated Creatinine Clearance: 14.4 mL/min (A) (by C-G formula based on SCr of 2.66 mg/dL (H)). ?'[]'$      On intermittent HD, scheduled: ?'[]'$      On CRRT ?   ?Antimicrobial dosage has been changed to:  Zosyn 3.375 grams (EI) IV every 8 hours (based on CHG Antimicrobial Guidelines  CrCl 10 - 30 mL/min) ? ? ?Thank you for allowing pharmacy to be a part of this patient's care. ? ?Dallie Piles, [facility] ?10/31/2021 7:34 AM ? ? ?  ? ?

## 2021-10-31 NOTE — Progress Notes (Signed)
?  Progress Note ? ? ?Patient: Marilyn Sharp UDJ:[id number] DOB: March 06, 1936 DOA: 10/30/2021     0 ?DOS: the patient was seen and examined on 10/31/2021 ?  ?Brief hospital course: ?[name] is an 86 year old female with with history of depression, anxiety, hyperlipidemia, hypertension, CKD 3B, GERD, MGUS, who presents emergency department for chief concerns of persistent colitis. ? ?Initial vitals in the emergency department show temperature of 98.8, respiration rate of 16, heart rate 81, blood pressure 149/87, SPO2 of 96% on room air. ? ?Serum sodium 136, potassium 3.6, chloride 99, bicarb 26, BUN 59, serum creatinine of 2.60, nonfasting blood glucose 118, GFR 18, WBC 22.6, hemoglobin 13.2, platelets of 512. ? ?UA was negative for leukocytes and nitrates. ? ?ED treatment: IV Zosyn 3.375 mg one-time dose, sodium chloride 500 mL bolus. ? ?Assessment and Plan: ?* AKI (acute kidney injury) ([place]) ?- Presumed secondary to contrast nephropathy in setting of recent CT studies involving contrast ?-Cont to avoid nephrotoxic agents ?-cont IVF hydration ?-Recheck bmet in AM ?-Renal US has been performed, pending report ? ?Leukocytosis ?-Likely secondary to above colitis ?-WBC trending down ?- CBC in the a.m. ? ?Enterotoxigenic Escherichia coli infection ?- GI and C. difficile PCR panel reviewed. CDiff is neg. Stool study is pos for ETEC ?-Given hx of recurrent colitis, GI was consulted at time of presentation ?-Had been continued on empiric zosyn ? ?Acute kidney failure, unspecified ([place]) ?Renal US performed, pending results ?Pt is s/p recent CT studies involving contrast  ?CT abd/pelvis 3/22 reviewed. Findings suggestive of ATN vs contrast nephropathy ?Will cont to avoid nephrotoxic agents. Cont IVF hydration ?Repeat bmet in AM ? ?Benign essential HTN ?- Resumed hydralazine 25 mg p.o. twice daily ?- Hydralazine 10 mg p.o. every 6 hours as needed for SBP greater than 175, 4 doses ordered ? ?HTN (hypertension) ?Currently  stable, albeit suboptimally controlled ?Continued on hydralazine ? ? ? ? ?  ? ?Subjective: Denies chest pains ? ?Physical Exam: ?Vitals:  ? 10/30/21 2000 10/30/21 2106 10/31/21 0425 10/31/21 0757  ?BP: (!) 165/86 (!) 172/89 (!) 145/73 (!) 155/81  ?Pulse: 83 83 78 81  ?Resp: '18 20 20 18  '$ ?Temp:  98.9 ?F (37.2 ?C) 98.7 ?F (37.1 ?C) 98 ?F (36.7 ?C)  ?TempSrc:  Oral Oral   ?SpO2: 93% 93% 93% 91%  ? ?General exam: Conversant, in no acute distress ?Respiratory system: normal chest rise, clear, no audible wheezing ?Cardiovascular system: regular rhythm, s1-s2 ?Gastrointestinal system: Nondistended, nontender, pos BS ?Central nervous system: No seizures, no tremors ?Extremities: No cyanosis, no joint deformities ?Skin: No rashes, no pallor ?Psychiatry: Affect normal // no auditory hallucinations  ? ?Data Reviewed: ? ?Labs reviewed, Cr 2.66, stool pos for ETEC ? ?Family Communication: Pt in room ? ?Disposition: ?Status is: Observation ?The patient will require care spanning > 2 midnights and should be moved to inpatient because: Severity of illness needing IVF ? Planned Discharge Destination: Home ? ? ? ? ?Author: ?Marylu Lund, MD ?10/31/2021 1:02 PM ? ?For on call review [email or web address].  ?

## 2021-10-31 NOTE — Assessment & Plan Note (Addendum)
Renal ultrasound notes no hydronephrosis.  Patient with history of stage IIIa chronic kidney disease.  Initially present with creatinine 2.66 and GFR 17.  With fluids and resumption of p.o., creatinine looks be at baseline, currently 1.06 with GFR 52 ?

## 2021-10-31 NOTE — Progress Notes (Signed)
Initial Nutrition Assessment ? ?DOCUMENTATION CODES:  ? ?Non-severe (moderate) malnutrition in context of social or environmental circumstances ? ?INTERVENTION:  ? ?Ensure Enlive po BID with diet advancement, each supplement provides 350 kcal and 20 grams of protein. ? ?MVI po daily  ? ?Pt at high refeed risk; recommend monitor potassium, magnesium and phosphorus labs daily until stable ? ?NUTRITION DIAGNOSIS:  ? ?Moderate Malnutrition related to social / environmental circumstances (advanced age, poor oral intake) as evidenced by moderate fat depletion, moderate muscle depletion ? ?GOAL:  ? ?Patient will meet greater than or equal to 90% of their needs ? ?MONITOR:  ? ?Labs, Weight trends, Skin, I & O's, Diet advancement ? ?REASON FOR ASSESSMENT:  ? ?Malnutrition Screening Tool ?  ? ?ASSESSMENT:  ? ?86 y/o female with h/o MDD, anxiety, HLD, HTN, CKD III, MGUS and GERD who is admitted with colitis. ? ?Met with pt in room today. Pt reports fair appetite and oral intake at baseline; pt reports that she generally eats three light meals per day at home. Pt also reports that she drinks chocolate and vanilla Boost and Ensure at home. Pt reports a 10lb weight loss since Labor Day. Per chart, pt is down 5lbs(4%) since September; this is not significant. Pt currently NPO pending GI consult. Pt is hungry today and asking for food as she reports that she has not eaten anything since the day before yesterday. RD will add supplements once her diet is advanced. Pt is likely at refeed risk.  ? ?Medications reviewed and include: heparin, B12, NaCl @125ml /hr, zosyn  ? ?Labs reviewed: K 3.3(L), BUN 62(H), creat 2.66(H) ?Wbc- 19.1(H) ? ?NUTRITION - FOCUSED PHYSICAL EXAM: ? ?Flowsheet Row Most Recent Value  ?Orbital Region Moderate depletion  ?Upper Arm Region Moderate depletion  ?Thoracic and Lumbar Region Moderate depletion  ?Buccal Region Moderate depletion  ?Temple Region Moderate depletion  ?Clavicle Bone Region Severe depletion   ?Clavicle and Acromion Bone Region Severe depletion  ?Scapular Bone Region Moderate depletion  ?Dorsal Hand Severe depletion  ?Patellar Region Moderate depletion  ?Anterior Thigh Region Moderate depletion  ?Posterior Calf Region Moderate depletion  ?Edema (RD Assessment) None  ?Hair Reviewed  ?Eyes Reviewed  ?Mouth Reviewed  ?Skin Reviewed  ?Nails Reviewed  ? ?Diet Order:   ?Diet Order   ? ?       ?  Diet NPO time specified Except for: Sips with Meds, Ice Chips  Diet effective now       ?  ? ?  ?  ? ?  ? ?EDUCATION NEEDS:  ? ?Education needs have been addressed ? ?Skin:  Skin Assessment: Reviewed RN Assessment ? ?Last BM:  3/23- type 4 ? ?Height:  ? ?Ht Readings from Last 1 Encounters:  ?10/26/21 5' 5"  (1.651 m)  ? ? ?Weight:  ? ?Wt Readings from Last 1 Encounters:  ?10/26/21 61.2 kg  ? ? ?Ideal Body Weight:  56.8 kg ? ?Estimated Nutritional Needs:  ? ?Kcal:  1400-1600kcal/day ? ?Protein:  70-80g/day ? ?Fluid:  1.4-1.6L/day ? ?Koleen Distance MS, RD, LDN ?Please refer to [facility] for RD and/or RD on-call/weekend/after hours pager ? ?

## 2021-10-31 NOTE — Assessment & Plan Note (Addendum)
Initially felt to be secondary to colitis.  Despite treatment with Zosyn, white blood cell count remained elevated.  On 4/5, up to 18.0.  Patient is afebrile and procalcitonin level completely normal, less than 10.  Not felt to be infection related.  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  Recommendation is for repeat CBC on 4/8. ?

## 2021-11-01 ENCOUNTER — Telehealth: Payer: Self-pay | Admitting: *Deleted

## 2021-11-01 DIAGNOSIS — N179 Acute kidney failure, unspecified: Secondary | ICD-10-CM | POA: Diagnosis not present

## 2021-11-01 DIAGNOSIS — K529 Noninfective gastroenteritis and colitis, unspecified: Secondary | ICD-10-CM | POA: Diagnosis not present

## 2021-11-01 DIAGNOSIS — I10 Essential (primary) hypertension: Secondary | ICD-10-CM | POA: Diagnosis not present

## 2021-11-01 LAB — COMPREHENSIVE METABOLIC PANEL
ALT: 10 U/L (ref 0–44)
AST: 11 U/L — ABNORMAL LOW (ref 15–41)
Albumin: 2.7 g/dL — ABNORMAL LOW (ref 3.5–5.0)
Alkaline Phosphatase: 49 U/L (ref 38–126)
Anion gap: 10 (ref 5–15)
BUN: 50 mg/dL — ABNORMAL HIGH (ref 8–23)
CO2: 24 mmol/L (ref 22–32)
Calcium: 7.9 mg/dL — ABNORMAL LOW (ref 8.9–10.3)
Chloride: 107 mmol/L (ref 98–111)
Creatinine, Ser: 2.4 mg/dL — ABNORMAL HIGH (ref 0.44–1.00)
GFR, Estimated: 20 mL/min — ABNORMAL LOW (ref >60)
Glucose, Bld: 98 mg/dL (ref 70–99)
Potassium: 3.1 mmol/L — ABNORMAL LOW (ref 3.5–5.1)
Sodium: 141 mmol/L (ref 135–145)
Total Bilirubin: 1.1 mg/dL (ref 0.3–1.2)
Total Protein: 5.5 g/dL — ABNORMAL LOW (ref 6.5–8.1)

## 2021-11-01 LAB — CBC
HCT: 38.8 % (ref 36.0–46.0)
Hemoglobin: 12.4 g/dL (ref 12.0–15.0)
MCH: 28.3 pg (ref 26.0–34.0)
MCHC: 32 g/dL (ref 30.0–36.0)
MCV: 88.6 fL (ref 80.0–100.0)
Platelets: 540 10*3/uL — ABNORMAL HIGH (ref 150–400)
RBC: 4.38 MIL/uL (ref 3.87–5.11)
RDW: 15 % (ref 11.5–15.5)
WBC: 18.5 10*3/uL — ABNORMAL HIGH (ref 4.0–10.5)
nRBC: 0 % (ref 0.0–0.2)

## 2021-11-01 LAB — HEMOGLOBIN AND HEMATOCRIT, BLOOD
HCT: 41.3 % (ref 36.0–46.0)
Hemoglobin: 13.1 g/dL (ref 12.0–15.0)

## 2021-11-01 MED ORDER — POTASSIUM CHLORIDE CRYS ER 20 MEQ PO TBCR: 60.0000 meq | EXTENDED_RELEASE_TABLET | ORAL | Status: DC

## 2021-11-01 MED ORDER — POLYETHYLENE GLYCOL 3350 17 GM/SCOOP PO POWD
1.0000 | Freq: Once | ORAL | Status: AC
  Administered 2021-11-02: 255 g via ORAL
  Filled 2021-11-01: qty 255

## 2021-11-01 MED ORDER — POTASSIUM CHLORIDE CRYS ER 20 MEQ PO TBCR
40.0000 meq | EXTENDED_RELEASE_TABLET | ORAL | Status: AC
  Administered 2021-11-01 (×2): 40 meq via ORAL
  Filled 2021-11-01 (×2): qty 2

## 2021-11-01 MED ORDER — SODIUM CHLORIDE 0.9 % IV SOLN: INTRAVENOUS | Status: DC

## 2021-11-01 NOTE — Progress Notes (Signed)
Patient refusing to go without purewick because she states she doesn't always know when she has to go and doesn't get to bedside commode fast enough. She was very adamant. Will continue to monitor. ?

## 2021-11-01 NOTE — TOC Progression Note (Signed)
Transition of Care (TOC) - Progression Note  ? ? ?Patient Details  ?Name: Marilyn Sharp ?MRN: [id number] ?Date of Birth: Mar 14, 1936 ? ?Transition of Care (TOC) CM/SW Contact  ?Beverly Sessions, RN ?Phone Number: ?11/01/2021, 2:17 PM ? ?Clinical Narrative:    ? ?Patient declines to ambulate with mobility tech ?Requested PT eval from MD ?Plan for colonoscopy Sunday ? ?Expected Discharge Plan: Home/Self Care ?Barriers to Discharge: Continued Medical Work up ? ?Expected Discharge Plan and Services ?Expected Discharge Plan: Home/Self Care ?  ?  ?  ?Living arrangements for the past 2 months: [place] ?                ?  ?  ?  ?  ?  ?  ?  ?  ?  ?  ? ? ?Social Determinants of Health (SDOH) Interventions ?  ? ?Readmission Risk Interventions ?   ? View : No data to display.  ?  ?  ?  ? ? ?

## 2021-11-01 NOTE — Progress Notes (Signed)
Mobility Specialist - Progress Note ? ? 11/01/21 1400  ?Mobility  ?Range of Motion/Exercises Right leg;Left leg  ?Level of Assistance Modified independent, requires aide device or extra time  ?Assistive Device None  ?Distance Ambulated (ft) 0 ft  ?Activity Response Tolerated well  ?$Mobility charge 1 Mobility  ? ? ? ?Pt supine upon arrival using RA with family at bedside. Pt complains of feeling weak d/t dietary restricts and refuses ambulation at this time. Completes BLE Therex with ModI -- Left LE weakness noted. Pt voices no other complaints and is left with needs in reach and family at bedside. ? ?Marilyn Sharp ?Mobility Specialist ?11/01/21, 2:05 PM ? ? ? ?

## 2021-11-01 NOTE — Progress Notes (Signed)
? ? ?Marilyn Darby, MD ?[address]  ?[address]  ?[place], [place] [address]  ?Main: [phone]  ?Fax: [phone] ?Pager: [phone] ? ? ?Subjective: ?No acute events overnight, patient's friend is bedside as well as PT.  Patient is undergoing PT.  Patient reports having ongoing diarrhea, denies any abdominal pain or distention. ? ? ?Objective: ?Vital signs in last 24 hours: ?Vitals:  ? 10/31/21 1559 10/31/21 1944 11/01/21 0259 11/01/21 0759  ?BP:  (!) 145/74 (!) 150/80 (!) 166/85  ?Pulse:  77 81 85  ?Resp:  '16 18 18  '$ ?Temp:  97.8 ?F (36.6 ?C) 99 ?F (37.2 ?C) 98.9 ?F (37.2 ?C)  ?TempSrc:    Oral  ?SpO2: 95% 93% 92% 94%  ? ?Weight change:  ? ?Intake/Output Summary (Last 24 hours) at 11/01/2021 1218 ?Last data filed at 11/01/2021 0345 ?Gross per 24 hour  ?Intake 1946.4 ml  ?Output --  ?Net 1946.4 ml  ? ? ? ?Exam: ?Heart:: Regular rate and rhythm, S1S2 present, or without murmur or extra heart sounds ?Lungs: normal and clear to auscultation ?Abdomen: Soft, moderately distended, tympanic to percussion, nontender ? ? ?Lab Results: ? ?  Latest Ref Rng & Units 11/01/2021  ?  4:52 AM 10/31/2021  ?  5:14 AM 10/30/2021  ?  5:30 PM  ?CBC  ?WBC 4.0 - 10.5 K/uL 18.5   19.1   22.6    ?Hemoglobin 12.0 - 15.0 g/dL 12.4   11.7   13.2    ?Hematocrit 36.0 - 46.0 % 38.8   36.4   42.1    ?Platelets 150 - 400 K/uL 540   485   512    ? ? ?  Latest Ref Rng & Units 11/01/2021  ?  4:52 AM 10/31/2021  ?  5:14 AM 10/30/2021  ?  3:24 PM  ?CMP  ?Glucose 70 - 99 mg/dL 98   91   118    ?BUN 8 - 23 mg/dL 50   62   59    ?Creatinine 0.44 - 1.00 mg/dL 2.40   2.66   2.60    ?Sodium 135 - 145 mmol/L 141   140   136    ?Potassium 3.5 - 5.1 mmol/L 3.1   3.3   3.6    ?Chloride 98 - 111 mmol/L 107   105   99    ?CO2 22 - 32 mmol/L '24   27   26    '$ ?Calcium 8.9 - 10.3 mg/dL 7.9   8.4   9.0    ?Total Protein 6.5 - 8.1 g/dL 5.5    6.6    ?Total Bilirubin 0.3 - 1.2 mg/dL 1.1    0.9    ?Alkaline Phos 38 - 126 U/L 49    55    ?AST 15 - 41 U/L 11    14     ?ALT 0 - 44 U/L 10    11    ? ? ?Micro Results: ?Recent Results (from the past 240 hour(s))  ?Gastrointestinal Panel by PCR , Stool     Status: Abnormal  ? Collection Time: 10/31/21  8:20 AM  ? Specimen: Stool  ?Result Value Ref Range Status  ? Campylobacter species NOT DETECTED NOT DETECTED Final  ? Plesimonas shigelloides NOT DETECTED NOT DETECTED Final  ? Salmonella species NOT DETECTED NOT DETECTED Final  ? Yersinia enterocolitica NOT DETECTED NOT DETECTED Final  ? Vibrio species NOT DETECTED NOT DETECTED Final  ? Vibrio cholerae NOT DETECTED NOT DETECTED  Final  ? Enteroaggregative E coli (EAEC) NOT DETECTED NOT DETECTED Final  ? Enteropathogenic E coli (EPEC) NOT DETECTED NOT DETECTED Final  ? Enterotoxigenic E coli (ETEC) DETECTED (A) NOT DETECTED Final  ?  Comment: RESULT CALLED TO, READ BACK BY AND VERIFIED WITH: ?JASMYNE DAVIS 10/31/21 1012 MW ?  ? Shiga like toxin producing E coli (STEC) NOT DETECTED NOT DETECTED Final  ? Shigella/Enteroinvasive E coli (EIEC) NOT DETECTED NOT DETECTED Final  ? Cryptosporidium NOT DETECTED NOT DETECTED Final  ? Cyclospora cayetanensis NOT DETECTED NOT DETECTED Final  ? Entamoeba histolytica NOT DETECTED NOT DETECTED Final  ? Giardia lamblia NOT DETECTED NOT DETECTED Final  ? Adenovirus F40/41 NOT DETECTED NOT DETECTED Final  ? Astrovirus NOT DETECTED NOT DETECTED Final  ? Norovirus GI/GII NOT DETECTED NOT DETECTED Final  ? Rotavirus A NOT DETECTED NOT DETECTED Final  ? Sapovirus (I, II, IV, and V) NOT DETECTED NOT DETECTED Final  ?  Comment: Performed at [facility], [address], [place], [place] [address]  ?C Difficile Quick Screen w PCR reflex     Status: None  ? Collection Time: 10/31/21  8:25 AM  ? Specimen: Stool  ?Result Value Ref Range Status  ? C Diff antigen NEGATIVE NEGATIVE Final  ? C Diff toxin NEGATIVE NEGATIVE Final  ? C Diff interpretation No C. difficile detected.  Final  ?  Comment: Performed at [facility], [address]  [place], [place] [address]  ? ?Studies/Results: ?CT ABDOMEN PELVIS WO CONTRAST ? ?Result Date: 10/30/2021 ?CLINICAL DATA:  Abdominal pain EXAM: CT ABDOMEN AND PELVIS WITHOUT CONTRAST TECHNIQUE: Multidetector CT imaging of the abdomen and pelvis was performed following the standard protocol without IV contrast. RADIATION DOSE REDUCTION: This exam was performed according to the departmental dose-optimization program which includes automated exposure control, adjustment of the mA and/or kV according to patient size and/or use of iterative reconstruction technique. COMPARISON:  CT done on 10/26/2021 FINDINGS: Lower chest: Heart is enlarged in size. Small pericardial effusion is present. Hepatobiliary: No focal abnormality is seen in the liver. There are calcified gallbladder stone. There is no significant dilation of bile ducts. Pancreas: No focal abnormality is seen. Spleen: Unremarkable. Adrenals/Urinary Tract: Adrenals are unremarkable. There is dense patchy delayed nephrogram in both kidneys. Findings suggest possible acute tubular necrosis or systemic hypotension or contrast nephropathy. Other less likely possibilities would include bilateral renal vein thrombosis or bilateral renal artery stenosis. There is no hydronephrosis. There is small amount of contrast in the pelvocaliceal systems in both kidneys. There is residual contrast in the urinary bladder. Stomach/Bowel: Stomach is unremarkable. Small bowel loops are not dilated. Appendix is not dilated. There is abnormal wall thickening in the descending and sigmoid colon. Findings may suggest inflammatory or ischemic or infectious colitis. There is no loculated pericolic fluid collection. Scattered diverticula are seen in the colon without signs of focal acute diverticulitis. There is interval progression of signs of colitis in the sigmoid colon. Vascular/Lymphatic: There are scattered coarse calcifications in the abdominal aorta and its major branches. Reproductive:  Unremarkable. Other: There is no ascites or pneumoperitoneum. Musculoskeletal: Compression fracture and vertebroplasty in the body of T12 vertebra has not changed. IMPRESSION: Extensive patchy delayed dense nephrogram is seen in both kidneys. This may suggest acute tubular necrosis or systemic hypotension or contrast nephropathy. There is no hydronephrosis. There is abnormal wall thickening in the descending and sigmoid colon interval progression in the sigmoid colon suggesting inflammatory or ischemic or infectious colitis. Scattered  diverticula are seen in colon without signs of focal diverticulitis. There is no evidence of intestinal obstruction or pneumoperitoneum. Gallbladder stone.  Small pericardial effusion. Other findings as described in the body of the report. Electronically Signed   By: Elmer Picker M.D.   On: 10/30/2021 17:42  ? ?US RENAL ? ?Result Date: 10/31/2021 ?CLINICAL DATA:  Acute kidney injury. EXAM: RENAL / URINARY TRACT ULTRASOUND COMPLETE COMPARISON:  June 03, 2019. FINDINGS: Right Kidney: Renal measurements: 10.4 x 5.0 x 4.8 cm = volume: 132 mL. 2.6 cm simple cyst is noted medially. Echogenicity within normal limits. No mass or hydronephrosis visualized. Left Kidney: Renal measurements: 9.6 x 5.5 x 3.9 cm = volume: 110 mL. Possible 5 mm nonobstructive calculus is noted in midpole. Echogenicity within normal limits. No mass or hydronephrosis visualized. Bladder: Appears normal for degree of bladder distention. Other: None. IMPRESSION: Possible nonobstructive left renal calculus. Small simple right renal cyst. No hydronephrosis or renal obstruction is noted. Electronically Signed   By: Marijo Conception M.D.   On: 10/31/2021 11:01   ?Medications: I have reviewed the patient's current medications. ?Prior to Admission:  ?Medications Prior to Admission  ?Medication Sig Dispense Refill Last Dose  ? torsemide (DEMADEX) 20 MG tablet Take 20 mg by mouth daily.     ? acetaminophen (TYLENOL) 325  MG tablet Take 650 mg by mouth every 6 (six) hours as needed.     ? amoxicillin-clavulanate (AUGMENTIN) 875-125 MG tablet Take 1 tablet by mouth 2 (two) times daily for 7 days. 14 tablet 0   ? Cholecalciferol

## 2021-11-01 NOTE — Evaluation (Signed)
Physical Therapy Evaluation ?Patient Details ?Name: Marilyn Sharp ?MRN: [id number] ?DOB: 12/12/1935 ?Today's Date: 11/01/2021 ? ?History of Present Illness ? Marilyn Sharp is an 85 year old female with with history of depression, anxiety, hyperlipidemia, hypertension, CKD 3B, GERD, MGUS, who presents emergency department for chief concerns of persistent colitis. ? ?  ?Clinical Impression ? Pt received in Semi-Fowler's position and agreeable to therapy.  Pt able to participate in bed-level exercises without much difficulty.  Pt then performed transfers to sitting EOB.  Pt self-reports saturated briefs, agreeing to changing once back in room from ambulation bout.  Pt required 3 standing rest breaks to rest, with the last occurring approximately 60 ft from her room and pt noting to be slightly "swimmy-headed".  Sensation lasted <2 minutes, and pt continued with ambulating back to her bed.  Nursing in room to assist with bedding and linens change.  Pt left with nurse sitting EOB with all needs met.  With continued therapy, pt will benefit from PT intervention to increase independence and safety with basic mobility in preparation for discharge to the venue listed below.   ?   ?   ? ?Recommendations for follow up therapy are one component of a multi-disciplinary discharge planning process, led by the attending physician.  Recommendations may be updated based on patient status, additional functional criteria and insurance authorization. ? ?Follow Up Recommendations Home health PT ? ?  ?Assistance Recommended at Discharge PRN  ?Patient can return home with the following ?   ? ?  ?Equipment Recommendations Rolling walker (2 wheels)  ?Recommendations for Other Services ?    ?  ?Functional Status Assessment Patient has had a recent decline in their functional status and demonstrates the ability to make significant improvements in function in a reasonable and predictable amount of time.  ? ?  ?Precautions / Restrictions    ? ?   ? ?Mobility ? Bed Mobility ?Overal bed mobility: Modified Independent ?  ?  ?  ?  ?  ?  ?General bed mobility comments: extra time and verbal cues necessary for hand placement on bed rails. ?  ? ?Transfers ?Overall transfer level: Modified independent ?  ?  ?  ?  ?  ?  ?  ?  ?General transfer comment: extra time given for transfers ?  ? ?Ambulation/Gait ?Ambulation/Gait assistance: Min guard ?Gait Distance (Feet): 180 Feet ?Assistive device: Rolling walker (2 wheels) ?Gait Pattern/deviations: Step-through pattern ?Gait velocity: decreased ?  ?  ?General Gait Details: pt with slow but steady gait. ? ?Stairs ?  ?  ?  ?  ?  ? ?Wheelchair Mobility ?  ? ?Modified Rankin (Stroke Patients Only) ?  ? ?  ? ?Balance Overall balance assessment: Needs assistance ?Sitting-balance support: No upper extremity supported, Feet supported ?Sitting balance-Leahy Scale: Good ?  ?  ?Standing balance support: Bilateral upper extremity supported, During functional activity ?Standing balance-Leahy Scale: Fair ?  ?  ?  ?  ?  ?  ?  ?  ?  ?  ?  ?  ?   ? ? ? ?Pertinent Vitals/Pain Pain Assessment ?Pain Assessment: No/denies pain  ? ? ?Home Living Family/patient expects to be discharged to:: Private residence ?Living Arrangements: Alone ?Available Help at Discharge: Family;Friend(s);Available 24 hours/day ?Type of Home: House ?Home Access: Level entry ?  ?  ?  ?Home Layout: One level ?Home Equipment: Rollator (4 wheels);Shower seat;Hand held shower head (Pt normally ambulates well without use of AD, but uses rollator if   walkign longer distances so she can sit down and rest.) ?   ?  ?Prior Function Prior Level of Function : Independent/Modified Independent ?  ?  ?  ?  ?  ?  ?  ?  ?  ? ? ?Hand Dominance  ? Dominant Hand: Right ? ?  ?Extremity/Trunk Assessment  ? Upper Extremity Assessment ?Upper Extremity Assessment: Generalized weakness ?  ? ?Lower Extremity Assessment ?Lower Extremity Assessment: Generalized weakness ?  ? ?   ?Communication  ?  Communication: No difficulties  ?Cognition Arousal/Alertness: Awake/alert ?Behavior During Therapy: WFL for tasks assessed/performed ?Overall Cognitive Status: Within Functional Limits for tasks assessed ?  ?  ?  ?  ?  ?  ?  ?  ?  ?  ?  ?  ?  ?  ?  ?  ?General Comments: A&O x4 ?  ?  ? ?  ?General Comments   ? ?  ?Exercises Total Joint Exercises ?Ankle Circles/Pumps: AROM, Strengthening, Both, 10 reps, Supine ?Quad Sets: AROM, Strengthening, Both, 10 reps, Supine ?Gluteal Sets: AROM, Strengthening, Both, 10 reps, Supine ?Hip ABduction/ADduction: AROM, Strengthening, Both, 10 reps, Supine ?Straight Leg Raises: AROM, Strengthening, Both, 10 reps, Supine ?Long Arc Quad: AROM, Strengthening, Both, 10 reps, Supine ?Marching in Standing: AROM, Strengthening, Both, 10 reps, Standing  ? ?Assessment/Plan  ?  ?PT Assessment Patient needs continued PT services  ?PT Problem List Decreased strength;Decreased range of motion;Decreased activity tolerance;Decreased balance;Decreased mobility;Decreased knowledge of use of DME ? ?   ?  ?PT Treatment Interventions DME instruction;Gait training;Functional mobility training;Therapeutic activities;Therapeutic exercise;Balance training;Neuromuscular re-education   ? ?PT Goals (Current goals can be found in the Care Plan section)  ?Acute Rehab PT Goals ?Patient Stated Goal: to go home. ?PT Goal Formulation: With patient ?Time For Goal Achievement: 11/15/21 ?Potential to Achieve Goals: Good ? ?  ?Frequency Min 2X/week ?  ? ? ?Co-evaluation   ?  ?  ?  ?  ? ? ?  ?AM-PAC PT "6 Clicks" Mobility  ?Outcome Measure Help needed turning from your back to your side while in a flat bed without using bedrails?: A Little ?Help needed moving from lying on your back to sitting on the side of a flat bed without using bedrails?: A Little ?Help needed moving to and from a bed to a chair (including a wheelchair)?: A Little ?Help needed standing up from a chair using your arms (e.g., wheelchair or bedside  chair)?: A Little ?Help needed to walk in hospital room?: A Little ?Help needed climbing 3-5 steps with a railing? : A Little ?6 Click Score: 18 ? ?  ?End of Session Equipment Utilized During Treatment: Gait belt ?Activity Tolerance: Patient tolerated treatment well;Patient limited by fatigue ?Patient left: in bed;with call bell/phone within reach;with bed alarm set;with nursing/sitter in room ?Nurse Communication: Mobility status ?PT Visit Diagnosis: Unsteadiness on feet (R26.81);Other abnormalities of gait and mobility (R26.89);Repeated falls (R29.6);Muscle weakness (generalized) (M62.81);History of falling (Z91.81);Difficulty in walking, not elsewhere classified (R26.2) ?  ? ?Time: 1555-1634 ?PT Time Calculation (min) (ACUTE ONLY): 39 min ? ? ?Charges:   PT Evaluation ?$PT Eval Low Complexity: 1 Low ?PT Treatments ?$Gait Training: 8-22 mins ?$Therapeutic Exercise: 8-22 mins ?  ?   ? ? ?Joshua Robbins, PT, DPT ?11/01/21, 5:21 PM ? ? ?Joshua W Robbins ?11/01/2021, 5:18 PM ? ?

## 2021-11-01 NOTE — Progress Notes (Signed)
?  Progress Note ? ? ?Patient: Marilyn Sharp HWT:[id number] DOB: August 04, 1936 DOA: 10/30/2021     1 ?DOS: the patient was seen and examined on 11/01/2021 ?  ?Brief hospital course: ?[name] is an 86 year old female with with history of depression, anxiety, hyperlipidemia, hypertension, CKD 3B, GERD, MGUS, who presents emergency department for chief concerns of persistent colitis. ? ?Initial vitals in the emergency department show temperature of 98.8, respiration rate of 16, heart rate 81, blood pressure 149/87, SPO2 of 96% on room air. ? ?Serum sodium 136, potassium 3.6, chloride 99, bicarb 26, BUN 59, serum creatinine of 2.60, nonfasting blood glucose 118, GFR 18, WBC 22.6, hemoglobin 13.2, platelets of 512. ? ?UA was negative for leukocytes and nitrates. ? ?ED treatment: IV Zosyn 3.375 mg one-time dose, sodium chloride 500 mL bolus. ? ?Assessment and Plan: ?* AKI (acute kidney injury) ([place]) ?- Presumed secondary to contrast nephropathy in setting of recent CT studies involving contrast ?-Cont per above ? ?Leukocytosis ?-Likely secondary to above colitis ?-WBC trending down ?- CBC in the a.m. ? ?Enterotoxigenic Escherichia coli infection ?- GI and C. difficile PCR panel reviewed. CDiff is neg. Stool study is pos for ETEC ?-Given hx of recurrent colitis, GI was consulted at time of presentation ?-Continued on empiric zosyn ?-GI following. Given recurrent colitis, plan for colonoscopy 3/26 ? ?Acute kidney failure, unspecified ([place]) ?Renal US performed, reviewed. No hydronephrosis. Possible non-obstructive calculus in mid-pole ?Pt is s/p recent CT studies involving contrast  ?CT abd/pelvis 3/22 reviewed. Findings suggestive of ATN vs contrast nephropathy ?Will cont to avoid nephrotoxic agents. Cont IVF hydration ?Cr now trending down ?Repeat bmet in AM ? ?Benign essential HTN ?- Resumed hydralazine 25 mg p.o. twice daily ?- Hydralazine 10 mg p.o. every 6 hours as needed for SBP greater than 175 ? ?HTN  (hypertension) ?Currently stable, albeit suboptimally controlled ?Continued on hydralazine ?Cont PRN hydralazine as needed ? ? ? ? ?  ? ?Subjective: Reports feeling no better, but no worse today ? ?Physical Exam: ?Vitals:  ? 10/31/21 1559 10/31/21 1944 11/01/21 0259 11/01/21 0759  ?BP:  (!) 145/74 (!) 150/80 (!) 166/85  ?Pulse:  77 81 85  ?Resp:  '16 18 18  '$ ?Temp:  97.8 ?F (36.6 ?C) 99 ?F (37.2 ?C) 98.9 ?F (37.2 ?C)  ?TempSrc:    Oral  ?SpO2: 95% 93% 92% 94%  ? ?General exam: Awake, laying in bed, in nad ?Respiratory system: Normal respiratory effort, no wheezing ?Cardiovascular system: regular rate, s1, s2 ?Gastrointestinal system: Soft, nondistended, positive BS ?Central nervous system: CN2-12 grossly intact, strength intact ?Extremities: Perfused, no clubbing ?Skin: Normal skin turgor, no notable skin lesions seen ?Psychiatry: Mood normal // no visual hallucinations  ? ?Data Reviewed: ? ?Labs reviewed, Cr 2.40, potassium 3.1 ? ?Family Communication: Pt in room ? ?Disposition: ?Status is: Inpatient ?Continue inpatient stay because: Severity of illness needing IVF ? Planned Discharge Destination: Home ? ? ? ?Author: ?Marylu Lund, MD ?11/01/2021 3:13 PM ? ?For on call review [email or web address].  ?

## 2021-11-01 NOTE — Telephone Encounter (Signed)
Patient called and left a vm on scheduling line. She thought her apts were next week and she is currently in the hospital. I attempted to reach her back. I left a detailed vm on her cell phone explaining that her apts are in June and provided the time/date for the labs and md apts. ?

## 2021-11-02 DIAGNOSIS — K529 Noninfective gastroenteritis and colitis, unspecified: Secondary | ICD-10-CM

## 2021-11-02 DIAGNOSIS — N170 Acute kidney failure with tubular necrosis: Secondary | ICD-10-CM | POA: Diagnosis not present

## 2021-11-02 DIAGNOSIS — A041 Enterotoxigenic Escherichia coli infection: Secondary | ICD-10-CM

## 2021-11-02 DIAGNOSIS — N179 Acute kidney failure, unspecified: Secondary | ICD-10-CM | POA: Diagnosis not present

## 2021-11-02 LAB — COMPREHENSIVE METABOLIC PANEL
ALT: 8 U/L (ref 0–44)
AST: 11 U/L — ABNORMAL LOW (ref 15–41)
Albumin: 2.7 g/dL — ABNORMAL LOW (ref 3.5–5.0)
Alkaline Phosphatase: 44 U/L (ref 38–126)
Anion gap: 9 (ref 5–15)
BUN: 38 mg/dL — ABNORMAL HIGH (ref 8–23)
CO2: 22 mmol/L (ref 22–32)
Calcium: 7.9 mg/dL — ABNORMAL LOW (ref 8.9–10.3)
Chloride: 112 mmol/L — ABNORMAL HIGH (ref 98–111)
Creatinine, Ser: 1.93 mg/dL — ABNORMAL HIGH (ref 0.44–1.00)
GFR, Estimated: 25 mL/min — ABNORMAL LOW (ref >60)
Glucose, Bld: 98 mg/dL (ref 70–99)
Potassium: 3.6 mmol/L (ref 3.5–5.1)
Sodium: 143 mmol/L (ref 135–145)
Total Bilirubin: 1.1 mg/dL (ref 0.3–1.2)
Total Protein: 5 g/dL — ABNORMAL LOW (ref 6.5–8.1)

## 2021-11-02 LAB — CBC
HCT: 39.2 % (ref 36.0–46.0)
Hemoglobin: 12.7 g/dL (ref 12.0–15.0)
MCH: 28.7 pg (ref 26.0–34.0)
MCHC: 32.4 g/dL (ref 30.0–36.0)
MCV: 88.7 fL (ref 80.0–100.0)
Platelets: 484 10*3/uL — ABNORMAL HIGH (ref 150–400)
RBC: 4.42 MIL/uL (ref 3.87–5.11)
RDW: 15.2 % (ref 11.5–15.5)
WBC: 15.9 10*3/uL — ABNORMAL HIGH (ref 4.0–10.5)
nRBC: 0 % (ref 0.0–0.2)

## 2021-11-02 MED ORDER — HYDRALAZINE HCL 50 MG PO TABS
50.0000 mg | ORAL_TABLET | Freq: Three times a day (TID) | ORAL | Status: DC
  Administered 2021-11-02 – 2021-11-14 (×36): 50 mg via ORAL
  Filled 2021-11-02 (×36): qty 1

## 2021-11-02 MED ORDER — ACETAMINOPHEN 500 MG PO TABS
500.0000 mg | ORAL_TABLET | Freq: Four times a day (QID) | ORAL | Status: DC | PRN
  Administered 2021-11-02: 500 mg via ORAL
  Administered 2021-11-03 – 2021-11-04 (×2): 1000 mg via ORAL
  Administered 2021-11-05 – 2021-11-06 (×3): 500 mg via ORAL
  Administered 2021-11-07 – 2021-11-11 (×5): 1000 mg via ORAL
  Administered 2021-11-12: 500 mg via ORAL
  Filled 2021-11-02: qty 2
  Filled 2021-11-02: qty 1
  Filled 2021-11-02 (×4): qty 2
  Filled 2021-11-02: qty 1
  Filled 2021-11-02: qty 2
  Filled 2021-11-02 (×2): qty 1
  Filled 2021-11-02: qty 2

## 2021-11-02 MED ORDER — HYDRALAZINE HCL 50 MG PO TABS: 50.0000 mg | ORAL_TABLET | Freq: Two times a day (BID) | ORAL | Status: DC

## 2021-11-02 NOTE — Progress Notes (Signed)
? ?[name] , MD ?[address], [place], [place], [place], [address] ?[address] [place], [place], [place], [address] ?Phone: [phone]  ?Fax: [phone] ? ? ?[name] is being followed for colitis Day 2 of follow up  ? ?Subjective: ?Feels better- was on toilet bowl when I went in to see her , no abdominal pain. Still having some dark blood in stool  ? ? ?Objective: ?Vital signs in last 24 hours: ?Vitals:  ? 11/01/21 2200 11/02/21 0031 11/02/21 0445 11/02/21 0732  ?BP:  (!) 147/80 (!) 149/93 (!) 181/96  ?Pulse:  75 83 72  ?Resp:  '16 16 18  '$ ?Temp:  98.6 ?F (37 ?C) 98.4 ?F (36.9 ?C) 97.7 ?F (36.5 ?C)  ?TempSrc:  Oral Oral Oral  ?SpO2:  94% 94% 94%  ?Weight: 68.3 kg     ?Height: '5\' 5"'$  (1.651 m)     ? ?Weight change:  ? ?Intake/Output Summary (Last 24 hours) at 11/02/2021 1111 ?Last data filed at 11/02/2021 0422 ?Gross per 24 hour  ?Intake 1780.64 ml  ?Output 500 ml  ?Net 1280.64 ml  ? ? ? ?Exam: ?She was on toilet bowl when I went in to se her  ? ?Lab Results: ?'@LABTEST2'$ @ ?Micro Results: ?Recent Results (from the past 240 hour(s))  ?Gastrointestinal Panel by PCR , Stool     Status: Abnormal  ? Collection Time: 10/31/21  8:20 AM  ? Specimen: Stool  ?Result Value Ref Range Status  ? Campylobacter species NOT DETECTED NOT DETECTED Final  ? Plesimonas shigelloides NOT DETECTED NOT DETECTED Final  ? Salmonella species NOT DETECTED NOT DETECTED Final  ? Yersinia enterocolitica NOT DETECTED NOT DETECTED Final  ? Vibrio species NOT DETECTED NOT DETECTED Final  ? Vibrio cholerae NOT DETECTED NOT DETECTED Final  ? Enteroaggregative E coli (EAEC) NOT DETECTED NOT DETECTED Final  ? Enteropathogenic E coli (EPEC) NOT DETECTED NOT DETECTED Final  ? Enterotoxigenic E coli (ETEC) DETECTED (A) NOT DETECTED Final  ?  Comment: RESULT CALLED TO, READ BACK BY AND VERIFIED WITH: ?JASMYNE DAVIS 10/31/21 1012 MW ?  ? Shiga like toxin producing E coli (STEC) NOT DETECTED NOT DETECTED Final  ? Shigella/Enteroinvasive E coli  (EIEC) NOT DETECTED NOT DETECTED Final  ? Cryptosporidium NOT DETECTED NOT DETECTED Final  ? Cyclospora cayetanensis NOT DETECTED NOT DETECTED Final  ? Entamoeba histolytica NOT DETECTED NOT DETECTED Final  ? Giardia lamblia NOT DETECTED NOT DETECTED Final  ? Adenovirus F40/41 NOT DETECTED NOT DETECTED Final  ? Astrovirus NOT DETECTED NOT DETECTED Final  ? Norovirus GI/GII NOT DETECTED NOT DETECTED Final  ? Rotavirus A NOT DETECTED NOT DETECTED Final  ? Sapovirus (I, II, IV, and V) NOT DETECTED NOT DETECTED Final  ?  Comment: Performed at [facility], [address], [place], [place] [address]  ?C Difficile Quick Screen w PCR reflex     Status: None  ? Collection Time: 10/31/21  8:25 AM  ? Specimen: Stool  ?Result Value Ref Range Status  ? C Diff antigen NEGATIVE NEGATIVE Final  ? C Diff toxin NEGATIVE NEGATIVE Final  ? C Diff interpretation No C. difficile detected.  Final  ?  Comment: Performed at [facility], [address] [place], [place] [address]  ? ?Studies/Results: ?No results found. ?Medications: I have reviewed the patient's current medications. ?Scheduled Meds: ? feeding supplement  1 Container Oral TID BM  ? heparin injection (subcutaneous)  5,000 Units Subcutaneous Q8H  ? hydrALAZINE  25 mg Oral  BID  ? polyethylene glycol powder  1 Container Oral Once  ? vitamin B-12  500 mcg Oral Daily  ? ?Continuous Infusions: ? sodium chloride 75 mL/hr at 11/02/21 0454  ? sodium chloride    ? piperacillin-tazobactam (ZOSYN)  IV 3.375 g (11/02/21 0501)  ? ?PRN Meds:.acetaminophen **OR** acetaminophen, hydrALAZINE, ondansetron **OR** ondansetron (ZOFRAN) IV, traMADol ? ? ?Assessment: ?Principal Problem: ?  AKI (acute kidney injury) ([place]) ?Active Problems: ?  Major depression in remission [facility]) ?  GERD (gastroesophageal reflux disease) ?  HTN (hypertension) ?  Carotid stenosis, asymptomatic, minimal ?  Benign essential HTN ?  Acute kidney failure, unspecified ([place]) ?  Chronic kidney disease,  stage 3 unspecified ([place]) ?  Monoclonal gammopathy ?  Enterotoxigenic Escherichia coli infection ?  Leukocytosis ?  Malnutrition of moderate degree ?  Colitis ? ?Marilyn Sharp is a 86 y.o. 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  She has recurrence of left lower quadrant pain associated with bloody diarrhea.  Stool studies on admission revealed enterotoxigenic E. coli infection.  Currently on Zosyn.  Stool for C. difficile negative.Due to recurrence of symptoms and left-sided colitis, recommend colonoscopy to evaluate for underlying inflammatory bowel disease. ?Plan: ?Colonoscopy tomorrow  ? ?I will sign off.  Please call me if any further GI concerns or questions.  We would like to thank you for the opportunity to participate in the care of [name].  ? ? LOS: 2 days  ? ?[name], MD ?11/02/2021, 11:11 AM  ?

## 2021-11-02 NOTE — Progress Notes (Signed)
?  Progress Note ? ? ?Patient: Marilyn Sharp YCX:[id number] DOB: 1935-08-18 DOA: 10/30/2021     2 ?DOS: the patient was seen and examined on 11/02/2021 ?  ?Brief hospital course: ?Marilyn Sharp is an 86 year old female with with history of depression, anxiety, hyperlipidemia, hypertension, CKD 3B, GERD, MGUS, who presents emergency department for chief concerns of persistent colitis. ? ?Initial vitals in the emergency department show temperature of 98.8, respiration rate of 16, heart rate 81, blood pressure 149/87, SPO2 of 96% on room air. ? ?Serum sodium 136, potassium 3.6, chloride 99, bicarb 26, BUN 59, serum creatinine of 2.60, nonfasting blood glucose 118, GFR 18, WBC 22.6, hemoglobin 13.2, platelets of 512. ? ?UA was negative for leukocytes and nitrates. ? ?ED treatment: IV Zosyn 3.375 mg one-time dose, sodium chloride 500 mL bolus. ? ?Assessment and Plan: ?* AKI (acute kidney injury) ([place]) ?- Presumed secondary to contrast nephropathy in setting of recent CT studies involving contrast ?-Cr down to 1.93 ?-Cont on IVF ? ?Leukocytosis ?-Likely secondary to above colitis ?-WBC trending down ?- CBC in the a.m. ? ?Enterotoxigenic Escherichia coli infection ?- GI and C. difficile PCR panel reviewed. CDiff is neg. Stool study is pos for ETEC ?-Given hx of recurrent colitis, GI was consulted at time of presentation ?-Continued on empiric zosyn ?-GI following. Given recurrent colitis, plan for colonoscopy 3/26 ? ?Acute kidney failure, unspecified ([place]) ?Renal US performed, reviewed. No hydronephrosis. Possible non-obstructive calculus in mid-pole ?Pt is s/p recent CT studies involving contrast  ?CT abd/pelvis 3/22 reviewed. Findings suggestive of ATN vs contrast nephropathy ?Will cont to avoid nephrotoxic agents. Cont IVF hydration ?Cr now trending down ?Repeat bmet in AM ? ?Benign essential HTN ?- Resumed hydralazine 25 mg p.o. twice daily ?- Hydralazine 10 mg p.o. every 6 hours as needed for SBP greater than 175 ? ?HTN  (hypertension) ?Continued on hydralazine ?Suboptimally controlled BP. Increased hydralazine to '50mg'$  q8h ?Cont PRN hydralazine as needed ? ? ? ? ?  ? ?Subjective: Without complaints this AM. Still having diarrhea ? ?Physical Exam: ?Vitals:  ? 11/01/21 2200 11/02/21 0031 11/02/21 0445 11/02/21 0732  ?BP:  (!) 147/80 (!) 149/93 (!) 181/96  ?Pulse:  75 83 72  ?Resp:  '16 16 18  '$ ?Temp:  98.6 ?F (37 ?C) 98.4 ?F (36.9 ?C) 97.7 ?F (36.5 ?C)  ?TempSrc:  Oral Oral Oral  ?SpO2:  94% 94% 94%  ?Weight: 68.3 kg     ?Height: '5\' 5"'$  (1.651 m)     ? ?General exam: Conversant, in no acute distress ?Respiratory system: normal chest rise, clear, no audible wheezing ?Cardiovascular system: regular rhythm, s1-s2 ?Gastrointestinal system: Nondistended, nontender, pos BS ?Central nervous system: No seizures, no tremors ?Extremities: No cyanosis, no joint deformities ?Skin: No rashes, no pallor ?Psychiatry: Affect normal // no auditory hallucinations  ? ?Data Reviewed: ? ?Labs reviewed, Cr 1.93, potassium 3.6 ? ?Family Communication: Pt in room ? ?Disposition: ?Status is: Inpatient ?Continue inpatient stay because: Severity of illness needing IVF ? Planned Discharge Destination: Home ? ? ? ?Author: ?Marylu Lund, MD ?11/02/2021 2:09 PM ? ?For on call review [email or web address]  ?

## 2021-11-02 NOTE — [place] FL2 (Signed)
? MEDICAID FL2 LEVEL OF CARE SCREENING TOOL  ?  ? ?IDENTIFICATION  ?Patient Name: ?Marilyn Sharp Birthdate: 11-06-1935 Sex: female Admission Date (Current Location): ?10/30/2021  ?[place] and [place] Number: ? [place] ?  Facility and Address:  ?[facility], [address] [place], [place] [address] ?     Provider Number: ?[id number]  ?Attending Physician Name and Address:  ?Donne Hazel, MD ? Relative Name and Phone Number:  ?  ?   ?Current Level of Care: ?Hospital Recommended Level of Care: ?[place] Prior Approval Number: ?  ? ?Date Approved/Denied: ?  PASRR Number: ?[id number] A ? ?Discharge Plan: ?  ?  ? ?Current Diagnoses: ?Patient Active Problem List  ? Diagnosis Date Noted  ? Leukocytosis 10/31/2021  ? Malnutrition of moderate degree 10/31/2021  ? Colitis 10/31/2021  ? AKI (acute kidney injury) ([place]) 10/30/2021  ? Enterotoxigenic Escherichia coli infection 10/30/2021  ? Acute kidney failure, unspecified ([place]) 05/24/2019  ? Chronic kidney disease, stage 3 unspecified ([place]) 05/24/2019  ? Monoclonal gammopathy 05/24/2019  ? Unspecified hypertensive kidney disease with chronic kidney disease stage I through stage IV, or unspecified 05/24/2019  ? 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 Occult blood positive stool 03/23/2018  ? Tremor 01/05/2018  ? Osteoporosis, post-menopausal 01/02/2016  ? Benign essential HTN 04/23/2015  ? Shortness of breath 04/23/2015  ? Dizziness and giddiness 02/16/2015  ? Counseling regarding end of life decision making 02/16/2015  ? Acute bronchitis 07/10/2014  ? Carotid stenosis, asymptomatic, minimal 05/30/2014  ? Right shoulder pain 10/13/2013  ? Pain, eye, right 10/13/2013  ? Major depression in remission ([place]) 06/02/2013  ? GERD (gastroesophageal reflux disease) 06/02/2013  ? Allergic rhinitis 06/02/2013  ? Palpitations 06/02/2013  ? Mitral valve prolapse 06/02/2013  ? HTN (hypertension) 06/02/2013  ? High cholesterol 06/02/2013  ?  Personal history of colonic polyps 06/02/2013  ? Osteoporosis, unspecified 06/02/2013  ? Post herpetic neuralgia 06/02/2013  ? ? ?Orientation RESPIRATION BLADDER Height & Weight   ?  ?Self, Time, Situation, Place ? Normal Continent Weight: 150 lb 9.2 oz (68.3 kg) ?Height:  '5\' 5"'$  (165.1 cm)  ?BEHAVIORAL SYMPTOMS/MOOD NEUROLOGICAL BOWEL NUTRITION STATUS  ?    Continent Diet  ?AMBULATORY STATUS COMMUNICATION OF NEEDS Skin   ?Supervision Verbally Normal ?  ?  ?  ?    ?     ?     ? ? ?Personal Care Assistance Level of Assistance  ?Bathing, Feeding, Dressing Bathing Assistance: Limited assistance ?Feeding assistance: Independent ?Dressing Assistance: Limited assistance ?   ? ?Functional Limitations Info  ?    ?  ?   ? ? ?SPECIAL CARE FACTORS FREQUENCY  ?PT (By licensed PT), OT (By licensed OT)   ?  ?PT Frequency: 3 x per week ?OT Frequency: 3 x per week ?  ?  ?  ?   ? ? ?Contractures    ? ? ?Additional Factors Info  ?Code Status, Allergies Code Status Info: full ?Allergies Info: Alendronate, Amlodipine Besylate, Ciprofloxacin, Codeine, Lovastatin, Septra (Sulfamethoxazole-trimethoprim) ?  ?  ?  ?   ? ?Current Medications (11/02/2021):  This is the current hospital active medication list ?Current Facility-Administered Medications  ?Medication Dose Route Frequency Provider Last Rate Last Admin  ? 0.9 %  sodium chloride infusion   Intravenous Continuous Donne Hazel, MD 75 mL/hr at 11/02/21 0454 New Bag at 11/02/21 0454  ? 0.9 %  sodium chloride infusion   Intravenous Continuous Vanga, Tally Due, MD      ?  acetaminophen (TYLENOL) tablet 1,000 mg  1,000 mg Oral Q6H PRN Cox, Amy N, DO      ? Or  ? acetaminophen (TYLENOL) suppository 650 mg  650 mg Rectal Q6H PRN Cox, Amy N, DO      ? feeding supplement (BOOST / RESOURCE BREEZE) liquid 1 Container  1 Container Oral TID BM Donne Hazel, MD   1 Container at 11/02/21 [phone]  ? heparin injection 5,000 Units  5,000 Units Subcutaneous Q8H Cox, Amy N, DO   5,000 Units at  11/02/21 0501  ? hydrALAZINE (APRESOLINE) tablet 10 mg  10 mg Oral Q6H PRN Cox, Amy N, DO      ? hydrALAZINE (APRESOLINE) tablet 25 mg  25 mg Oral BID Cox, Amy N, DO   25 mg at 11/02/21 0907  ? ondansetron (ZOFRAN) tablet 4 mg  4 mg Oral Q6H PRN Cox, Amy N, DO      ? Or  ? ondansetron (ZOFRAN) injection 4 mg  4 mg Intravenous Q6H PRN Cox, Amy N, DO      ? piperacillin-tazobactam (ZOSYN) IVPB 3.375 g  3.375 g Intravenous Q8H Dallie Piles, RPH 12.5 mL/hr at 11/02/21 0501 3.375 g at 11/02/21 0501  ? polyethylene glycol powder (GLYCOLAX/MIRALAX) container 255 g  1 Container Oral Once Vanga, Tally Due, MD      ? traMADol Veatrice Bourbon) tablet 50 mg  50 mg Oral Q12H PRN Cox, Amy N, DO      ? vitamin B-12 (CYANOCOBALAMIN) tablet 500 mcg  500 mcg Oral Daily Cox, Amy N, DO   500 mcg at 11/02/21 0907  ? ? ? ?Discharge Medications: ?Please see discharge summary for a list of discharge medications. ? ?Relevant Imaging Results: ? ?Relevant Lab Results: ? ? ?Additional Information ?SS #: [id number] ? ?Dola Lunsford E Ellorie Kindall, LCSW ? ? ? ? ?

## 2021-11-02 NOTE — TOC Progression Note (Addendum)
Transition of Care (TOC) - Progression Note  ? ? ?Patient Details  ?Name: Marilyn Sharp ?MRN: [id number] ?Date of Birth: 16-Aug-1935 ? ?Transition of Care (TOC) CM/SW Contact  ?Breely Panik E Demita Tobia, LCSW ?Phone Number: ?11/02/2021, 10:16 AM ? ?Clinical Narrative:      Attempted to speak with patient regarding HHPT recs. Patient says she would like me to speak with her [place]. Patient says she does have a RW already. ?Spoke to [employer]. Webb Silversmith stated they would prefer patient go to [facility] side when she is discharged, even if just for a few days.  ?Laroy Apple at [facility] who confirmed patient can come to their SNF side at time of DC (even if rec is for [facility]). Seth Bake confirmed they can take patient Monday, or later in the week if not medically ready on Monday. ?Updated Webb Silversmith who stated she will update the patient.  ?Provided weekday RNCM number. ? ? ? ?Expected Discharge Plan: Home/Self Care ?Barriers to Discharge: Continued Medical Work up ? ?Expected Discharge Plan and Services ?Expected Discharge Plan: Home/Self Care ?  ?  ?  ?Living arrangements for the past 2 months: [place] ?                ?  ?  ?  ?  ?  ?  ?  ?  ?  ?  ? ? ?Social Determinants of Health (SDOH) Interventions ?  ? ?Readmission Risk Interventions ?   ? View : No data to display.  ?  ?  ?  ? ? ?

## 2021-11-03 ENCOUNTER — Inpatient Hospital Stay: Payer: Medicare Other | Admitting: Anesthesiology

## 2021-11-03 ENCOUNTER — Encounter: Payer: Self-pay | Admitting: Internal Medicine

## 2021-11-03 ENCOUNTER — Encounter
Admission: EM | Disposition: A | Discharge status: Skilled Nursing Facility | Payer: Self-pay | Source: Home / Self Care | Attending: Internal Medicine

## 2021-11-03 ENCOUNTER — Inpatient Hospital Stay: Payer: Medicare Other

## 2021-11-03 DIAGNOSIS — K635 Polyp of colon: Secondary | ICD-10-CM

## 2021-11-03 DIAGNOSIS — K529 Noninfective gastroenteritis and colitis, unspecified: Secondary | ICD-10-CM | POA: Diagnosis not present

## 2021-11-03 DIAGNOSIS — N179 Acute kidney failure, unspecified: Secondary | ICD-10-CM | POA: Diagnosis not present

## 2021-11-03 DIAGNOSIS — I10 Essential (primary) hypertension: Secondary | ICD-10-CM | POA: Diagnosis not present

## 2021-11-03 HISTORY — PX: COLONOSCOPY WITH PROPOFOL: SHX5780

## 2021-11-03 LAB — COMPREHENSIVE METABOLIC PANEL
ALT: 9 U/L (ref 0–44)
AST: 12 U/L — ABNORMAL LOW (ref 15–41)
Albumin: 2.8 g/dL — ABNORMAL LOW (ref 3.5–5.0)
Alkaline Phosphatase: 46 U/L (ref 38–126)
Anion gap: 9 (ref 5–15)
BUN: 29 mg/dL — ABNORMAL HIGH (ref 8–23)
CO2: 21 mmol/L — ABNORMAL LOW (ref 22–32)
Calcium: 7.8 mg/dL — ABNORMAL LOW (ref 8.9–10.3)
Chloride: 112 mmol/L — ABNORMAL HIGH (ref 98–111)
Creatinine, Ser: 1.58 mg/dL — ABNORMAL HIGH (ref 0.44–1.00)
GFR, Estimated: 32 mL/min — ABNORMAL LOW (ref >60)
Glucose, Bld: 90 mg/dL (ref 70–99)
Potassium: 3.2 mmol/L — ABNORMAL LOW (ref 3.5–5.1)
Sodium: 142 mmol/L (ref 135–145)
Total Bilirubin: 0.8 mg/dL (ref 0.3–1.2)
Total Protein: 5.2 g/dL — ABNORMAL LOW (ref 6.5–8.1)

## 2021-11-03 LAB — CBC
HCT: 39.7 % (ref 36.0–46.0)
Hemoglobin: 12.7 g/dL (ref 12.0–15.0)
MCH: 28.5 pg (ref 26.0–34.0)
MCHC: 32 g/dL (ref 30.0–36.0)
MCV: 89.2 fL (ref 80.0–100.0)
Platelets: 496 10*3/uL — ABNORMAL HIGH (ref 150–400)
RBC: 4.45 MIL/uL (ref 3.87–5.11)
RDW: 15.3 % (ref 11.5–15.5)
WBC: 15 10*3/uL — ABNORMAL HIGH (ref 4.0–10.5)
nRBC: 0 % (ref 0.0–0.2)

## 2021-11-03 SURGERY — COLONOSCOPY WITH PROPOFOL
Anesthesia: Monitor Anesthesia Care

## 2021-11-03 MED ORDER — PROPOFOL 10 MG/ML IV BOLUS
INTRAVENOUS | Status: DC | PRN
  Administered 2021-11-03 (×2): 20 mg via INTRAVENOUS
  Administered 2021-11-03: 30 mg via INTRAVENOUS
  Administered 2021-11-03 (×2): 20 mg via INTRAVENOUS
  Administered 2021-11-03: 60 mg via INTRAVENOUS
  Administered 2021-11-03: 20 mg via INTRAVENOUS

## 2021-11-03 MED ORDER — SODIUM CHLORIDE 0.9 % IV SOLN
INTRAVENOUS | Status: DC | PRN
Start: 2021-11-03 — End: 2021-11-14

## 2021-11-03 MED ORDER — POTASSIUM CHLORIDE CRYS ER 20 MEQ PO TBCR
40.0000 meq | EXTENDED_RELEASE_TABLET | ORAL | Status: AC
  Administered 2021-11-03 (×2): 40 meq via ORAL
  Filled 2021-11-03 (×2): qty 2

## 2021-11-03 MED ORDER — PROPOFOL 500 MG/50ML IV EMUL
INTRAVENOUS | Status: AC
  Filled 2021-11-03: qty 50

## 2021-11-03 NOTE — Progress Notes (Signed)
Patient in recovery following Colonoscopy with Dr Vicente Males.  Complaining of 'crampy' abdominal discomfort, does not rate it as pain, but states it is uncomfortable.  Passing gas at this time, denies nausea, abdomen is soft.  Patient lying on left-side an does not want to change positions, stating that she is 'very comfortable in this position.'  Dr Vicente Males in to evaluate abdominal discomfort and feels related to air/gas in the colon following the procedure.  Vital signs are stable.  Report called to [facility], Therapist, sports.  Will have patient returned to inpatient unit. ?

## 2021-11-03 NOTE — Anesthesia Postprocedure Evaluation (Signed)
Anesthesia Post Note ? ?Patient: Marilyn Sharp ? ?Procedure(s) Performed: COLONOSCOPY WITH PROPOFOL ? ?Patient location during evaluation: Endoscopy ?Anesthesia Type: MAC ?Level of consciousness: awake and alert ?Pain management: pain level controlled ?Vital Signs Assessment: post-procedure vital signs reviewed and stable ?Respiratory status: spontaneous breathing, nonlabored ventilation, respiratory function stable and patient connected to nasal cannula oxygen ?Cardiovascular status: blood pressure returned to baseline and stable ?Postop Assessment: no apparent nausea or vomiting ?Anesthetic complications: no ? ? ?No notable events documented. ? ? ?Last Vitals:  ?Vitals:  ? 11/03/21 0901 11/03/21 0911  ?BP: (!) 141/69 (!) 143/72  ?Pulse: 75 76  ?Resp: 18 17  ?Temp:    ?SpO2: 96% 96%  ?  ?Last Pain:  ?Vitals:  ? 11/03/21 0851  ?TempSrc: Temporal  ?PainSc: 0-No pain  ? ? ?  ?  ?  ?  ?  ?  ? ?Tonny Bollman ? ? ? ? ?

## 2021-11-03 NOTE — Anesthesia Preprocedure Evaluation (Addendum)
Anesthesia Evaluation  ?Patient identified by MRN, date of birth, ID band ?Patient awake ? ? ? ?Reviewed: ?Allergy & Precautions, NPO status , Patient's Chart, lab work & pertinent test results ? ?History of Anesthesia Complications ?Negative for: history of anesthetic complications ? ?Airway ?Mallampati: III ? ?TM Distance: <3 FB ?Neck ROM: Full ? ?Mouth opening: Limited Mouth Opening ? Dental ?no notable dental hx. ? ?  ?Pulmonary ?shortness of breath (Chronic) and with exertion, neg pneumonia , neg COPD, former smoker,  ?  ?Pulmonary exam normal ?breath sounds clear to auscultation ? ? ? ? ? ? Cardiovascular ?Exercise Tolerance: Poor ?METS: < 3 Mets hypertension (On hydralazine- controlled), Pt. on medications ?Normal cardiovascular exam+ Valvular Problems/Murmurs ([place])  ?Rhythm:Regular Rate:Normal ? ? ?  ?Neuro/Psych ? Headaches, PSYCHIATRIC DISORDERS Anxiety Depression   ? GI/Hepatic ?Neg liver ROS, GERD (Not on meds; Pt denies)  Controlled,  ?Endo/Other  ?negative endocrine ROS ? Renal/GU ?Renal disease  ?negative genitourinary ?  ?Musculoskeletal ? ?(+) Arthritis ,  ? Abdominal ?  ?Peds ? Hematology ?negative hematology ROS ?(+)   ?Anesthesia Other Findings ? ? Reproductive/Obstetrics ?negative OB ROS ? ?  ? ? ? ? ? ? ? ? ? ? ? ? ? ?  ?  ? ? ? ? ? ? ? ?Anesthesia Physical ?Anesthesia Plan ? ?ASA: 3 ? ?Anesthesia Plan: MAC  ? ?Post-op Pain Management:   ? ?Induction: Intravenous ? ?PONV Risk Score and Plan: 0 and Ondansetron ? ?Airway Management Planned: Natural Airway and Nasal Cannula ? ?Additional Equipment:  ? ?Intra-op Plan:  ? ?Post-operative Plan:  ? ?Informed Consent: I have reviewed the patients History and Physical, chart, labs and discussed the procedure including the risks, benefits and alternatives for the proposed anesthesia with the patient or authorized representative who has indicated his/her understanding and acceptance.  ? ? ? ?Dental Advisory  Given ? ?Plan Discussed with: Anesthesiologist, CRNA and Surgeon ? ?Anesthesia Plan Comments: (Patient consented for risks of anesthesia including but not limited to:  ?- adverse reactions to medications ?- damage to eyes, teeth, lips or other oral mucosa ?- nerve damage due to positioning  ?- sore throat or hoarseness ?- Damage to heart, brain, nerves, lungs, other parts of body or loss of life ? ?Patient voiced understanding.)  ? ? ? ? ? ? ?Anesthesia Quick Evaluation ? ?

## 2021-11-03 NOTE — Transfer of Care (Signed)
Immediate Anesthesia Transfer of Care Note ? ?Patient: Marilyn Sharp ? ?Procedure(s) Performed: COLONOSCOPY WITH PROPOFOL ? ?Patient Location: PACU ? ?Anesthesia Type:MAC ? ?Level of Consciousness: drowsy ? ?Airway & Oxygen Therapy: Patient Spontanous Breathing and Patient connected to nasal cannula oxygen ? ?Post-op Assessment: Report given to RN, Post -op Vital signs reviewed and stable and Patient moving all extremities ? ?Post vital signs: Reviewed and stable ? ?Last Vitals:  ?Vitals Value Taken Time  ?BP 121/66 11/03/21 0843  ?Temp    ?Pulse 74 11/03/21 0843  ?Resp 20 11/03/21 0843  ?SpO2 98 % 11/03/21 0843  ? ? ?Last Pain:  ?Vitals:  ? 11/03/21 0412  ?TempSrc: Oral  ?PainSc:   ?   ? ?  ? ?Complications: No notable events documented. ?

## 2021-11-03 NOTE — Progress Notes (Signed)
GI note ? ? ?I just did her colon and she had very hemorrhagic patchy bullous  blebs and colitis , not typical for ulcerative colitis, likely ischemic. Am unsure why this has been going on for so long and has been seen on repeated abdominal imaging  ,wonder if there is any acute on chronic mesenteric ischemia , would suggest  consulting vascular surgery. With Aki cannot perform any CT angio with contrast at this time. I will advance diet and suggest continue rehydration and antibiotics ? ?Dr [name] MD,MRCP [facility]) ?Gastroenterology/Hepatology ?Pager: [phone] ? ?

## 2021-11-03 NOTE — Progress Notes (Signed)
?  Progress Note ? ? ?Patient: Marilyn Sharp GXQ:[id number] DOB: 14-Sep-1935 DOA: 10/30/2021     3 ?DOS: the patient was seen and examined on 11/03/2021 ?  ?Brief hospital course: ?Ms. Keoshia Braver is an 86 year old female with with history of depression, anxiety, hyperlipidemia, hypertension, CKD 3B, GERD, MGUS, who presents emergency department for chief concerns of persistent colitis. ? ?Initial vitals in the emergency department show temperature of 98.8, respiration rate of 16, heart rate 81, blood pressure 149/87, SPO2 of 96% on room air. ? ?Serum sodium 136, potassium 3.6, chloride 99, bicarb 26, BUN 59, serum creatinine of 2.60, nonfasting blood glucose 118, GFR 18, WBC 22.6, hemoglobin 13.2, platelets of 512. ? ?UA was negative for leukocytes and nitrates. ? ?ED treatment: IV Zosyn 3.375 mg one-time dose, sodium chloride 500 mL bolus. ? ?Assessment and Plan: ?* AKI (acute kidney injury) ([place]) ?- Presumed secondary to contrast nephropathy in setting of recent CT studies involving contrast ?-Cr down to 1.58 ?-Cont on IVF ? ?Leukocytosis ?-Likely secondary to above colitis ?-WBC trending down ?- CBC in the a.m. ? ?Enterotoxigenic Escherichia coli infection ?- GI and C. difficile PCR panel reviewed. CDiff is neg. Stool study is pos for ETEC ?-Given hx of recurrent colitis, GI was consulted at time of presentation ?-Continued on empiric zosyn ?-GI following. Pt underwent colonoscopy with findings of patchy bullous blebs and colitis, appearing ischemic. Consulted Vascular Surgery for further recs ?-Per Vascular Surgery, recs for mesenteric [place] and possible CT angiogram as soon as renal function allows. Cont bowel rest, IV hydration and abx ? ?Acute kidney failure, unspecified ([place]) ?-Renal US performed, reviewed. No hydronephrosis. -Possible non-obstructive calculus in mid-pole ?-Pt is s/p recent CT studies involving contrast  ?-CT abd/pelvis 3/22 reviewed. Findings suggestive of ATN vs contrast nephropathy ?-Will cont to  avoid nephrotoxic agents. Cont IVF hydration ?-Cr continues to trend down ?-Repeat bmet in AM ? ?Benign essential HTN ?- Cont hydralazine '50mg'$  q8h ?- Hydralazine 10 mg p.o. every 6 hours as needed for SBP greater than 175 ? ?HTN (hypertension) ?Continued on hydralazine at '50mg'$  q8h ?Cont PRN hydralazine as needed ? ? ? ? ?  ? ?Subjective: Without complaints this AM ? ?Physical Exam: ?Vitals:  ? 11/03/21 0851 11/03/21 0901 11/03/21 0911 11/03/21 1004  ?BP: 131/69 (!) 141/69 (!) 143/72 138/68  ?Pulse: 74 75 76 80  ?Resp: (!) '22 18 17 18  '$ ?Temp: 97.6 ?F (36.4 ?C)   97.8 ?F (36.6 ?C)  ?TempSrc: Temporal   Oral  ?SpO2: 97% 96% 96% 96%  ?Weight:      ?Height:      ? ?General exam: Awake, laying in bed, in nad ?Respiratory system: Normal respiratory effort, no wheezing ?Cardiovascular system: regular rate, s1, s2 ?Gastrointestinal system: Soft, nondistended, positive BS ?Central nervous system: CN2-12 grossly intact, strength intact ?Extremities: Perfused, no clubbing ?Skin: Normal skin turgor, no notable skin lesions seen ?Psychiatry: Mood normal // no visual hallucinations  ? ?Data Reviewed: ? ?Labs reviewed, Cr 1.58, potassium 3.2 ? ?Family Communication: Pt in room ? ?Disposition: ?Status is: Inpatient ?Continue inpatient stay because: Severity of illness needing IVF ? Planned Discharge Destination: Home ? ? ? ?Author: ?Marylu Lund, MD ?11/03/2021 3:49 PM ? ?For on call review [email or web address]  ?

## 2021-11-03 NOTE — Progress Notes (Addendum)
Patient's last BM around 6am continues to be brown diarrhea with small red streaks of blood. Patient refused to drink  last cup and a half of bowel prep last night even after encouraging several times. Has been NPO since midnight. GI specialist Dr. Vicente Males notified and is ok with this.  ?

## 2021-11-03 NOTE — H&P (Signed)
? ? ? ?[name], MD ?[address] [place], [place], [place], [address] ?[address], [place], [place], [place], [address] ?Phone: [phone]  ?Fax: [phone] ? ?Primary Care Physician:  Idelle Crouch, MD ? ? ?Pre-Procedure History & Physical: ?HPI:  Marilyn Sharp is a 86 y.o. female is here for an colonoscopy. ?  ?Past Medical History:  ?Diagnosis Date  ? Allergy   ? Anxiety   ? Arthritis   ? Chicken pox   ? Depression   ? Family history of polyps in the colon   ? GERD (gastroesophageal reflux disease)   ? Headache   ? Heart murmur   ? Heart palpitations   ? History of kidney stones   ? Hyperlipidemia   ? Hypertension   ? Kidney stone   ? Shingles 2012  ? Stress fracture of lumbar vertebra   ? ? ?Past Surgical History:  ?Procedure Laterality Date  ? bilateral blephoplasty  2001  ? BREAST CYST ASPIRATION  2001  ? BREAST SURGERY Left   ? benign cyst  ? CATARACT EXTRACTION, BILATERAL  2007  ? EYE SURGERY    ? KYPHOPLASTY N/A 09/11/2015  ? Procedure: KYPHOPLASTY T12;  Surgeon: Hessie Knows, MD;  Location: [facility] ORS;  Service: Orthopedics;  Laterality: N/A;  ? RHINOPLASTY    ? ? ?Prior to Admission medications   ?Medication Sig Start Date End Date Taking? Authorizing Provider  ?torsemide (DEMADEX) 20 MG tablet Take 20 mg by mouth daily.   Yes [provider]  ?acetaminophen (TYLENOL) 325 MG tablet Take 650 mg by mouth every 6 (six) hours as needed.    [provider]  ?Cholecalciferol (VITAMIN D3) 50 MCG (2000 UT) capsule Take by mouth daily.    [provider]  ?hydrALAZINE (APRESOLINE) 25 MG tablet Take 25 mg by mouth 2 (two) times daily.    [provider]  ?polyethylene glycol (MIRALAX / GLYCOLAX) 17 g packet Take 17 g by mouth daily.    [provider]  ?traMADol (ULTRAM) 50 MG tablet Take 1 tablet (50 mg total) by mouth every 6 (six) hours as needed. 10/26/21 10/26/22  Lavonia Drafts, MD  ?vitamin B-12 (CYANOCOBALAMIN) 500 MCG tablet Take 500 mcg by mouth  daily.    [provider]  ? ? ?Allergies as of 10/30/2021 - Review Complete 10/30/2021  ?Allergen Reaction Noted  ? Alendronate  01/02/2016  ? Amlodipine besylate Other (See Comments) 09/07/2015  ? Ciprofloxacin Hives 06/02/2013  ? Codeine Itching 06/02/2013  ? Lovastatin  04/28/2016  ? Septra [sulfamethoxazole-trimethoprim] Itching 06/02/2013  ? ? ?Family History  ?Problem Relation Age of Onset  ? Arthritis Mother   ? Hypertension Mother   ? Osteoporosis Mother   ? COPD Father   ? Arthritis Maternal Grandmother   ? Heart disease Maternal Grandfather   ? Colon cancer Neg Hx   ? Breast cancer Neg Hx   ? ? ?Social History  ? ?Socioeconomic History  ? Marital status: Widowed  ?  Spouse name: Not on file  ? Number of children: Not on file  ? Years of education: Not on file  ? Highest education level: Not on file  ?Occupational History  ? Not on file  ?Tobacco Use  ? Smoking status: Former  ?  Packs/day: 1.00  ?  Years: 20.00  ?  Pack years: 20.00  ?  Types: Cigarettes  ?  Quit date: 08/11/1973  ?  Years since quitting: 48.2  ? Smokeless tobacco: Never  ?  Substance and Sexual Activity  ? Alcohol use: Yes  ?  Alcohol/week: 2.0 standard drinks  ?  Types: 2 Glasses of wine per week  ? Drug use: No  ? Sexual activity: Never  ?Other Topics Concern  ? Not on file  ?Social History Narrative  ? Has son with Down's syndrome  ? Widowed.  ? Minimal exercise  ? Diet: healthy  ?   ? ?Social Determinants of Health  ? ?Financial Resource Strain: Not on file  ?Food Insecurity: Not on file  ?Transportation Needs: Not on file  ?Physical Activity: Not on file  ?Stress: Not on file  ?Social Connections: Not on file  ?Intimate Partner Violence: Not on file  ? ? ?Review of Systems: ?See HPI, otherwise negative ROS ? ?Physical Exam: ?BP 127/66 (BP Location: Right Arm)   Pulse 76   Temp 98.3 ?F (36.8 ?C) (Oral)   Resp 18   Ht '5\' 5"'$  (1.651 m)   Wt 68.3 kg   SpO2 93%   BMI 25.06 kg/m?  ?General:   Alert,  pleasant and cooperative in  NAD ?Head:  Normocephalic and atraumatic. ?Neck:  Supple; no masses or thyromegaly. ?Lungs:  Clear throughout to auscultation, normal respiratory effort.    ?Heart:  +S1, +S2, Regular rate and rhythm, No edema. ?Abdomen:  Soft, nontender and nondistended. Normal bowel sounds, without guarding, and without rebound.   ?Neurologic:  Alert and  oriented x4;  grossly normal neurologically. ? ?Impression/Plan: ?Marilyn Sharp is here for an colonoscopy to be performed for colitis.  ? ?Risks, benefits, limitations, and alternatives regarding  colonoscopy have been reviewed with the patient.  Questions have been answered.  All parties agreeable. ? ? ?[name], MD  11/03/2021, 8:15 AM ? ?

## 2021-11-03 NOTE — Op Note (Signed)
[facility] ?Gastroenterology ?Patient Name: Marilyn Sharp ?Procedure Date: 11/03/2021 8:17 AM ?MRN: [id number] ?Account #: [id number] ?Date of Birth: 02-13-1936 ?Admit Type: Outpatient ?Age: 86 ?Room: [facility] ENDO ROOM 4 ?Gender: Female ?Note Status: Finalized ?Instrument Name: Peds Colonoscope [id number] ?Procedure:             Colonoscopy ?Indications:           Follow-up of colitis ?Providers:             [name] MD, MD ?Medicines:             Monitored Anesthesia Care ?Complications:         No immediate complications. ?Procedure:             Pre-Anesthesia Assessment: ?                       - Prior to the procedure, a History and Physical was  ?                       performed, and patient medications, allergies and  ?                       sensitivities were reviewed. The patient's tolerance  ?                       of previous anesthesia was reviewed. ?                       - The risks and benefits of the procedure and the  ?                       sedation options and risks were discussed with the  ?                       patient. All questions were answered and informed  ?                       consent was obtained. ?                       - ASA Grade Assessment: III - A patient with severe  ?                       systemic disease. ?                       After obtaining informed consent, the colonoscope was  ?                       passed under direct vision. Throughout the procedure,  ?                       the patient's blood pressure, pulse, and oxygen  ?                       saturations were monitored continuously. The  ?                       Colonoscope was introduced through the anus and  ?  advanced to the the cecum, identified by the  ?                       appendiceal orifice. The colonoscopy was performed  ?                       with ease. The patient tolerated the procedure well.  ?                       The quality of the bowel preparation was  poor. ?Findings: ?     A 6 mm polyp was found in the proximal ascending colon. The polyp was  ?     sessile. The polyp was removed with a cold snare. Resection and  ?     retrieval were complete. ?     Normal mucosa was found in the rectum and in the ascending colon.  ?     Biopsies were taken with a cold forceps for histology. ?     Discontinuous areas of nonbleeding ulcerated mucosa with stigmata of  ?     recent bleeding were present in the sigmoid colon, in the descending  ?     colon, in the transverse colon and at the hepatic flexure. Biopsies were  ?     taken with a cold forceps for histology. There were hemorrhagic blebs  ?     seen in a patchy manner, severe mucosal edema, oozing on contact ?Impression:            - Preparation of the colon was poor. ?                       - One 6 mm polyp in the proximal ascending colon,  ?                       removed with a cold snare. Resected and retrieved. ?                       - Normal mucosa in the rectum and in the ascending  ?                       colon. Biopsied. ?                       - Mucosal ulceration. Biopsied. ?Recommendation:        - Return patient to hospital ward for ongoing care. ?                       - Advance diet as tolerated. ?                       - The colitis was sparing the rectum and ascending  ?                       colon, rerst of colon affected more over the left  ?                       colon where it was more continious, didnt have  ?  appearance of inflammatory bowel disease ,  ?                       differentials are ischemic vs infectious or both . IV  ?                       fluids, treating the AKi along with antibiotics for  ?                       the infectious diarrhea should help. ?Procedure Code(s):     --- Professional --- ?                       [phone], Colonoscopy, flexible; with removal of  ?                       tumor(s), polyp(s), or other lesion(s) by snare  ?                        technique ?                       45380, 59, Colonoscopy, flexible; with biopsy, single  ?                       or multiple ?Diagnosis Code(s):     --- Professional --- ?                       K63.5, Polyp of colon ?                       K63.3, Ulcer of intestine ?                       K52.9, Noninfective gastroenteritis and colitis,  ?                       unspecified ?CPT copyright 2019 American Medical Association. All rights reserved. ?The codes documented in this report are preliminary and upon coder review may  ?be revised to meet current compliance requirements. ?[name], MD ?[name] MD, MD ?11/03/2021 8:44:16 AM ?This report has been signed electronically. ?Number of Addenda: 0 ?Note Initiated On: 11/03/2021 8:17 AM ?Scope Withdrawal Time: 0 hours 11 minutes 36 seconds  ?Total Procedure Duration: 0 hours 20 minutes 3 seconds  ?Estimated Blood Loss:  Estimated blood loss: none. ?     [facility] ?

## 2021-11-03 NOTE — Consult Note (Signed)
[facility] [place] ? ?ASSESSMENT / PLAN: ?86 y.o. female with possible ischemic colitis. Acute or chronic mesenteric ischemia seems unlikely based on history, exam, and review of recent non-contrast CT scan. Will check mesenteric ultrasound now to evaluate the mesenteric flow. Recommend a CT angiogram as soon as renal function allows. Recommend bowel rest, IV hydration and antibiotics.  ? ?CHIEF COMPLAINT: leg swelling, fatigue ? ?HISTORY OF PRESENT ILLNESS: ?Marilyn Sharp is a 86 y.o. female admitted to the hospital for persistent colitis. The patient underwent colonoscopy earlier today which revealed hemorrhagic, patchy areas of colitis concerning to Dr. Vicente Males for ischemic colitis. On my evaluation, the patient reports she has been dealing with fatigue and leg swelling predominantly. She reports occasional, mild abdominal pain. She does not report postprandial pain. She does endorse an approximate 10lb weight loss over the past several months. She thinks diminished appetite and poor energy is the cause for her weight loss.  ? ?VASCULAR SURGICAL HISTORY: none ? ?VASCULAR RISK FACTORS: ?Negative history of stroke / transient ischemic attack. ?Negative history of coronary artery disease.  ?Negative history of diabetes mellitus.  ?Positive history of smoking. Remote - quit 1976.  ?Positive history of hypertension.  ?Positive history of chronic kidney disease.  Last GFR 32. Negative history of chronic obstructive pulmonary disease. ? ?FUNCTIONAL STATUS: ?ECOG performance status: (2) Ambulatory and capable of self care, unable to carry out work activity, up and about > 50% or waking hours ?Ambulatory status: Minimally ambulatory (e.g. about the home only) ? ?Past Medical History:  ?Diagnosis Date  ? Allergy   ? Anxiety   ? Arthritis   ? Chicken pox   ? Depression   ? Family history of polyps in the colon   ? GERD (gastroesophageal reflux disease)   ? Headache   ? Heart murmur   ? Heart  palpitations   ? History of kidney stones   ? Hyperlipidemia   ? Hypertension   ? Kidney stone   ? Shingles 2012  ? Stress fracture of lumbar vertebra   ? ? ?Past Surgical History:  ?Procedure Laterality Date  ? bilateral blephoplasty  2001  ? BREAST CYST ASPIRATION  2001  ? BREAST SURGERY Left   ? benign cyst  ? CATARACT EXTRACTION, BILATERAL  2007  ? EYE SURGERY    ? KYPHOPLASTY N/A 09/11/2015  ? Procedure: KYPHOPLASTY T12;  Surgeon: Hessie Knows, MD;  Location: [facility] ORS;  Service: Orthopedics;  Laterality: N/A;  ? RHINOPLASTY    ? ? ?Family History  ?Problem Relation Age of Onset  ? Arthritis Mother   ? Hypertension Mother   ? Osteoporosis Mother   ? COPD Father   ? Arthritis Maternal Grandmother   ? Heart disease Maternal Grandfather   ? Colon cancer Neg Hx   ? Breast cancer Neg Hx   ? ? ?Social History  ? ?Socioeconomic History  ? Marital status: Widowed  ?  Spouse name: Not on file  ? Number of children: Not on file  ? Years of education: Not on file  ? Highest education level: Not on file  ?Occupational History  ? Not on file  ?Tobacco Use  ? Smoking status: Former  ?  Packs/day: 1.00  ?  Years: 20.00  ?  Pack years: 20.00  ?  Types: Cigarettes  ?  Quit date: 08/11/1973  ?  Years since quitting: 48.2  ? Smokeless tobacco: Never  ?Substance and Sexual Activity  ? Alcohol use: Yes  ?  Alcohol/week: 2.0 standard drinks  ?  Types: 2 Glasses of wine per week  ? Drug use: No  ? Sexual activity: Never  ?Other Topics Concern  ? Not on file  ?Social History Narrative  ? Has son with Down's syndrome  ? Widowed.  ? Minimal exercise  ? Diet: healthy  ?   ? ?Social Determinants of Health  ? ?Financial Resource Strain: Not on file  ?Food Insecurity: Not on file  ?Transportation Needs: Not on file  ?Physical Activity: Not on file  ?Stress: Not on file  ?Social Connections: Not on file  ?Intimate Partner Violence: Not on file  ? ? ?Allergies  ?Allergen Reactions  ? Alendronate   ?  Other reaction(s): Other (See Comments) ?Chest  pain  ? Amlodipine Besylate Other (See Comments)  ?  "dizzy"  ? Ciprofloxacin Hives  ? Codeine Itching  ? Lovastatin   ?  Other reaction(s): Muscle Pain  ? Septra [Sulfamethoxazole-Trimethoprim] Itching  ? ? ?Current Facility-Administered Medications  ?Medication Dose Route Frequency Provider Last Rate Last Admin  ? 0.9 %  sodium chloride infusion   Intravenous Continuous Donne Hazel, MD 75 mL/hr at 11/03/21 1051 New Bag at 11/03/21 1051  ? 0.9 %  sodium chloride infusion   Intravenous Continuous Lin Landsman, MD   New Bag at 11/03/21 0810  ? 0.9 %  sodium chloride infusion   Intravenous PRN Donne Hazel, MD   Stopped at 11/03/21 [phone]  ? acetaminophen (TYLENOL) tablet 500-1,000 mg  500-1,000 mg Oral Q6H PRN Sharion Settler, NP   500 mg at 11/02/21 2300  ? feeding supplement (BOOST / RESOURCE BREEZE) liquid 1 Container  1 Container Oral TID BM Donne Hazel, MD   1 Container at 11/03/21 [phone]  ? heparin injection 5,000 Units  5,000 Units Subcutaneous Q8H Cox, Amy N, DO   5,000 Units at 11/03/21 7902  ? hydrALAZINE (APRESOLINE) tablet 10 mg  10 mg Oral Q6H PRN Cox, Amy N, DO      ? hydrALAZINE (APRESOLINE) tablet 50 mg  50 mg Oral Q8H Donne Hazel, MD   50 mg at 11/03/21 4097  ? ondansetron (ZOFRAN) tablet 4 mg  4 mg Oral Q6H PRN Cox, Amy N, DO      ? Or  ? ondansetron (ZOFRAN) injection 4 mg  4 mg Intravenous Q6H PRN Cox, Amy N, DO      ? piperacillin-tazobactam (ZOSYN) IVPB 3.375 g  3.375 g Intravenous Q8H Dallie Piles, RPH 12.5 mL/hr at 11/03/21 0520 Infusion Verify at 11/03/21 0520  ? vitamin B-12 (CYANOCOBALAMIN) tablet 500 mcg  500 mcg Oral Daily Cox, Amy N, DO   500 mcg at 11/03/21 3532  ? ? ?PHYSICAL EXAM ?Vitals:  ? 11/03/21 0851 11/03/21 0901 11/03/21 0911 11/03/21 1004  ?BP: 131/69 (!) 141/69 (!) 143/72 138/68  ?Pulse: 74 75 76 80  ?Resp: (!) '22 18 17 18  '$ ?Temp: 97.6 ?F (36.4 ?C)   97.8 ?F (36.6 ?C)  ?TempSrc: Temporal   Oral  ?SpO2: 97% 96% 96% 96%  ?Weight:      ?Height:       ? ? ?Constitutional: Elderly appearing. no distress. Appears marginally nourished.  ?Neurologic: CN intact. no focal findings. no sensory loss. ?Psychiatric:  Mood and affect symmetric and appropriate. ?Eyes:  No icterus. No conjunctival pallor. ?Ears, nose, throat:  mucous membranes tacky. Midline trachea.  ?Cardiac: regular rate and rhythm.  ?Respiratory:  unlabored. ?Abdominal:  soft, non-tender, non-distended.  ?Peripheral  vascular: 2+ radial pulses; 2+ femoral pulses ?Extremity: 2+ pitting pretibial edema. no cyanosis. no pallor.  ?Skin: no gangrene. no ulceration.  ?Lymphatic: no Stemmer's sign. no palpable lymphadenopathy. ? ?PERTINENT LABORATORY AND RADIOLOGIC DATA ? ?Most recent CBC ? ?  Latest Ref Rng & Units 11/03/2021  ?  5:04 AM 11/02/2021  ?  5:39 AM 11/01/2021  ?  2:19 PM  ?CBC  ?WBC 4.0 - 10.5 K/uL 15.0   15.9     ?Hemoglobin 12.0 - 15.0 g/dL 12.7   12.7   13.1    ?Hematocrit 36.0 - 46.0 % 39.7   39.2   41.3    ?Platelets 150 - 400 K/uL 496   484     ?  ? ?Most recent CMP ? ?  Latest Ref Rng & Units 11/03/2021  ?  5:04 AM 11/02/2021  ?  5:39 AM 11/01/2021  ?  4:52 AM  ?CMP  ?Glucose 70 - 99 mg/dL 90   98   98    ?BUN 8 - 23 mg/dL 29   38   50    ?Creatinine 0.44 - 1.00 mg/dL 1.58   1.93   2.40    ?Sodium 135 - 145 mmol/L 142   143   141    ?Potassium 3.5 - 5.1 mmol/L 3.2   3.6   3.1    ?Chloride 98 - 111 mmol/L 112   112   107    ?CO2 22 - 32 mmol/L '21   22   24    '$ ?Calcium 8.9 - 10.3 mg/dL 7.8   7.9   7.9    ?Total Protein 6.5 - 8.1 g/dL 5.2   5.0   5.5    ?Total Bilirubin 0.3 - 1.2 mg/dL 0.8   1.1   1.1    ?Alkaline Phos 38 - 126 U/L 46   44   49    ?AST 15 - 41 U/L '12   11   11    '$ ?ALT 0 - 44 U/L '9   8   10    '$ ? ? ?Renal function ?Estimated Creatinine Clearance: 24.3 mL/min (A) (by C-G formula based on SCr of 1.58 mg/dL (H)). ? ?No results found for: HGBA1C ? ?LDL Cholesterol  ?Date Value Ref Range Status  ?02/16/2015 88 0 - 99 mg/dL Final  ?  Comment:  ?    ?Total Cholesterol/HDL Ratio:CHD Risk ?                        Coronary Heart Disease Risk Table ?                                       Men       Women ?         1/2 Average Risk              3.4        3.3 ?             Average Risk              5.0        4.4

## 2021-11-04 ENCOUNTER — Encounter: Payer: Self-pay | Admitting: Gastroenterology

## 2021-11-04 DIAGNOSIS — K529 Noninfective gastroenteritis and colitis, unspecified: Secondary | ICD-10-CM | POA: Diagnosis not present

## 2021-11-04 DIAGNOSIS — N179 Acute kidney failure, unspecified: Secondary | ICD-10-CM | POA: Diagnosis not present

## 2021-11-04 DIAGNOSIS — I10 Essential (primary) hypertension: Secondary | ICD-10-CM | POA: Diagnosis not present

## 2021-11-04 LAB — COMPREHENSIVE METABOLIC PANEL
ALT: 8 U/L (ref 0–44)
AST: 12 U/L — ABNORMAL LOW (ref 15–41)
Albumin: 2.5 g/dL — ABNORMAL LOW (ref 3.5–5.0)
Alkaline Phosphatase: 42 U/L (ref 38–126)
Anion gap: 7 (ref 5–15)
BUN: 20 mg/dL (ref 8–23)
CO2: 20 mmol/L — ABNORMAL LOW (ref 22–32)
Calcium: 7.7 mg/dL — ABNORMAL LOW (ref 8.9–10.3)
Chloride: 116 mmol/L — ABNORMAL HIGH (ref 98–111)
Creatinine, Ser: 1.45 mg/dL — ABNORMAL HIGH (ref 0.44–1.00)
GFR, Estimated: 36 mL/min — ABNORMAL LOW (ref >60)
Glucose, Bld: 85 mg/dL (ref 70–99)
Potassium: 3.7 mmol/L (ref 3.5–5.1)
Sodium: 143 mmol/L (ref 135–145)
Total Bilirubin: 0.8 mg/dL (ref 0.3–1.2)
Total Protein: 4.7 g/dL — ABNORMAL LOW (ref 6.5–8.1)

## 2021-11-04 LAB — CBC
HCT: 37.8 % (ref 36.0–46.0)
Hemoglobin: 12 g/dL (ref 12.0–15.0)
MCH: 28.6 pg (ref 26.0–34.0)
MCHC: 31.7 g/dL (ref 30.0–36.0)
MCV: 90.2 fL (ref 80.0–100.0)
Platelets: 506 10*3/uL — ABNORMAL HIGH (ref 150–400)
RBC: 4.19 MIL/uL (ref 3.87–5.11)
RDW: 15.5 % (ref 11.5–15.5)
WBC: 16.4 10*3/uL — ABNORMAL HIGH (ref 4.0–10.5)
nRBC: 0 % (ref 0.0–0.2)

## 2021-11-04 MED ORDER — ENSURE ENLIVE PO LIQD
237.0000 mL | Freq: Three times a day (TID) | ORAL | Status: DC
  Administered 2021-11-04 – 2021-11-14 (×28): 237 mL via ORAL

## 2021-11-04 MED ORDER — ADULT MULTIVITAMIN W/MINERALS CH
1.0000 | ORAL_TABLET | Freq: Every day | ORAL | Status: DC
  Administered 2021-11-04 – 2021-11-14 (×10): 1 via ORAL
  Filled 2021-11-04 (×10): qty 1

## 2021-11-04 NOTE — Progress Notes (Signed)
? ?Marilyn Sharp , MD ?[address], [place], [place], [place], [address] ?[address], [place], [place], [place], [address] ?Phone: [phone]  ?Fax: [phone] ? ? ?Marilyn Sharp is being followed for colitis  ? ?Subjective: ? ?She is doing better today no abdominal pain.  1 bowel movement since morning still has some blood in it ? ?Objective: ?Vital signs in last 24 hours: ?Vitals:  ? 11/03/21 0911 11/03/21 1004 11/03/21 1916 11/04/21 0252  ?BP: (!) 143/72 138/68 123/60 (!) 142/77  ?Pulse: 76 80 85 75  ?Resp: '17 18 18 16  '$ ?Temp:  97.8 ?F (36.6 ?C) 98.4 ?F (36.9 ?C) 98.2 ?F (36.8 ?C)  ?TempSrc:  Oral  Oral  ?SpO2: 96% 96% 96% 93%  ?Weight:      ?Height:      ? ?Weight change:  ? ?Intake/Output Summary (Last 24 hours) at 11/04/2021 6195 ?Last data filed at 11/04/2021 0025 ?Gross per 24 hour  ?Intake 1967.04 ml  ?Output 0 ml  ?Net 1967.04 ml  ? ? ? ?Exam: ?Heart:: Regular rate and rhythm ?Lungs: normal ?Abdomen: soft, nontender, normal bowel sounds ? ? ?Lab Results: ?'@LABTEST2'$ @ ?Micro Results: ?Recent Results (from the past 240 hour(s))  ?Gastrointestinal Panel by PCR , Stool     Status: Abnormal  ? Collection Time: 10/31/21  8:20 AM  ? Specimen: Stool  ?Result Value Ref Range Status  ? Campylobacter species NOT DETECTED NOT DETECTED Final  ? Plesimonas shigelloides NOT DETECTED NOT DETECTED Final  ? Salmonella species NOT DETECTED NOT DETECTED Final  ? Yersinia enterocolitica NOT DETECTED NOT DETECTED Final  ? Vibrio species NOT DETECTED NOT DETECTED Final  ? Vibrio cholerae NOT DETECTED NOT DETECTED Final  ? Enteroaggregative E coli (EAEC) NOT DETECTED NOT DETECTED Final  ? Enteropathogenic E coli (EPEC) NOT DETECTED NOT DETECTED Final  ? Enterotoxigenic E coli (ETEC) DETECTED (A) NOT DETECTED Final  ?  Comment: RESULT CALLED TO, READ BACK BY AND VERIFIED WITH: ?JASMYNE DAVIS 10/31/21 1012 MW ?  ? Shiga like toxin producing E coli (STEC) NOT DETECTED NOT DETECTED Final  ? Shigella/Enteroinvasive E coli (EIEC)  NOT DETECTED NOT DETECTED Final  ? Cryptosporidium NOT DETECTED NOT DETECTED Final  ? Cyclospora cayetanensis NOT DETECTED NOT DETECTED Final  ? Entamoeba histolytica NOT DETECTED NOT DETECTED Final  ? Giardia lamblia NOT DETECTED NOT DETECTED Final  ? Adenovirus F40/41 NOT DETECTED NOT DETECTED Final  ? Astrovirus NOT DETECTED NOT DETECTED Final  ? Norovirus GI/GII NOT DETECTED NOT DETECTED Final  ? Rotavirus A NOT DETECTED NOT DETECTED Final  ? Sapovirus (I, II, IV, and V) NOT DETECTED NOT DETECTED Final  ?  Comment: Performed at [facility], [address], [place], [place] [address]  ?C Difficile Quick Screen w PCR reflex     Status: None  ? Collection Time: 10/31/21  8:25 AM  ? Specimen: Stool  ?Result Value Ref Range Status  ? C Diff antigen NEGATIVE NEGATIVE Final  ? C Diff toxin NEGATIVE NEGATIVE Final  ? C Diff interpretation No C. difficile detected.  Final  ?  Comment: Performed at [facility], [address] [place], [place] [address]  ? ?Studies/Results: ?[place] MESENTERIC ARTERIES ? ?Result Date: 11/03/2021 ?CLINICAL DATA:  Ischemic colitis EXAM: [place] MESENTERIC ARTERIAL DOPPLER COMPARISON:  CT abdomen pelvis without contrast 10/30/2021 FINDINGS: Celiac axis: 280 cm/sec Celiac axis with inspiration: 260 cm/sec Celiac axis with expiration: 254 cm/sec Splenic artery: 80 cm/sec Hepatic artery: 73 cm/sec SMA: 129-207 cm/sec  IMA: 31 cm/sec Aorta: 101 cm/sec Aortic size: 3.1 cm proximally, 2.0 cm in the mid segment and 1.7 cm distally Scattered calcified plaque seen throughout the abdominal aorta. 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 Electronically Signed   By: Miachel Roux M.D.   On: 11/03/2021 15:54   ?Medications: I have reviewed the patient's current medications. ?Scheduled Meds: ? feeding supplement  1 Container Oral TID BM  ? heparin injection (subcutaneous)  5,000 Units Subcutaneous Q8H  ? hydrALAZINE  50 mg Oral Q8H  ? vitamin B-12   500 mcg Oral Daily  ? ?Continuous Infusions: ? sodium chloride 75 mL/hr at 11/04/21 0025  ? sodium chloride    ? sodium chloride Stopped (11/03/21 0438)  ? piperacillin-tazobactam (ZOSYN)  IV 3.375 g (11/04/21 0513)  ? ?PRN Meds:.sodium chloride, acetaminophen, hydrALAZINE, ondansetron **OR** ondansetron (ZOFRAN) IV ? ? ?Assessment: ?Principal Problem: ?  AKI (acute kidney injury) ([place]) ?Active Problems: ?  Major depression in remission [facility]) ?  GERD (gastroesophageal reflux disease) ?  HTN (hypertension) ?  Carotid stenosis, asymptomatic, minimal ?  Benign essential HTN ?  Acute kidney failure, unspecified ([place]) ?  Chronic kidney disease, stage 3 unspecified ([place]) ?  Monoclonal gammopathy ?  Enterotoxigenic Escherichia coli infection ?  Leukocytosis ?  Malnutrition of moderate degree ?  Colitis ? ?,Marilyn Sharp 86 y.o. 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  She has recurrence of left lower quadrant pain associated with bloody diarrhea.  Stool studies on admission revealed enterotoxigenic E. coli infection.  Currently on Zosyn.  Stool for C. difficile negative.Due to recurrence of symptoms and left-sided colitis a colonoscopy was performed and showed features of inflammation that were patchy but were seen from the transverse colon all the way up to the sigmoid colon sparing the rectum.  These were not typical of inflammatory bowel disease.  High on my differential would be ischemic colitis followed by infectious colitis and then Crohn's disease.  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  Biopsies elevated and pending.  In the meanwhile continue supportive care and advance diet as tolerated.  11/04/2021 hemoglobin 12 gCreatinine 1.45 improved.  Follow-up with vascular surgery Recs ? ? LOS: 4  days  ? ?Marilyn Bellows, MD ?11/04/2021, 8:21 AM  ?

## 2021-11-04 NOTE — Progress Notes (Signed)
Physical Therapy Treatment ?Patient Details ?Name: Marilyn Sharp ?MRN: [id number] ?DOB: April 14, 1936 ?Today's Date: 11/04/2021 ? ? ?History of Present Illness Ms. Xaviera Tretter is an 86 year old female with with history of depression, anxiety, hyperlipidemia, hypertension, CKD 3B, GERD, MGUS, who presents emergency department for chief concerns of persistent colitis. ? ?  ?PT Comments  ? ? Pt is making gradual progress towards goals with ability to ambulate in hallway with RW. Pt needs encouragement to participate and fatigues quickly with OOB mobility. Pt has purewick and reports she isn't able to make it to the bathroom in time and request it to be replaced. Does demonstrate L LE weakness, encouraged to continue HEP and mobilize as able. Will continue to progress as able.   ?Recommendations for follow up therapy are one component of a multi-disciplinary discharge planning process, led by the attending physician.  Recommendations may be updated based on patient status, additional functional criteria and insurance authorization. ? ?Follow Up Recommendations ? Home health PT ?  ?  ?Assistance Recommended at Discharge Set up Supervision/Assistance  ?Patient can return home with the following A little help with walking and/or transfers;A little help with bathing/dressing/bathroom;Help with stairs or ramp for entrance ?  ?Equipment Recommendations ? Rolling walker (2 wheels)  ?  ?Recommendations for Other Services   ? ? ?  ?Precautions / Restrictions Precautions ?Precautions: Fall ?Restrictions ?Weight Bearing Restrictions: No  ?  ? ?Mobility ? Bed Mobility ?Overal bed mobility: Needs Assistance ?Bed Mobility: Supine to Sit ?  ?  ?Supine to sit: Min guard ?  ?  ?General bed mobility comments: needs extra time for L LE and cga for lifting L back into bed. Cues for repositioning ?  ? ?Transfers ?Overall transfer level: Needs assistance ?Equipment used: Rolling walker (2 wheels) ?Transfers: Sit to/from Stand ?Sit to Stand: Min  assist ?  ?  ?  ?  ?  ?General transfer comment: cues for scooting out towards EOB. Cues for hand placement and min assist for lift off. Once standing, upright posture noted ?  ? ?Ambulation/Gait ?Ambulation/Gait assistance: Min guard ?Gait Distance (Feet): 120 Feet ?Assistive device: Rolling walker (2 wheels) ?Gait Pattern/deviations: Step-through pattern ?  ?  ?  ?General Gait Details: ambulated in hallway with very slow gait pattern and cues for L stepping. Tends to drag in several instances, however with attending to task improves gait symmetry. Fatigues quickly with 1-2 standing rest break. Slight dizziness noted, however doesn't appear to limit mobility attempts ? ? ?Stairs ?  ?  ?  ?  ?  ? ? ?Wheelchair Mobility ?  ? ?Modified Rankin (Stroke Patients Only) ?  ? ? ?  ?Balance Overall balance assessment: Needs assistance ?Sitting-balance support: No upper extremity supported, Feet supported ?Sitting balance-Leahy Scale: Good ?  ?  ?Standing balance support: Bilateral upper extremity supported, During functional activity ?Standing balance-Leahy Scale: Fair ?  ?  ?  ?  ?  ?  ?  ?  ?  ?  ?  ?  ?  ? ?  ?Cognition Arousal/Alertness: Awake/alert ?Behavior During Therapy: [facility] for tasks assessed/performed ?Overall Cognitive Status: Within Functional Limits for tasks assessed ?  ?  ?  ?  ?  ?  ?  ?  ?  ?  ?  ?  ?  ?  ?  ?  ?General Comments: very pleasant, however need encouragement to participate ?  ?  ? ?  ?Exercises   ? ?  ?General Comments   ?  ?  ? ?  Pertinent Vitals/Pain Pain Assessment ?Pain Assessment: 0-10 ?Pain Score: 3  ?Pain Location: L LE ?Pain Descriptors / Indicators: Heaviness ?Pain Intervention(s): Limited activity within patient's tolerance, Repositioned  ? ? ?Home Living   ?  ?  ?  ?  ?  ?  ?  ?  ?  ?   ?  ?Prior Function    ?  ?  ?   ? ?PT Goals (current goals can now be found in the care plan section) Acute Rehab PT Goals ?Patient Stated Goal: to go home. ?PT Goal Formulation: With patient ?Time For  Goal Achievement: 11/15/21 ?Potential to Achieve Goals: Good ?Progress towards PT goals: Progressing toward goals ? ?  ?Frequency ? ? ? Min 2X/week ? ? ? ?  ?PT Plan Current plan remains appropriate  ? ? ?Co-evaluation   ?  ?  ?  ?  ? ?  ?AM-PAC PT "6 Clicks" Mobility   ?Outcome Measure ? Help needed turning from your back to your side while in a flat bed without using bedrails?: A Little ?Help needed moving from lying on your back to sitting on the side of a flat bed without using bedrails?: A Little ?Help needed moving to and from a bed to a chair (including a wheelchair)?: A Little ?Help needed standing up from a chair using your arms (e.g., wheelchair or bedside chair)?: A Little ?Help needed to walk in hospital room?: A Little ?Help needed climbing 3-5 steps with a railing? : A Lot ?6 Click Score: 17 ? ?  ?End of Session Equipment Utilized During Treatment: Gait belt ?Activity Tolerance: Patient limited by fatigue ?Patient left: in bed;with bed alarm set ?Nurse Communication: Mobility status ?PT Visit Diagnosis: Unsteadiness on feet (R26.81);Other abnormalities of gait and mobility (R26.89);Repeated falls (R29.6);Muscle weakness (generalized) (M62.81);History of falling (Z91.81);Difficulty in walking, not elsewhere classified (R26.2) ?  ? ? ?Time: 4360-6770 ?PT Time Calculation (min) (ACUTE ONLY): 24 min ? ?Charges:  $Gait Training: 23-37 mins          ?          ? ?[name], PT, DPT, GCS ?[phone] ? ? ? ?Jarid Sasso ?11/04/2021, 4:28 PM ? ?

## 2021-11-04 NOTE — Progress Notes (Signed)
?  Progress Note ? ? ?Patient: Marilyn Sharp YPP:[id number] DOB: July 24, 1936 DOA: 10/30/2021     4 ?DOS: the patient was seen and examined on 11/04/2021 ?  ?Brief hospital course: ?[name] is an 86 year old female with with history of depression, anxiety, hyperlipidemia, hypertension, CKD 3B, GERD, MGUS, who presents emergency department for chief concerns of persistent colitis. ? ?Initial vitals in the emergency department show temperature of 98.8, respiration rate of 16, heart rate 81, blood pressure 149/87, SPO2 of 96% on room air. ? ?Serum sodium 136, potassium 3.6, chloride 99, bicarb 26, BUN 59, serum creatinine of 2.60, nonfasting blood glucose 118, GFR 18, WBC 22.6, hemoglobin 13.2, platelets of 512. ? ?UA was negative for leukocytes and nitrates. ? ?ED treatment: IV Zosyn 3.375 mg one-time dose, sodium chloride 500 mL bolus. ? ?Assessment and Plan: ?* AKI (acute kidney injury) ([place]) ?- Presumed secondary to contrast nephropathy in setting of recent CT studies involving contrast ?-Cr down to 1.45 ?-Cont on IVF ? ?Leukocytosis ?-Likely secondary to above colitis ?-WBCstable at 16.4k ?- CBC in the a.m. ? ?Enterotoxigenic Escherichia coli infection ?- GI and C. difficile PCR panel reviewed. CDiff is neg. Stool study is pos for ETEC ?-Given hx of recurrent colitis, GI was consulted at time of presentation ?-Continued on empiric zosyn ?-GI following. Pt underwent colonoscopy with findings of patchy bullous blebs and colitis, appearing ischemic. 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 ?Acute kidney failure, unspecified ([place]) ?-Renal US performed, reviewed. No hydronephrosis. -Possible non-obstructive calculus in mid-pole ?-Pt is s/p recent CT studies involving contrast  ?-CT abd/pelvis 3/22 reviewed. Findings suggestive of ATN vs contrast nephropathy ?-Will cont to  avoid nephrotoxic agents. Cont IVF hydration ?-Cr continues to trend down ?-Repeat bmet in AM ? ?Benign essential HTN ?- Cont hydralazine '50mg'$  q8h ?- Hydralazine 10 mg p.o. every 6 hours as needed for SBP greater than 175 ? ?HTN (hypertension) ?Continued on hydralazine at '50mg'$  q8h ?Cont PRN hydralazine as needed ? ? ? ? ?  ? ?Subjective: Complains of lack of appetitie ? ?Physical Exam: ?Vitals:  ? 11/03/21 1916 11/04/21 0252 11/04/21 0834 11/04/21 1225  ?BP: 123/60 (!) 142/77 (!) 151/76 (!) 142/65  ?Pulse: 85 75 94 86  ?Resp: 18 16    ?Temp: 98.4 ?F (36.9 ?C) 98.2 ?F (36.8 ?C) 98.3 ?F (36.8 ?C) 98.4 ?F (36.9 ?C)  ?TempSrc:  Oral Oral Oral  ?SpO2: 96% 93% 94% 96%  ?Weight:      ?Height:      ? ?General exam: Conversant, in no acute distress ?Respiratory system: normal chest rise, clear, no audible wheezing ?Cardiovascular system: regular rhythm, s1-s2 ?Gastrointestinal system: Nondistended, nontender, pos BS ?Central nervous system: No seizures, no tremors ?Extremities: No cyanosis, no joint deformities ?Skin: No rashes, no pallor ?Psychiatry: Affect normal // no auditory hallucinations  ? ?Data Reviewed: ? ?Labs reviewed, Cr 1.45, potassium 3.7 ? ?Family Communication: Pt in room ? ?Disposition: ?Status is: Inpatient ?Continue inpatient stay because: Severity of illness needing IVF ? Planned Discharge Destination: Home ? ? ? ?Author: ?Marilyn Lund, MD ?11/04/2021 2:01 PM ? ?For on call review [email or web address].  ?

## 2021-11-04 NOTE — Progress Notes (Signed)
Nutrition Follow-up ? ?DOCUMENTATION CODES:  ? ?Non-severe (moderate) malnutrition in context of social or environmental circumstances ? ?INTERVENTION:  ? ?-D/c Boost Breeze po TID, each supplement provides 250 kcal and 9 grams of protein  ?-Ensure Enlive po TID, each supplement provides 350 kcal and 20 grams of protein ?-MVI with minerals daily ? ?NUTRITION DIAGNOSIS:  ? ?Moderate Malnutrition related to social / environmental circumstances (advanced age, poor oral intake) as evidenced by moderate fat depletion, moderate muscle depletion. ? ?Ongoing ? ?GOAL:  ? ?Patient will meet greater than or equal to 90% of their needs ? ?Progressing  ? ?MONITOR:  ? ?Labs, Weight trends, Skin, I & O's, Diet advancement ? ?REASON FOR ASSESSMENT:  ? ?Malnutrition Screening Tool ?  ? ?ASSESSMENT:  ? ?86 y/o female with h/o MDD, anxiety, HLD, HTN, CKD III, MGUS and GERD who is admitted with colitis. ? ?3/26- s/p colonoscopy- revealed one polyp in colon (removed), normal mucosa in the rectum and in the ascending colon (biopsied), and mucosal ulceration (biopsied); advanced to soft diet ? ?Reviewed I/O's: +2 L x 24 hours and +6.5 L since admission ? ?UOP: 0 ml x 24 hours  ? ?Per GI notes, colonoscopy suspicious for ischemic colitis. Vascular surgery following.  ? ?Pt currently on soft diet with poor oral intake. Noted meal completions 20%. Pt is drinking Boost Breeze supplements. RD will change supplement to Ensure, as this is pt's preferred supplement.  ? ?Medications reviewed and include vitamin B-12 and 0.9% sodium chloride infusion @ 75 ml/hr.  ? ?Labs reviewed.  ? ?Diet Order:   ?Diet Order   ? ?       ?  DIET SOFT Room service appropriate? Yes; Fluid consistency: Thin  Diet effective now       ?  ? ?  ?  ? ?  ? ? ?EDUCATION NEEDS:  ? ?Education needs have been addressed ? ?Skin:  Skin Assessment: Reviewed RN Assessment ? ?Last BM:  3/23- type 4 ? ?Height:  ? ?Ht Readings from Last 1 Encounters:  ?11/01/21 '5\' 5"'$  (1.651 m)   ? ? ?Weight:  ? ?Wt Readings from Last 1 Encounters:  ?11/01/21 68.3 kg  ? ? ?Ideal Body Weight:  56.8 kg ? ?BMI:  Body mass index is 25.06 kg/m?. ? ?Estimated Nutritional Needs:  ? ?Kcal:  1400-1600kcal/day ? ?Protein:  70-80g/day ? ?Fluid:  1.4-1.6L/day ? ? ? ?Loistine Chance, RD, LDN, CDCES ?Registered Dietitian II ?Certified Diabetes Care and Education Specialist ?Please refer to [facility] for RD and/or RD on-call/weekend/after hours pager  ?

## 2021-11-04 NOTE — Care Management Important Message (Signed)
Important Message ? ?Patient Details  ?Name: Marilyn Sharp ?MRN: [id number] ?Date of Birth: [birth year] ? ? ?Medicare Important Message Given:  Yes ? ? ? ? ?[name] ?11/04/2021, 12:20 PM ?

## 2021-11-04 NOTE — TOC Progression Note (Signed)
Transition of Care (TOC) - Progression Note  ? ? ?Patient Details  ?Name: Marilyn Sharp ?MRN: [id number] ?Date of Birth: 1936/05/17 ? ?Transition of Care (TOC) CM/SW Contact  ?Beverly Sessions, RN ?Phone Number: ?11/04/2021, 12:47 PM ? ?Clinical Narrative:    ? ?Seth Bake at [facility] notified that per MD discharge is not anticipated today ? ?Expected Discharge Plan: Home/Self Care ?Barriers to Discharge: Continued Medical Work up ? ?Expected Discharge Plan and Services ?Expected Discharge Plan: Home/Self Care ?  ?  ?  ?Living arrangements for the past 2 months: [place] ?                ?  ?  ?  ?  ?  ?  ?  ?  ?  ?  ? ? ?Social Determinants of Health (SDOH) Interventions ?  ? ?Readmission Risk Interventions ?   ? View : No data to display.  ?  ?  ?  ? ? ?

## 2021-11-05 DIAGNOSIS — K529 Noninfective gastroenteritis and colitis, unspecified: Secondary | ICD-10-CM | POA: Diagnosis not present

## 2021-11-05 DIAGNOSIS — I10 Essential (primary) hypertension: Secondary | ICD-10-CM | POA: Diagnosis not present

## 2021-11-05 DIAGNOSIS — N179 Acute kidney failure, unspecified: Secondary | ICD-10-CM | POA: Diagnosis not present

## 2021-11-05 LAB — SURGICAL PATHOLOGY

## 2021-11-05 LAB — COMPREHENSIVE METABOLIC PANEL
ALT: 8 U/L (ref 0–44)
AST: 10 U/L — ABNORMAL LOW (ref 15–41)
Albumin: 2.5 g/dL — ABNORMAL LOW (ref 3.5–5.0)
Alkaline Phosphatase: 39 U/L (ref 38–126)
Anion gap: 6 (ref 5–15)
BUN: 20 mg/dL (ref 8–23)
CO2: 19 mmol/L — ABNORMAL LOW (ref 22–32)
Calcium: 7.6 mg/dL — ABNORMAL LOW (ref 8.9–10.3)
Chloride: 119 mmol/L — ABNORMAL HIGH (ref 98–111)
Creatinine, Ser: 1.33 mg/dL — ABNORMAL HIGH (ref 0.44–1.00)
GFR, Estimated: 40 mL/min — ABNORMAL LOW (ref >60)
Glucose, Bld: 94 mg/dL (ref 70–99)
Potassium: 3.6 mmol/L (ref 3.5–5.1)
Sodium: 144 mmol/L (ref 135–145)
Total Bilirubin: 0.6 mg/dL (ref 0.3–1.2)
Total Protein: 4.8 g/dL — ABNORMAL LOW (ref 6.5–8.1)

## 2021-11-05 LAB — CBC
HCT: 37.8 % (ref 36.0–46.0)
Hemoglobin: 11.7 g/dL — ABNORMAL LOW (ref 12.0–15.0)
MCH: 28 pg (ref 26.0–34.0)
MCHC: 31 g/dL (ref 30.0–36.0)
MCV: 90.4 fL (ref 80.0–100.0)
Platelets: 451 10*3/uL — ABNORMAL HIGH (ref 150–400)
RBC: 4.18 MIL/uL (ref 3.87–5.11)
RDW: 15.7 % — ABNORMAL HIGH (ref 11.5–15.5)
WBC: 17.1 10*3/uL — ABNORMAL HIGH (ref 4.0–10.5)
nRBC: 0 % (ref 0.0–0.2)

## 2021-11-05 NOTE — Progress Notes (Signed)
Physical Therapy Treatment ?Patient Details ?Name: Marilyn Sharp ?MRN: [id number] ?DOB: 06/30/1936 ?Today's Date: 11/05/2021 ? ? ?History of Present Illness Marilyn Sharp is an 86 year old female with with history of depression, anxiety, hyperlipidemia, hypertension, CKD 3B, GERD, MGUS, who presents emergency department for chief concerns of persistent colitis. ? ?  ?PT Comments  ? ? Pt is making gradual progress towards goals, however still limited by weakness. Reports increased edema, MD aware. Pillows placed under legs at end of session. RW used and still requires min assist for mobility tasks. Updated recs to SNF as she is currently off baseline. Updated TOC.  ?Recommendations for follow up therapy are one component of a multi-disciplinary discharge planning process, led by the attending physician.  Recommendations may be updated based on patient status, additional functional criteria and insurance authorization. ? ?Follow Up Recommendations ? Skilled nursing-short term rehab (<3 hours/day) ?  ?  ?Assistance Recommended at Discharge Intermittent Supervision/Assistance  ?Patient can return home with the following A little help with walking and/or transfers;A little help with bathing/dressing/bathroom;Help with stairs or ramp for entrance ?  ?Equipment Recommendations ? Rolling walker (2 wheels)  ?  ?Recommendations for Other Services   ? ? ?  ?Precautions / Restrictions Precautions ?Precautions: Fall ?Restrictions ?Weight Bearing Restrictions: No  ?  ? ?Mobility ? Bed Mobility ?  ?  ?  ?  ?  ?  ?  ?General bed mobility comments: not performed as received seated at EOB ?  ? ?Transfers ?Overall transfer level: Needs assistance ?Equipment used: 1 person hand held assist ?Transfers: Bed to chair/wheelchair/BSC ?  ?Stand pivot transfers: Min assist ?  ?  ?  ?  ?General transfer comment: cues for safety and hand placement. Further STS performed with RW with min assist for terminal extension. ?   ? ?Ambulation/Gait ?Ambulation/Gait assistance: Min guard ?Gait Distance (Feet): 40 Feet ?Assistive device: Rolling walker (2 wheels) ?Gait Pattern/deviations: Step-through pattern ?  ?  ?  ?General Gait Details: agreeable to ambulate in room due to increased swelling noted in B LEs causing heaviness. RW used with cues for sequencing. Very slow gait speed noted ? ? ?Stairs ?  ?  ?  ?  ?  ? ? ?Wheelchair Mobility ?  ? ?Modified Rankin (Stroke Patients Only) ?  ? ? ?  ?Balance Overall balance assessment: Needs assistance ?Sitting-balance support: No upper extremity supported, Feet supported ?Sitting balance-Leahy Scale: Good ?  ?  ?Standing balance support: Bilateral upper extremity supported, During functional activity ?Standing balance-Leahy Scale: Fair ?  ?  ?  ?  ?  ?  ?  ?  ?  ?  ?  ?  ?  ? ?  ?Cognition Arousal/Alertness: Awake/alert ?Behavior During Therapy: [facility] for tasks assessed/performed ?Overall Cognitive Status: Within Functional Limits for tasks assessed ?  ?  ?  ?  ?  ?  ?  ?  ?  ?  ?  ?  ?  ?  ?  ?  ?General Comments: needs encouragement to performed. LImited by fear/anxiety with movement ?  ?  ? ?  ?Exercises Other Exercises ?Other Exercises: transfer to [facility] with min assist. Cues for performing hygiene. Able to perform self hygiene with increased encouragement and supervision ? ?  ?General Comments   ?  ?  ? ?Pertinent Vitals/Pain Pain Assessment ?Pain Assessment: No/denies pain  ? ? ?Home Living   ?  ?  ?  ?  ?  ?  ?  ?  ?  ?   ?  ?  Prior Function    ?  ?  ?   ? ?PT Goals (current goals can now be found in the care plan section) Acute Rehab PT Goals ?Patient Stated Goal: to go home. ?PT Goal Formulation: With patient ?Time For Goal Achievement: 11/15/21 ?Potential to Achieve Goals: Good ?Progress towards PT goals: Progressing toward goals ? ?  ?Frequency ? ? ? Min 2X/week ? ? ? ?  ?PT Plan Discharge plan needs to be updated  ? ? ?Co-evaluation   ?  ?  ?  ?  ? ?  ?AM-PAC PT "6 Clicks" Mobility    ?Outcome Measure ? Help needed turning from your back to your side while in a flat bed without using bedrails?: A Little ?Help needed moving from lying on your back to sitting on the side of a flat bed without using bedrails?: A Little ?Help needed moving to and from a bed to a chair (including a wheelchair)?: A Little ?Help needed standing up from a chair using your arms (e.g., wheelchair or bedside chair)?: A Little ?Help needed to walk in hospital room?: A Little ?Help needed climbing 3-5 steps with a railing? : A Lot ?6 Click Score: 17 ? ?  ?End of Session Equipment Utilized During Treatment: Gait belt ?Activity Tolerance: Patient limited by fatigue ?Patient left: in chair;with family/visitor present ?Nurse Communication: Mobility status ?PT Visit Diagnosis: Unsteadiness on feet (R26.81);Other abnormalities of gait and mobility (R26.89);Repeated falls (R29.6);Muscle weakness (generalized) (M62.81);History of falling (Z91.81);Difficulty in walking, not elsewhere classified (R26.2) ?  ? ? ?Time: 6384-5364 ?PT Time Calculation (min) (ACUTE ONLY): 23 min ? ?Charges:  $Gait Training: 8-22 mins ?$Therapeutic Activity: 8-22 mins          ?          ? ?[name], PT, DPT, GCS ?[phone] ? ? ? ?[name] ?11/05/2021, 1:01 PM ? ?

## 2021-11-05 NOTE — Plan of Care (Signed)
? ?  Problem: Elimination: ?Goal: Will not experience complications related to bowel motility ?Outcome: Not Progressing ?  ?Problem: Safety: ?Goal: Ability to remain free from injury will improve ?Outcome: Not Progressing ?  ?

## 2021-11-05 NOTE — H&P (Addendum)
? ?Marilyn Sharp , MD ?[address] [place], [place], [place], [address] ?[address], [place], [place], [place], [address] ?Phone: [phone]  ?Fax: [phone] ? ? ?[name] is being followed for colitis ? ?Subjective: ?No abdominal pain, no vomiting.  1 bowel movement today and 2 bowel movements yesterday the amount of blood in the stool is decreasing. ? ?Objective: ?Vital signs in last 24 hours: ?Vitals:  ? 11/04/21 1600 11/04/21 1932 11/05/21 0621 11/05/21 0805  ?BP: (!) 145/65 (!) 167/85 (!) 148/71 (!) 153/75  ?Pulse: 68 81 71 76  ?Resp:  '18 18 16  '$ ?Temp: 98.6 ?F (37 ?C) 97.7 ?F (36.5 ?C) 98.5 ?F (36.9 ?C) 97.7 ?F (36.5 ?C)  ?TempSrc: Oral Oral Oral Oral  ?SpO2: 95% 96% 93% 95%  ?Weight:      ?Height:      ? ?Weight change:  ? ?Intake/Output Summary (Last 24 hours) at 11/05/2021 0924 ?Last data filed at 11/05/2021 0601 ?Gross per 24 hour  ?Intake 2617.45 ml  ?Output 500 ml  ?Net 2117.45 ml  ? ? ? ?Exam: ? ?Abdomen: soft, nontender, normal bowel sounds ? ? ?Lab Results: ?'@LABTEST2'$ @ ?Micro Results: ?Recent Results (from the past 240 hour(s))  ?Gastrointestinal Panel by PCR , Stool     Status: Abnormal  ? Collection Time: 10/31/21  8:20 AM  ? Specimen: Stool  ?Result Value Ref Range Status  ? Campylobacter species NOT DETECTED NOT DETECTED Final  ? Plesimonas shigelloides NOT DETECTED NOT DETECTED Final  ? Salmonella species NOT DETECTED NOT DETECTED Final  ? Yersinia enterocolitica NOT DETECTED NOT DETECTED Final  ? Vibrio species NOT DETECTED NOT DETECTED Final  ? Vibrio cholerae NOT DETECTED NOT DETECTED Final  ? Enteroaggregative E coli (EAEC) NOT DETECTED NOT DETECTED Final  ? Enteropathogenic E coli (EPEC) NOT DETECTED NOT DETECTED Final  ? Enterotoxigenic E coli (ETEC) DETECTED (A) NOT DETECTED Final  ?  Comment: RESULT CALLED TO, READ BACK BY AND VERIFIED WITH: ?JASMYNE DAVIS 10/31/21 1012 MW ?  ? Shiga like toxin producing E coli (STEC) NOT DETECTED NOT DETECTED Final  ? Shigella/Enteroinvasive E  coli (EIEC) NOT DETECTED NOT DETECTED Final  ? Cryptosporidium NOT DETECTED NOT DETECTED Final  ? Cyclospora cayetanensis NOT DETECTED NOT DETECTED Final  ? Entamoeba histolytica NOT DETECTED NOT DETECTED Final  ? Giardia lamblia NOT DETECTED NOT DETECTED Final  ? Adenovirus F40/41 NOT DETECTED NOT DETECTED Final  ? Astrovirus NOT DETECTED NOT DETECTED Final  ? Norovirus GI/GII NOT DETECTED NOT DETECTED Final  ? Rotavirus A NOT DETECTED NOT DETECTED Final  ? Sapovirus (I, II, IV, and V) NOT DETECTED NOT DETECTED Final  ?  Comment: Performed at [facility], [address] [place], [place] [address]  ?C Difficile Quick Screen w PCR reflex     Status: None  ? Collection Time: 10/31/21  8:25 AM  ? Specimen: Stool  ?Result Value Ref Range Status  ? C Diff antigen NEGATIVE NEGATIVE Final  ? C Diff toxin NEGATIVE NEGATIVE Final  ? C Diff interpretation No C. difficile detected.  Final  ?  Comment: Performed at [facility], [address] [place],  [address]  ? ?Studies/Results: ?[place] MESENTERIC ARTERIES ? ?Result Date: 11/03/2021 ?CLINICAL DATA:  Ischemic colitis EXAM: [place] MESENTERIC ARTERIAL DOPPLER COMPARISON:  CT abdomen pelvis without contrast 10/30/2021 FINDINGS: Celiac axis: 280 cm/sec Celiac axis with inspiration: 260 cm/sec Celiac axis with expiration: 254 cm/sec Splenic artery: 80 cm/sec Hepatic artery: 73 cm/sec SMA: 129-207  cm/sec IMA: 31 cm/sec Aorta: 101 cm/sec Aortic size: 3.1 cm proximally, 2.0 cm in the mid segment and 1.7 cm distally Scattered calcified plaque seen throughout the abdominal aorta. 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 Electronically Signed   By: Miachel Roux M.D.   On: 11/03/2021 15:54   ?Medications: I have reviewed the patient's current medications. ?Scheduled Meds: ? feeding supplement  237 mL Oral TID BM  ? heparin injection (subcutaneous)  5,000 Units Subcutaneous Q8H  ? hydrALAZINE  50 mg Oral Q8H  ?  multivitamin with minerals  1 tablet Oral Daily  ? vitamin B-12  500 mcg Oral Daily  ? ?Continuous Infusions: ? sodium chloride 75 mL/hr at 11/05/21 0231  ? sodium chloride 10 mL/hr at 11/05/21 0233  ? sodium chloride Stopped (11/03/21 0438)  ? piperacillin-tazobactam (ZOSYN)  IV 3.375 g (11/05/21 0601)  ? ?PRN Meds:.sodium chloride, acetaminophen, hydrALAZINE, ondansetron **OR** ondansetron (ZOFRAN) IV ? ? ?Assessment: ?Principal Problem: ?  AKI (acute kidney injury) ([place]) ?Active Problems: ?  Major depression in remission [facility]) ?  GERD (gastroesophageal reflux disease) ?  HTN (hypertension) ?  Carotid stenosis, asymptomatic, minimal ?  Benign essential HTN ?  Acute kidney failure, unspecified ([place]) ?  Chronic kidney disease, stage 3 unspecified ([place]) ?  Monoclonal gammopathy ?  Enterotoxigenic Escherichia coli infection ?  Leukocytosis ?  Malnutrition of moderate degree ?  Colitis ? ?Marilyn Sharp 86 y.o. 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  She has recurrence of left lower quadrant pain associated with bloody diarrhea.  Stool studies on admission revealed enterotoxigenic E. coli infection.  Currently on Zosyn.  Stool for C. difficile negative.Due to recurrence of symptoms and left-sided colitis a colonoscopy was performed and showed features of inflammation that were patchy but were seen from the transverse colon all the way up to the sigmoid colon sparing the rectum.  These were not typical of inflammatory bowel disease.  High on my differential would be ischemic colitis followed by infectious colitis or a combination.  Biopsies suggest features of infectious colitis as well as ischemic colitis. ? ? 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 ? Significance.  Features of ischemic  colitis seen on biopsies. ? ?2. Advance diet as tolerated, I have informed nursing.  11/04/2021 hemoglobin 12 gCreatinine 1.45 improved.  Follow-up with vascular surgery Recs.  Complete 7 days of antibiotics. ? ? ? ? LOS: 5 days  ? ?Marilyn Bellows, MD ?11/05/2021, 9:24 AM  ?

## 2021-11-05 NOTE — Progress Notes (Signed)
?Progress Note ? ? ?Patient: Marilyn Sharp FOY:[id number] DOB: [birth year] DOA: 10/30/2021     5 ?DOS: the patient was seen and examined on 11/05/2021 ?  ?Brief hospital course: ?[name] is an 86 year old female with with history of depression, anxiety, hyperlipidemia, hypertension, CKD 3B, GERD, MGUS, who presents emergency department for chief concerns of persistent colitis. ? ?Initial vitals in the emergency department show temperature of 98.8, respiration rate of 16, heart rate 81, blood pressure 149/87, SPO2 of 96% on room air. ? ?Serum sodium 136, potassium 3.6, chloride 99, bicarb 26, BUN 59, serum creatinine of 2.60, nonfasting blood glucose 118, GFR 18, WBC 22.6, hemoglobin 13.2, platelets of 512. ? ?UA was negative for leukocytes and nitrates. ? ?ED treatment: IV Zosyn 3.375 mg one-time dose, sodium chloride 500 mL bolus. ? ?Assessment and Plan: ?* AKI (acute kidney injury) ([place]) ?- Presumed secondary to contrast nephropathy in setting of recent CT studies involving contrast ?-Cr down to 1.45 ?-Cont on IVF ? ?Leukocytosis ?-Likely secondary to above colitis ?-WBCstable at 17.1 ?- CBC in the a.m. ? ?Enterotoxigenic Escherichia coli infection ?- GI and C. difficile PCR panel reviewed. CDiff is neg. Stool study is pos for ETEC ?-Given hx of recurrent colitis, GI was consulted at time of presentation ?-Continued on empiric zosyn ?-GI following. Pt underwent colonoscopy with findings of patchy bullous blebs and colitis, appearing ischemic. 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 Vascular surgery had recommended CT angio once renal function normalizes ?-GI recs for cont supportive care and advance diet as tolerated ? ?Acute kidney failure, unspecified ([place]) ?-Renal US performed, reviewed. No hydronephrosis. -Possible non-obstructive calculus in mid-pole ?-Pt is s/p recent CT studies involving contrast  ?-CT abd/pelvis 3/22  reviewed. Findings suggestive of ATN vs contrast nephropathy ?-Will cont to avoid nephrotoxic agents. ?-Cr has improved to 1.33 with IVF. BLE now edematous with most recent wt of 68kg up from [place] on 3/18. Check 2d echo ?-Pt reporting ample urine output. Will hold IVF for now ? ?Benign essential HTN ?- Cont hydralazine '50mg'$  q8h ?- Hydralazine 10 mg p.o. every 6 hours as needed for SBP greater than 175 ? ?HTN (hypertension) ?Continued on hydralazine at '50mg'$  q8h ?Cont PRN hydralazine as needed ?BP trends stable ? ? ? ? ?  ? ?Subjective: Reporting B LE edema and L hand swelling. Reports voiding very well ? ?Physical Exam: ?Vitals:  ? 11/04/21 1600 11/04/21 1932 11/05/21 0621 11/05/21 0805  ?BP: (!) 145/65 (!) 167/85 (!) 148/71 (!) 153/75  ?Pulse: 68 81 71 76  ?Resp:  '18 18 16  '$ ?Temp: 98.6 ?F (37 ?C) 97.7 ?F (36.5 ?C) 98.5 ?F (36.9 ?C) 97.7 ?F (36.5 ?C)  ?TempSrc: Oral Oral Oral Oral  ?SpO2: 95% 96% 93% 95%  ?Weight:      ?Height:      ? ?General exam: Awake, laying in bed, in nad ?Respiratory system: Normal respiratory effort, no wheezing ?Cardiovascular system: regular rate, s1, s2 ?Gastrointestinal system: Soft, nondistended, positive BS ?Central nervous system: CN2-12 grossly intact, strength intact ?Extremities: Perfused, no clubbing ?Skin: Normal skin turgor, no notable skin lesions seen ?Psychiatry: Mood normal // no visual hallucinations  ? ?Data Reviewed: ? ?Labs reviewed, Cr 1.33 ? ?Family Communication: Pt in room, family at bedside ? ?Disposition: ?Status is: Inpatient ?Continue inpatient stay because: Severity of illness needing IVF ? Planned Discharge Destination: Skilled nursing facility ? ? ? ?Author: ?Marylu Lund,  MD ?11/05/2021 2:10 PM ? ?For on call review [email or web address].  ?

## 2021-11-05 NOTE — TOC Progression Note (Signed)
Transition of Care (TOC) - Progression Note  ? ? ?Patient Details  ?Name: Marilyn Sharp ?MRN: [id number] ?Date of Birth: 09/27/1935 ? ?Transition of Care (TOC) CM/SW Contact  ?Beverly Sessions, RN ?Phone Number: ?11/05/2021, 12:41 PM ? ?Clinical Narrative:    ? ?Not medically ready for discharge.  Seth Bake at [facility] updated ? ?Expected Discharge Plan: Home/Self Care ?Barriers to Discharge: Continued Medical Work up ? ?Expected Discharge Plan and Services ?Expected Discharge Plan: Home/Self Care ?  ?  ?  ?Living arrangements for the past 2 months: [place] ?                ?  ?  ?  ?  ?  ?  ?  ?  ?  ?  ? ? ?Social Determinants of Health (SDOH) Interventions ?  ? ?Readmission Risk Interventions ?   ? View : No data to display.  ?  ?  ?  ? ? ?

## 2021-11-06 ENCOUNTER — Inpatient Hospital Stay (HOSPITAL_COMMUNITY)
Admit: 2021-11-06 | Discharge: 2021-11-06 | Disposition: A | Discharge status: Home / Self Care | Payer: Medicare Other | Attending: Internal Medicine | Admitting: Internal Medicine

## 2021-11-06 DIAGNOSIS — I5031 Acute diastolic (congestive) heart failure: Secondary | ICD-10-CM | POA: Diagnosis not present

## 2021-11-06 DIAGNOSIS — K529 Noninfective gastroenteritis and colitis, unspecified: Secondary | ICD-10-CM | POA: Diagnosis not present

## 2021-11-06 DIAGNOSIS — N179 Acute kidney failure, unspecified: Secondary | ICD-10-CM | POA: Diagnosis not present

## 2021-11-06 LAB — ECHOCARDIOGRAM COMPLETE
AR max vel: 2.2 cm2
AV Area VTI: 2.37 cm2
AV Area mean vel: 2.32 cm2
AV Mean grad: 10 mmHg
AV Peak grad: 19.3 mmHg
Ao pk vel: 2.2 m/s
Area-P 1/2: 3.79 cm2
Height: 65 in
MV VTI: 2.53 cm2
S' Lateral: 2.42 cm
Weight: 2409.19 oz

## 2021-11-06 LAB — CBC
HCT: 37.4 % (ref 36.0–46.0)
Hemoglobin: 11.8 g/dL — ABNORMAL LOW (ref 12.0–15.0)
MCH: 28.6 pg (ref 26.0–34.0)
MCHC: 31.6 g/dL (ref 30.0–36.0)
MCV: 90.8 fL (ref 80.0–100.0)
Platelets: 471 10*3/uL — ABNORMAL HIGH (ref 150–400)
RBC: 4.12 MIL/uL (ref 3.87–5.11)
RDW: 15.9 % — ABNORMAL HIGH (ref 11.5–15.5)
WBC: 18.4 10*3/uL — ABNORMAL HIGH (ref 4.0–10.5)
nRBC: 0 % (ref 0.0–0.2)

## 2021-11-06 LAB — COMPREHENSIVE METABOLIC PANEL
ALT: 9 U/L (ref 0–44)
AST: 11 U/L — ABNORMAL LOW (ref 15–41)
Albumin: 2.4 g/dL — ABNORMAL LOW (ref 3.5–5.0)
Alkaline Phosphatase: 40 U/L (ref 38–126)
Anion gap: 6 (ref 5–15)
BUN: 21 mg/dL (ref 8–23)
CO2: 20 mmol/L — ABNORMAL LOW (ref 22–32)
Calcium: 7.9 mg/dL — ABNORMAL LOW (ref 8.9–10.3)
Chloride: 115 mmol/L — ABNORMAL HIGH (ref 98–111)
Creatinine, Ser: 1.24 mg/dL — ABNORMAL HIGH (ref 0.44–1.00)
GFR, Estimated: 43 mL/min — ABNORMAL LOW (ref >60)
Glucose, Bld: 94 mg/dL (ref 70–99)
Potassium: 3.8 mmol/L (ref 3.5–5.1)
Sodium: 141 mmol/L (ref 135–145)
Total Bilirubin: 0.6 mg/dL (ref 0.3–1.2)
Total Protein: 4.8 g/dL — ABNORMAL LOW (ref 6.5–8.1)

## 2021-11-06 MED ORDER — SODIUM BICARBONATE 650 MG PO TABS
650.0000 mg | ORAL_TABLET | Freq: Two times a day (BID) | ORAL | Status: DC
  Administered 2021-11-06 – 2021-11-14 (×15): 650 mg via ORAL
  Filled 2021-11-06 (×15): qty 1

## 2021-11-06 NOTE — Progress Notes (Signed)
*  PRELIMINARY RESULTS* ?Echocardiogram ?2D Echocardiogram has been performed. ? ?Marilyn Sharp ?11/06/2021, 9:46 AM ?

## 2021-11-06 NOTE — Progress Notes (Signed)
? ?[name] , MD ?[address] [place], [place], [place], [address] ?[address] [place], [place], [place], [address] ?Phone: [phone]  ?Fax: [phone] ? ? ?Marilyn Sharp is being followed for colitis  Day 3 of follow up  ? ?Subjective: ?No abdominal pain, no nausea no vomiting the amount of blood in the stool has been decreasing. ? ? ?Objective: ?Vital signs in last 24 hours: ?Vitals:  ? 11/05/21 1704 11/05/21 1933 11/06/21 0524 11/06/21 0843  ?BP: (!) 161/87 (!) 169/85 (!) 154/82 140/80  ?Pulse: 79 79 77 81  ?Resp: '16 18 17 16  '$ ?Temp: 98 ?F (36.7 ?C) 98.2 ?F (36.8 ?C) 98.3 ?F (36.8 ?C) (!) 97.3 ?F (36.3 ?C)  ?TempSrc: Oral Oral    ?SpO2: 94% 94% 92% 95%  ?Weight:      ?Height:      ? ?Weight change:  ? ?Intake/Output Summary (Last 24 hours) at 11/06/2021 0901 ?Last data filed at 11/06/2021 0600 ?Gross per 24 hour  ?Intake 460 ml  ?Output 300 ml  ?Net 160 ml  ? ? ? ?Exam: ?Neuro moving all 4 limbs no gross neurological deficit but feels weak to get out of bed ? ? ?Lab Results: ?'@LABTEST2'$ @ ?Micro Results: ?Recent Results (from the past 240 hour(s))  ?Gastrointestinal Panel by PCR , Stool     Status: Abnormal  ? Collection Time: 10/31/21  8:20 AM  ? Specimen: Stool  ?Result Value Ref Range Status  ? Campylobacter species NOT DETECTED NOT DETECTED Final  ? Plesimonas shigelloides NOT DETECTED NOT DETECTED Final  ? Salmonella species NOT DETECTED NOT DETECTED Final  ? Yersinia enterocolitica NOT DETECTED NOT DETECTED Final  ? Vibrio species NOT DETECTED NOT DETECTED Final  ? Vibrio cholerae NOT DETECTED NOT DETECTED Final  ? Enteroaggregative E coli (EAEC) NOT DETECTED NOT DETECTED Final  ? Enteropathogenic E coli (EPEC) NOT DETECTED NOT DETECTED Final  ? Enterotoxigenic E coli (ETEC) DETECTED (A) NOT DETECTED Final  ?  Comment: RESULT CALLED TO, READ BACK BY AND VERIFIED WITH: ?JASMYNE DAVIS 10/31/21 1012 MW ?  ? Shiga like toxin producing E coli (STEC) NOT DETECTED NOT DETECTED Final  ?  Shigella/Enteroinvasive E coli (EIEC) NOT DETECTED NOT DETECTED Final  ? Cryptosporidium NOT DETECTED NOT DETECTED Final  ? Cyclospora cayetanensis NOT DETECTED NOT DETECTED Final  ? Entamoeba histolytica NOT DETECTED NOT DETECTED Final  ? Giardia lamblia NOT DETECTED NOT DETECTED Final  ? Adenovirus F40/41 NOT DETECTED NOT DETECTED Final  ? Astrovirus NOT DETECTED NOT DETECTED Final  ? Norovirus GI/GII NOT DETECTED NOT DETECTED Final  ? Rotavirus A NOT DETECTED NOT DETECTED Final  ? Sapovirus (I, II, IV, and V) NOT DETECTED NOT DETECTED Final  ?  Comment: Performed at [facility], [address] [place], [place] [address]  ?C Difficile Quick Screen w PCR reflex     Status: None  ? Collection Time: 10/31/21  8:25 AM  ? Specimen: Stool  ?Result Value Ref Range Status  ? C Diff antigen NEGATIVE NEGATIVE Final  ? C Diff toxin NEGATIVE NEGATIVE Final  ? C Diff interpretation No C. difficile detected.  Final  ?  Comment: Performed at [facility], [address] [place], [place] [address]  ? ?Studies/Results: ?No results found. ?Medications: I have reviewed the patient's current medications. ?Scheduled Meds: ? feeding supplement  237 mL Oral TID BM  ? heparin injection (subcutaneous)  5,000 Units Subcutaneous Q8H  ? hydrALAZINE  50 mg Oral Q8H  ?  multivitamin with minerals  1 tablet Oral Daily  ? vitamin B-12  500 mcg Oral Daily  ? ?Continuous Infusions: ? sodium chloride 10 mL/hr at 11/05/21 0233  ? sodium chloride Stopped (11/03/21 0438)  ? piperacillin-tazobactam (ZOSYN)  IV 3.375 g (11/06/21 0549)  ? ?PRN Meds:.sodium chloride, acetaminophen, hydrALAZINE, ondansetron **OR** ondansetron (ZOFRAN) IV ? ? ?Assessment: ?Principal Problem: ?  AKI (acute kidney injury) ([place]) ?Active Problems: ?  Major depression in remission [facility]) ?  GERD (gastroesophageal reflux disease) ?  HTN (hypertension) ?  Carotid stenosis, asymptomatic, minimal ?  Benign essential HTN ?  Acute kidney failure, unspecified  ([place]) ?  Chronic kidney disease, stage 3 unspecified ([place]) ?  Monoclonal gammopathy ?  Enterotoxigenic Escherichia coli infection ?  Leukocytosis ?  Malnutrition of moderate degree ?  Colitis ? ?Marilyn Sharp 86 y.o. 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  She has recurrence of left lower quadrant pain associated with bloody diarrhea.  Stool studies on admission revealed enterotoxigenic E. coli infection.  Currently on Zosyn.  Stool for C. difficile negative.Due to recurrence of symptoms and left-sided colitis a colonoscopy was performed and showed features of inflammation that were patchy but were seen from the transverse colon all the way up to the sigmoid colon sparing the rectum.  These were not typical of inflammatory bowel disease. Biopsies suggest features of infectious colitis as well as ischemic colitis. ?  ?  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 ? Significance.  Features of ischemic colitis seen on biopsies.  I believe the plan is to obtain a CT angiogram after her renal function improves.  I explained to the patient that we will continue to follow her up as an outpatient. ? ? ?I will sign off.  Please call me if any further GI concerns or questions.  We would like to thank you for the opportunity to participate in the care of Marilyn Sharp.  ? ? ? ? LOS: 6 days  ? ?[name], MD ?11/06/2021, 9:01 AM  ?

## 2021-11-06 NOTE — Progress Notes (Signed)
Physical Therapy Treatment ?Patient Details ?Name: Marilyn Sharp ?MRN: [id number] ?DOB: June 29, 1936 ?Today's Date: 11/06/2021 ? ? ?History of Present Illness [name] is an 86 year old female with with history of depression, anxiety, hyperlipidemia, hypertension, CKD 3B, GERD, MGUS, who presents emergency department for chief concerns of persistent colitis. ? ?  ?PT Comments  ? ? Pt is making good progress towards goals with ability to ambulate in hallway this date. RW used with slight progression of gait speed. Still needs cues for L foot drag. Will continue to progress as able.   ?Recommendations for follow up therapy are one component of a multi-disciplinary discharge planning process, led by the attending physician.  Recommendations may be updated based on patient status, additional functional criteria and insurance authorization. ? ?Follow Up Recommendations ? Skilled nursing-short term rehab (<3 hours/day) ?  ?  ?Assistance Recommended at Discharge Intermittent Supervision/Assistance  ?Patient can return home with the following A little help with walking and/or transfers;A little help with bathing/dressing/bathroom;Help with stairs or ramp for entrance ?  ?Equipment Recommendations ? Rolling walker (2 wheels)  ?  ?Recommendations for Other Services   ? ? ?  ?Precautions / Restrictions Precautions ?Precautions: Fall ?Restrictions ?Weight Bearing Restrictions: No  ?  ? ?Mobility ? Bed Mobility ?Overal bed mobility: Needs Assistance ?Bed Mobility: Supine to Sit ?  ?  ?Supine to sit: Min guard ?  ?  ?General bed mobility comments: improved technique with ability to self initiate mobility and safe technique ?  ? ?Transfers ?Overall transfer level: Needs assistance ?Equipment used: Rolling walker (2 wheels) ?Transfers: Sit to/from Stand ?Sit to Stand: Min guard ?  ?  ?  ?  ?  ?General transfer comment: improved technique with upright posture ?  ? ?Ambulation/Gait ?Ambulation/Gait assistance: Min guard ?Gait  Distance (Feet): 80 Feet ?Assistive device: Rolling walker (2 wheels) ?Gait Pattern/deviations: Step-through pattern ?  ?  ?  ?General Gait Details: slight improvement in gait speed. Still presents with step to gait pattern with L foot drag, needs cues for foot clearance. Fatigues quickly with 1 standing rest break required ? ? ?Stairs ?  ?  ?  ?  ?  ? ? ?Wheelchair Mobility ?  ? ?Modified Rankin (Stroke Patients Only) ?  ? ? ?  ?Balance Overall balance assessment: Needs assistance ?Sitting-balance support: No upper extremity supported, Feet supported ?Sitting balance-Leahy Scale: Good ?  ?  ?Standing balance support: Bilateral upper extremity supported, During functional activity ?Standing balance-Leahy Scale: Fair ?  ?  ?  ?  ?  ?  ?  ?  ?  ?  ?  ?  ?  ? ?  ?Cognition Arousal/Alertness: Awake/alert ?Behavior During Therapy: [facility] for tasks assessed/performed ?Overall Cognitive Status: Within Functional Limits for tasks assessed ?  ?  ?  ?  ?  ?  ?  ?  ?  ?  ?  ?  ?  ?  ?  ?  ?General Comments: presents with bright affect and agreeable to therapy session ?  ?  ? ?  ?Exercises   ? ?  ?General Comments   ?  ?  ? ?Pertinent Vitals/Pain Pain Assessment ?Pain Assessment: Faces ?Faces Pain Scale: Hurts a little bit ?Pain Location: L LE ?Pain Descriptors / Indicators: Heaviness ?Pain Intervention(s): Limited activity within patient's tolerance, Repositioned  ? ? ?Home Living   ?  ?  ?  ?  ?  ?  ?  ?  ?  ?   ?  ?  Prior Function    ?  ?  ?   ? ?PT Goals (current goals can now be found in the care plan section) Acute Rehab PT Goals ?Patient Stated Goal: to go home. ?PT Goal Formulation: With patient ?Time For Goal Achievement: 11/15/21 ?Potential to Achieve Goals: Good ?Progress towards PT goals: Progressing toward goals ? ?  ?Frequency ? ? ? Min 2X/week ? ? ? ?  ?PT Plan Current plan remains appropriate  ? ? ?Co-evaluation   ?  ?  ?  ?  ? ?  ?AM-PAC PT "6 Clicks" Mobility   ?Outcome Measure ? Help needed turning from your back  to your side while in a flat bed without using bedrails?: A Little ?Help needed moving from lying on your back to sitting on the side of a flat bed without using bedrails?: A Little ?Help needed moving to and from a bed to a chair (including a wheelchair)?: A Little ?Help needed standing up from a chair using your arms (e.g., wheelchair or bedside chair)?: A Little ?Help needed to walk in hospital room?: A Little ?Help needed climbing 3-5 steps with a railing? : A Lot ?6 Click Score: 17 ? ?  ?End of Session Equipment Utilized During Treatment: Gait belt ?Activity Tolerance: Patient limited by fatigue ?Patient left: in chair;with chair alarm set;with family/visitor present ?Nurse Communication: Mobility status ?PT Visit Diagnosis: Unsteadiness on feet (R26.81);Other abnormalities of gait and mobility (R26.89);Repeated falls (R29.6);Muscle weakness (generalized) (M62.81);History of falling (Z91.81);Difficulty in walking, not elsewhere classified (R26.2) ?  ? ? ?Time: 2952-8413 ?PT Time Calculation (min) (ACUTE ONLY): 24 min ? ?Charges:  $Gait Training: 23-37 mins          ?          ? ?[name], PT, DPT, GCS ?[phone] ? ? ? ?Marilyn Sharp ?11/06/2021, 1:28 PM ? ?

## 2021-11-06 NOTE — Progress Notes (Addendum)
?PROGRESS NOTE ? ? ? ?Sabriel Borromeo Yandell  WSF:[id number] DOB: [birth year] DOA: 10/30/2021 ?PCP: Idelle Crouch, MD  ? ? ?Brief Narrative:  ?Ms. Ruben Lieber is an 86 year old female with with history of depression, anxiety, hyperlipidemia, hypertension, CKD 3B, GERD, MGUS, who presents emergency department for chief concerns of persistent colitis. ? ?Initial vitals in the emergency department show temperature of 98.8, respiration rate of 16, heart rate 81, blood pressure 149/87, SPO2 of 96% on room air. ? ?Serum sodium 136, potassium 3.6, chloride 99, bicarb 26, BUN 59, serum creatinine of 2.60, nonfasting blood glucose 118, GFR 18, WBC 22.6, hemoglobin 13.2, platelets of 512. ?  ?UA was negative for leukocytes and nitrates. ?  ?ED treatment: IV Zosyn 3.375 mg one-time dose, sodium chloride 500 mL bolus ? ? ? ?Consultants:  ?GI, vascular ? ?Procedures:  ? ?Antimicrobials:  ?Zosyn ? ? ?Subjective: ?Mild nausea. No abd pain this am. No vomiting . No sob.no cp ? ?Objective: ?Vitals:  ? 11/05/21 1933 11/06/21 0524 11/06/21 0843 11/06/21 1542  ?BP: (!) 169/85 (!) 154/82 140/80 (!) 151/83  ?Pulse: 79 77 81 77  ?Resp: '18 17 16   '$ ?Temp: 98.2 ?F (36.8 ?C) 98.3 ?F (36.8 ?C) (!) 97.3 ?F (36.3 ?C) 97.8 ?F (36.6 ?C)  ?TempSrc: Oral   Oral  ?SpO2: 94% 92% 95% 94%  ?Weight:      ?Height:      ? ? ?Intake/Output Summary (Last 24 hours) at 11/06/2021 1557 ?Last data filed at 11/06/2021 1431 ?Gross per 24 hour  ?Intake 720 ml  ?Output 300 ml  ?Net 420 ml  ? ?Filed Weights  ? 11/01/21 2200  ?Weight: 68.3 kg  ? ? ?Examination: ?Calm, NAD ?Decrease bs, no wheezing ?Reg s1/s2 no gallop ?Soft benign +bs ?Mild LE edema  ?Aaoxox3  ?Mood and affect appropriate in current setting  ? ? ? ?Data Reviewed: I have personally reviewed following labs and imaging studies ? ?CBC: ?Recent Labs  ?Lab 11/02/21 ?0174 11/03/21 ?0504 11/04/21 ?9449 11/05/21 ?0510 11/06/21 ?0407  ?WBC 15.9* 15.0* 16.4* 17.1* 18.4*  ?HGB 12.7 12.7 12.0 11.7* 11.8*  ?HCT 39.2 39.7  37.8 37.8 37.4  ?MCV 88.7 89.2 90.2 90.4 90.8  ?PLT 484* 496* 506* 451* 471*  ? ?Basic Metabolic Panel: ?Recent Labs  ?Lab 11/02/21 ?6759 11/03/21 ?0504 11/04/21 ?1638 11/05/21 ?0510 11/06/21 ?0407  ?NA 143 142 143 144 141  ?K 3.6 3.2* 3.7 3.6 3.8  ?CL 112* 112* 116* 119* 115*  ?CO2 22 21* 20* 19* 20*  ?GLUCOSE 98 90 85 94 94  ?BUN 38* 29* '20 20 21  '$ ?CREATININE 1.93* 1.58* 1.45* 1.33* 1.24*  ?CALCIUM 7.9* 7.8* 7.7* 7.6* 7.9*  ? ?GFR: ?Estimated Creatinine Clearance: 30.9 mL/min (A) (by C-G formula based on SCr of 1.24 mg/dL (H)). ?Liver Function Tests: ?Recent Labs  ?Lab 11/02/21 ?4665 11/03/21 ?0504 11/04/21 ?9935 11/05/21 ?0510 11/06/21 ?0407  ?AST 11* 12* 12* 10* 11*  ?ALT '8 9 8 8 9  '$ ?ALKPHOS 44 46 42 39 40  ?BILITOT 1.1 0.8 0.8 0.6 0.6  ?PROT 5.0* 5.2* 4.7* 4.8* 4.8*  ?ALBUMIN 2.7* 2.8* 2.5* 2.5* 2.4*  ? ?No results for input(s): LIPASE, AMYLASE in the last 168 hours. ?No results for input(s): AMMONIA in the last 168 hours. ?Coagulation Profile: ?No results for input(s): INR, PROTIME in the last 168 hours. ?Cardiac Enzymes: ?No results for input(s): CKTOTAL, CKMB, CKMBINDEX, TROPONINI in the last 168 hours. ?BNP (last 3 results) ?No results for input(s): PROBNP in the  last 8760 hours. ?HbA1C: ?No results for input(s): HGBA1C in the last 72 hours. ?CBG: ?No results for input(s): GLUCAP in the last 168 hours. ?Lipid Profile: ?No results for input(s): CHOL, HDL, LDLCALC, TRIG, CHOLHDL, LDLDIRECT in the last 72 hours. ?Thyroid Function Tests: ?No results for input(s): TSH, T4TOTAL, FREET4, T3FREE, THYROIDAB in the last 72 hours. ?Anemia Panel: ?No results for input(s): VITAMINB12, FOLATE, FERRITIN, TIBC, IRON, RETICCTPCT in the last 72 hours. ?Sepsis Labs: ?Recent Labs  ?Lab 10/30/21 ?1905 10/30/21 ?2142  ?LATICACIDVEN 0.8 0.8  ? ? ?Recent Results (from the past 240 hour(s))  ?Gastrointestinal Panel by PCR , Stool     Status: Abnormal  ? Collection Time: 10/31/21  8:20 AM  ? Specimen: Stool  ?Result Value Ref Range  Status  ? Campylobacter species NOT DETECTED NOT DETECTED Final  ? Plesimonas shigelloides NOT DETECTED NOT DETECTED Final  ? Salmonella species NOT DETECTED NOT DETECTED Final  ? Yersinia enterocolitica NOT DETECTED NOT DETECTED Final  ? Vibrio species NOT DETECTED NOT DETECTED Final  ? Vibrio cholerae NOT DETECTED NOT DETECTED Final  ? Enteroaggregative E coli (EAEC) NOT DETECTED NOT DETECTED Final  ? Enteropathogenic E coli (EPEC) NOT DETECTED NOT DETECTED Final  ? Enterotoxigenic E coli (ETEC) DETECTED (A) NOT DETECTED Final  ?  Comment: RESULT CALLED TO, READ BACK BY AND VERIFIED WITH: ?JASMYNE DAVIS 10/31/21 1012 MW ?  ? Shiga like toxin producing E coli (STEC) NOT DETECTED NOT DETECTED Final  ? Shigella/Enteroinvasive E coli (EIEC) NOT DETECTED NOT DETECTED Final  ? Cryptosporidium NOT DETECTED NOT DETECTED Final  ? Cyclospora cayetanensis NOT DETECTED NOT DETECTED Final  ? Entamoeba histolytica NOT DETECTED NOT DETECTED Final  ? Giardia lamblia NOT DETECTED NOT DETECTED Final  ? Adenovirus F40/41 NOT DETECTED NOT DETECTED Final  ? Astrovirus NOT DETECTED NOT DETECTED Final  ? Norovirus GI/GII NOT DETECTED NOT DETECTED Final  ? Rotavirus A NOT DETECTED NOT DETECTED Final  ? Sapovirus (I, II, IV, and V) NOT DETECTED NOT DETECTED Final  ?  Comment: Performed at [facility], [address], [place], [place] [address]  ?C Difficile Quick Screen w PCR reflex     Status: None  ? Collection Time: 10/31/21  8:25 AM  ? Specimen: Stool  ?Result Value Ref Range Status  ? C Diff antigen NEGATIVE NEGATIVE Final  ? C Diff toxin NEGATIVE NEGATIVE Final  ? C Diff interpretation No C. difficile detected.  Final  ?  Comment: Performed at [facility], [address], [place], [place] [address]  ?  ? ? ? ? ? ?Radiology Studies: ?No results found. ? ? ? ? ? ?Scheduled Meds: ? feeding supplement  237 mL Oral TID BM  ? heparin injection (subcutaneous)  5,000 Units Subcutaneous Q8H  ? hydrALAZINE  50 mg Oral Q8H   ? multivitamin with minerals  1 tablet Oral Daily  ? vitamin B-12  500 mcg Oral Daily  ? ?Continuous Infusions: ? sodium chloride 10 mL/hr at 11/05/21 0233  ? sodium chloride Stopped (11/03/21 0438)  ? piperacillin-tazobactam (ZOSYN)  IV 3.375 g (11/06/21 1347)  ? ? ?Assessment & Plan: ?  ?Principal Problem: ?  AKI (acute kidney injury) ([place]) ?Active Problems: ?  Major depression in remission [facility]) ?  GERD (gastroesophageal reflux disease) ?  HTN (hypertension) ?  Carotid stenosis, asymptomatic, minimal ?  Benign essential HTN ?  Acute kidney failure, unspecified ([place]) ?  Chronic kidney disease, stage 3 unspecified ([place]) ?  Monoclonal gammopathy ?  Enterotoxigenic Escherichia coli infection ?  Leukocytosis ?  Malnutrition of moderate degree ?  Colitis ? ? ?AKI (acute kidney injury) ([place]) ?- Presumed secondary to contrast nephropathy in setting of recent CT studies involving contrast ?3/29 improved with IV fluids.  Encourage p.o. intake i.e. Ensure ?Creatinine 1.24 ?Avoid nephrotoxic meds ?  ?Leukocytosis ?-Likely secondary to above colitis ?3/29 wbc trending up ? ?  ?Enterotoxigenic Escherichia coli infection ?- GI and C. difficile PCR panel reviewed. CDiff is neg. Stool study is pos for ETEC ?-Given hx of recurrent colitis, GI was consulted at time of presentation ?-Continued on empiric zosyn ?-GI following. Pt underwent colonoscopy with findings of patchy bullous blebs and colitis, appearing ischemic. 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 Vascular surgery had recommended CT angio once renal function normalizes ?3/29 GI will f/u as outpatient.  I will sign off ?Will reach out to vascular about plans /cta .  ? ? ?Moderate Malnutrition  ?Due to social/environmental circumstances as evidence by moderate fat depletion, mild muscle depletion ?RD following-please see note ?Continue Ensure 3 times daily ?Encourage p.o. intake ?  ?Acute  kidney failure, unspecified ([place]) ?-Renal US performed, reviewed. No hydronephrosis. -Possible non-obstructive calculus in mid-pole ?-Pt is s/p recent CT studies involving contrast  ?-CT abd/pelvis 3/22 r

## 2021-11-06 NOTE — Progress Notes (Signed)
Nutrition Follow-up ? ?DOCUMENTATION CODES:  ? ?Non-severe (moderate) malnutrition in context of social or environmental circumstances ? ?INTERVENTION:  ? ?-Continue Ensure Enlive po TID, each supplement provides 350 kcal and 20 grams of protein ?-Continue MVI with minerals daily ?  ?NUTRITION DIAGNOSIS:  ? ?Moderate Malnutrition related to social / environmental circumstances (advanced age, poor oral intake) as evidenced by moderate fat depletion, moderate muscle depletion. ? ?Ongoing ? ?GOAL:  ? ?Patient will meet greater than or equal to 90% of their needs ? ?Progressing  ? ?MONITOR:  ? ?Labs, Weight trends, Skin, I & O's, Diet advancement ? ?REASON FOR ASSESSMENT:  ? ?Malnutrition Screening Tool ?  ? ?ASSESSMENT:  ? ?86 y/o female with h/o MDD, anxiety, HLD, HTN, CKD III, MGUS and GERD who is admitted with colitis. ? ?Reviewed I/O's: +158 ml x 24 hours and +8.8 L since admission ? ?UOP: 301 ml x 24 hours  ? ?Per GI notes, plan for CT angiogram once renal function improves.  ? ?Spoke with pt over the phone, who reports feeling "no better, but no worse". Pt is tolerating soft diet well, but not eating very much. She is consuming mostly yogurt and scrambled eggs. Noted meal completions 50%. Pt is consuming at least 2 Ensures per day. She also is going to try some ice cream at dinner, as she is tired of the [place] Ice pops.  ? ?Discussed importance of good meal and supplement intake to promote healing. Pt amenable to continue supplements.  ? ?Medications reviewed and include vitamin B-12.  ? ?Labs reviewed.  ? ?Diet Order:   ?Diet Order   ? ?       ?  DIET SOFT Room service appropriate? Yes; Fluid consistency: Thin  Diet effective now       ?  ? ?  ?  ? ?  ? ? ?EDUCATION NEEDS:  ? ?Education needs have been addressed ? ?Skin:  Skin Assessment: Reviewed RN Assessment ? ?Last BM:  11/05/21 ? ?Height:  ? ?Ht Readings from Last 1 Encounters:  ?11/01/21 '5\' 5"'$  (1.651 m)  ? ? ?Weight:  ? ?Wt Readings from Last 1  Encounters:  ?11/01/21 68.3 kg  ? ? ?Ideal Body Weight:  56.8 kg ? ?BMI:  Body mass index is 25.06 kg/m?. ? ?Estimated Nutritional Needs:  ? ?Kcal:  1400-1600kcal/day ? ?Protein:  70-80g/day ? ?Fluid:  1.4-1.6L/day ? ? ? ?Loistine Chance, RD, LDN, CDCES ?Registered Dietitian II ?Certified Diabetes Care and Education Specialist ?Please refer to [facility] for RD and/or RD on-call/weekend/after hours pager  ?

## 2021-11-06 NOTE — Plan of Care (Signed)
?  Problem: Clinical Measurements: ?Goal: Will remain free from infection ?Outcome: Not Progressing ?  ?Problem: Activity: ?Goal: Risk for activity intolerance will decrease ?Outcome: Not Progressing ?  ?Problem: Elimination: ?Goal: Will not experience complications related to bowel motility ?Outcome: Not Progressing ?  ?

## 2021-11-07 DIAGNOSIS — N179 Acute kidney failure, unspecified: Secondary | ICD-10-CM | POA: Diagnosis not present

## 2021-11-07 LAB — CREATININE, SERUM
Creatinine, Ser: 1.18 mg/dL — ABNORMAL HIGH (ref 0.44–1.00)
GFR, Estimated: 46 mL/min — ABNORMAL LOW (ref >60)

## 2021-11-07 NOTE — Care Management Important Message (Signed)
Important Message ? ?Patient Details  ?Name: Marilyn Sharp ?MRN: [id number] ?Date of Birth: March 21, 1936 ? ? ?Medicare Important Message Given:  Yes ? ? ? ? ?[name] ?11/07/2021, 11:13 AM ?

## 2021-11-07 NOTE — TOC Progression Note (Signed)
Transition of Care (TOC) - Progression Note  ? ? ?Patient Details  ?Name: Marilyn Sharp ?MRN: [id number] ?Date of Birth: 1936-03-03 ? ?Transition of Care (TOC) CM/SW Contact  ?Beverly Sessions, RN ?Phone Number: ?11/07/2021, 2:17 PM ? ?Clinical Narrative:    ? ? ?Per MD earliest patient will be ready for discharge will be over the weekend.  Seth Bake at [facility] updated.  I have inquired if they take admission on the weekend  ?Expected Discharge Plan: Home/Self Care ?Barriers to Discharge: Continued Medical Work up ? ?Expected Discharge Plan and Services ?Expected Discharge Plan: Home/Self Care ?  ?  ?  ?Living arrangements for the past 2 months: [place] ?                ?  ?  ?  ?  ?  ?  ?  ?  ?  ?  ? ? ?Social Determinants of Health (SDOH) Interventions ?  ? ?Readmission Risk Interventions ?   ? View : No data to display.  ?  ?  ?  ? ? ?

## 2021-11-07 NOTE — Progress Notes (Addendum)
?PROGRESS NOTE ? ? ? ?Marilyn Sharp  XFG:[id number] DOB: [birth year] DOA: 10/30/2021 ?PCP: Marilyn Crouch, MD  ? ? ?Brief Narrative:  ?[name] is an 86 year old female with with history of depression, anxiety, hyperlipidemia, hypertension, CKD 3B, GERD, MGUS, who presents emergency department for chief concerns of persistent colitis. ? ?Initial vitals in the emergency department show temperature of 98.8, respiration rate of 16, heart rate 81, blood pressure 149/87, SPO2 of 96% on room air. ? ?Serum sodium 136, potassium 3.6, chloride 99, bicarb 26, BUN 59, serum creatinine of 2.60, nonfasting blood glucose 118, GFR 18, WBC 22.6, hemoglobin 13.2, platelets of 512. ?  ?UA was negative for leukocytes and nitrates. ?  ?ED treatment: IV Zosyn 3.375 mg one-time dose, sodium chloride 500 mL bolus ? ?3/30-trying to increase po intake.  ? ?Consultants:  ?GI, vascular ? ?Procedures:  ? ?Antimicrobials:  ?Zosyn ? ? ?Subjective: ?No n/v. No abd pain this am.  ? ?Objective: ?Vitals:  ? 11/06/21 1957 11/07/21 9678 11/07/21 0750 11/07/21 9381  ?BP: (!) 154/81 (!) 169/83 (!) 143/83 138/76  ?Pulse: 80 77 75 76  ?Resp: '18 18 20 20  '$ ?Temp: 98.6 ?F (37 ?C) 98.5 ?F (36.9 ?C) 97.9 ?F (36.6 ?C) 98.2 ?F (36.8 ?C)  ?TempSrc: Oral Oral    ?SpO2: 93% 93% 96% 95%  ?Weight:      ?Height:      ? ? ?Intake/Output Summary (Last 24 hours) at 11/07/2021 1332 ?Last data filed at 11/07/2021 0500 ?Gross per 24 hour  ?Intake 600 ml  ?Output 500 ml  ?Net 100 ml  ? ?Filed Weights  ? 11/01/21 2200  ?Weight: 68.3 kg  ? ? ?Examination: ?Calm, NAD ?Cta no w/r ?Reg s1/s2 no gallop ?Soft benign +bs ?Mild LE edema ?Aaoxox3  ?Mood and affect appropriate in current setting  ? ? ? ?Data Reviewed: I have personally reviewed following labs and imaging studies ? ?CBC: ?Recent Labs  ?Lab 11/02/21 ?0175 11/03/21 ?0504 11/04/21 ?1025 11/05/21 ?0510 11/06/21 ?0407  ?WBC 15.9* 15.0* 16.4* 17.1* 18.4*  ?HGB 12.7 12.7 12.0 11.7* 11.8*  ?HCT 39.2 39.7 37.8 37.8 37.4   ?MCV 88.7 89.2 90.2 90.4 90.8  ?PLT 484* 496* 506* 451* 471*  ? ?Basic Metabolic Panel: ?Recent Labs  ?Lab 11/02/21 ?8527 11/03/21 ?0504 11/04/21 ?7824 11/05/21 ?0510 11/06/21 ?0407 11/07/21 ?0339  ?NA 143 142 143 144 141  --   ?K 3.6 3.2* 3.7 3.6 3.8  --   ?CL 112* 112* 116* 119* 115*  --   ?CO2 22 21* 20* 19* 20*  --   ?GLUCOSE 98 90 85 94 94  --   ?BUN 38* 29* '20 20 21  '$ --   ?CREATININE 1.93* 1.58* 1.45* 1.33* 1.24* 1.18*  ?CALCIUM 7.9* 7.8* 7.7* 7.6* 7.9*  --   ? ?GFR: ?Estimated Creatinine Clearance: 32.5 mL/min (A) (by C-G formula based on SCr of 1.18 mg/dL (H)). ?Liver Function Tests: ?Recent Labs  ?Lab 11/02/21 ?2353 11/03/21 ?0504 11/04/21 ?6144 11/05/21 ?0510 11/06/21 ?0407  ?AST 11* 12* 12* 10* 11*  ?ALT '8 9 8 8 9  '$ ?ALKPHOS 44 46 42 39 40  ?BILITOT 1.1 0.8 0.8 0.6 0.6  ?PROT 5.0* 5.2* 4.7* 4.8* 4.8*  ?ALBUMIN 2.7* 2.8* 2.5* 2.5* 2.4*  ? ?No results for input(s): LIPASE, AMYLASE in the last 168 hours. ?No results for input(s): AMMONIA in the last 168 hours. ?Coagulation Profile: ?No results for input(s): INR, PROTIME in the last 168 hours. ?Cardiac Enzymes: ?No results for  input(s): CKTOTAL, CKMB, CKMBINDEX, TROPONINI in the last 168 hours. ?BNP (last 3 results) ?No results for input(s): PROBNP in the last 8760 hours. ?HbA1C: ?No results for input(s): HGBA1C in the last 72 hours. ?CBG: ?No results for input(s): GLUCAP in the last 168 hours. ?Lipid Profile: ?No results for input(s): CHOL, HDL, LDLCALC, TRIG, CHOLHDL, LDLDIRECT in the last 72 hours. ?Thyroid Function Tests: ?No results for input(s): TSH, T4TOTAL, FREET4, T3FREE, THYROIDAB in the last 72 hours. ?Anemia Panel: ?No results for input(s): VITAMINB12, FOLATE, FERRITIN, TIBC, IRON, RETICCTPCT in the last 72 hours. ?Sepsis Labs: ?No results for input(s): PROCALCITON, LATICACIDVEN in the last 168 hours. ? ? ?Recent Results (from the past 240 hour(s))  ?Gastrointestinal Panel by PCR , Stool     Status: Abnormal  ? Collection Time: 10/31/21  8:20 AM   ? Specimen: Stool  ?Result Value Ref Range Status  ? Campylobacter species NOT DETECTED NOT DETECTED Final  ? Plesimonas shigelloides NOT DETECTED NOT DETECTED Final  ? Salmonella species NOT DETECTED NOT DETECTED Final  ? Yersinia enterocolitica NOT DETECTED NOT DETECTED Final  ? Vibrio species NOT DETECTED NOT DETECTED Final  ? Vibrio cholerae NOT DETECTED NOT DETECTED Final  ? Enteroaggregative E coli (EAEC) NOT DETECTED NOT DETECTED Final  ? Enteropathogenic E coli (EPEC) NOT DETECTED NOT DETECTED Final  ? Enterotoxigenic E coli (ETEC) DETECTED (A) NOT DETECTED Final  ?  Comment: RESULT CALLED TO, READ BACK BY AND VERIFIED WITH: ?Marilyn Sharp 10/31/21 1012 MW ?  ? Shiga like toxin producing E coli (STEC) NOT DETECTED NOT DETECTED Final  ? Shigella/Enteroinvasive E coli (EIEC) NOT DETECTED NOT DETECTED Final  ? Cryptosporidium NOT DETECTED NOT DETECTED Final  ? Cyclospora cayetanensis NOT DETECTED NOT DETECTED Final  ? Entamoeba histolytica NOT DETECTED NOT DETECTED Final  ? Giardia lamblia NOT DETECTED NOT DETECTED Final  ? Adenovirus F40/41 NOT DETECTED NOT DETECTED Final  ? Astrovirus NOT DETECTED NOT DETECTED Final  ? Norovirus GI/GII NOT DETECTED NOT DETECTED Final  ? Rotavirus A NOT DETECTED NOT DETECTED Final  ? Sapovirus (I, II, IV, and V) NOT DETECTED NOT DETECTED Final  ?  Comment: Performed at [facility], [address], [place] [address]  ?C Difficile Quick Screen w PCR reflex     Status: None  ? Collection Time: 10/31/21  8:25 AM  ? Specimen: Stool  ?Result Value Ref Range Status  ? C Diff antigen NEGATIVE NEGATIVE Final  ? C Diff toxin NEGATIVE NEGATIVE Final  ? C Diff interpretation No C. difficile detected.  Final  ?  Comment: Performed at [facility], [address], [place], [place] [address]  ?  ? ? ? ? ? ?Radiology Studies: ?ECHOCARDIOGRAM COMPLETE ? ?Result Date: 11/06/2021 ?   ECHOCARDIOGRAM REPORT   Patient Name:   Marilyn Sharp Date of Exam: 11/06/2021  Medical Rec #:  [id number]     Height:       65.0 in Accession #:    [id number]    Weight:       150.6 lb Date of Birth:  1935/09/09     BSA:          1.753 m? Patient Age:    4 years      BP:           154/82 mmHg Patient Gender: F             HR:           82  bpm. Exam Location:  [facility] Procedure: 2D Echo, Color Doppler and Cardiac Doppler Indications:     I50.31 congestive heart failure-Acute Diastolic  History:         Patient has no prior history of Echocardiogram examinations.                  Risk Factors:Hypertension and Dyslipidemia.  Sonographer:     Charmayne Sheer Referring Phys:  [place] Diagnosing Phys: Kathlyn Sacramento MD IMPRESSIONS  1. Left ventricular ejection fraction, by estimation, is 65 to 70%. The left ventricle has normal function. The left ventricle has no regional wall motion abnormalities. There is mild left ventricular hypertrophy. Left ventricular diastolic parameters are consistent with Grade II diastolic dysfunction (pseudonormalization).  2. Right ventricular systolic function is normal. The right ventricular size is normal. There is severely elevated pulmonary artery systolic pressure. The estimated right ventricular systolic pressure is 62.6 mmHg.  3. Left atrial size was mildly dilated.  4. A small pericardial effusion is present. The pericardial effusion is circumferential.  5. The mitral valve is normal in structure. No evidence of mitral valve regurgitation. No evidence of mitral stenosis.  6. The aortic valve is calcified. Aortic valve regurgitation is not visualized. Mild aortic valve stenosis. Aortic valve area, by VTI measures 2.37 cm?Marland Kitchen Aortic valve mean gradient measures 10.0 mmHg.  7. The inferior vena cava is dilated in size with <50% respiratory variability, suggesting right atrial pressure of 15 mmHg. FINDINGS  Left Ventricle: Left ventricular ejection fraction, by estimation, is 65 to 70%. The left ventricle has normal function. The left ventricle has no regional  wall motion abnormalities. The left ventricular internal cavity size was normal in size. There is  mild left ventricular hypertrophy. Left ventricular diastolic parameters are consistent with Grade II diastolic dy

## 2021-11-08 DIAGNOSIS — N179 Acute kidney failure, unspecified: Secondary | ICD-10-CM | POA: Diagnosis not present

## 2021-11-08 DIAGNOSIS — I771 Stricture of artery: Secondary | ICD-10-CM

## 2021-11-08 LAB — CBC
HCT: 40.8 % (ref 36.0–46.0)
Hemoglobin: 12.9 g/dL (ref 12.0–15.0)
MCH: 28.8 pg (ref 26.0–34.0)
MCHC: 31.6 g/dL (ref 30.0–36.0)
MCV: 91.1 fL (ref 80.0–100.0)
Platelets: 600 10*3/uL — ABNORMAL HIGH (ref 150–400)
RBC: 4.48 MIL/uL (ref 3.87–5.11)
RDW: 16.7 % — ABNORMAL HIGH (ref 11.5–15.5)
WBC: 18.5 10*3/uL — ABNORMAL HIGH (ref 4.0–10.5)
nRBC: 0 % (ref 0.0–0.2)

## 2021-11-08 MED ORDER — LACTATED RINGERS IV SOLN
INTRAVENOUS | Status: DC
Start: 2021-11-08 — End: 2021-11-08

## 2021-11-08 NOTE — TOC Progression Note (Addendum)
Transition of Care (TOC) - Progression Note  ? ? ?Patient Details  ?Name: Marilyn Sharp ?MRN: [id number] ?Date of Birth: Jun 09, 1936 ? ?Transition of Care (TOC) CM/SW Contact  ?Beverly Sessions, RN ?Phone Number: ?11/08/2021, 9:34 AM ? ?Clinical Narrative:    ? ?Per MD anticipated DC tomorrow  ? ?Per Seth Bake at [facility] if patient is ready for discharge over the weekend they can accept the patient. She request to be contacted directly at [phone] ? ? ?Expected Discharge Plan: Home/Self Care ?Barriers to Discharge: Continued Medical Work up ? ?Expected Discharge Plan and Services ?Expected Discharge Plan: Home/Self Care ?  ?  ?  ?Living arrangements for the past 2 months: [place] ?                ?  ?  ?  ?  ?  ?  ?  ?  ?  ?  ? ? ?Social Determinants of Health (SDOH) Interventions ?  ? ?Readmission Risk Interventions ?   ? View : No data to display.  ?  ?  ?  ? ? ?

## 2021-11-08 NOTE — Progress Notes (Signed)
Mobility Specialist - Progress Note ? ? ? 11/08/21 1500  ?Mobility  ?Activity Transferred to/from BSC;Stood at bedside;Ambulated with assistance in hallway  ?Level of Assistance Minimal assist, patient does 75% or more  ?Assistive Device Front wheel walker  ?Distance Ambulated (ft) 40 ft  ?Activity Response Tolerated well  ?$Mobility charge 1 Mobility  ? ? ?Post-mobility: 89 HR, 96%SPO2 ? ?Pt in chair upon arrival using RA. Pt completes STS MinA and vc for hand placement, [facility]. Pt completes STS and ambulates 411fto nursing station with [place]--- 5 pt initiated breaks d/t LE weakness but voices no other complaints. Shuffling of left foot noted throughout. Pt returns to chair with needs in reach. ? ?MaMerrily BrittleMobility Specialist ?11/08/21, 3:08 PM ? ? ?

## 2021-11-08 NOTE — Plan of Care (Signed)
  Problem: Clinical Measurements: Goal: Will remain free from infection Outcome: Progressing   Problem: Activity: Goal: Risk for activity intolerance will decrease Outcome: Progressing   Problem: Nutrition: Goal: Adequate nutrition will be maintained Outcome: Progressing   Problem: Pain Managment: Goal: General experience of comfort will improve Outcome: Progressing   

## 2021-11-08 NOTE — Progress Notes (Signed)
?PROGRESS NOTE ? ? ? ?Marilyn Sharp  BHA:[id number] DOB: 07/31/1936 DOA: 10/30/2021 ?PCP: Idelle Crouch, MD  ? ? ?Brief Narrative:  ?[name] is an 86 year old female with with history of depression, anxiety, hyperlipidemia, hypertension, CKD 3B, GERD, MGUS, who presents emergency department for chief concerns of persistent colitis. ? ?Initial vitals in the emergency department show temperature of 98.8, respiration rate of 16, heart rate 81, blood pressure 149/87, SPO2 of 96% on room air. ? ?Serum sodium 136, potassium 3.6, chloride 99, bicarb 26, BUN 59, serum creatinine of 2.60, nonfasting blood glucose 118, GFR 18, WBC 22.6, hemoglobin 13.2, platelets of 512. ?  ?UA was negative for leukocytes and nitrates. ?  ?ED treatment: IV Zosyn 3.375 mg one-time dose, sodium chloride 500 mL bolus ? ?3/30-trying to increase po intake.  ?3/31 no issues. Po intake better. No complaints. Sitting in chair. Asking about if vascular will see her today. Discussed with patient no cta needed per vascular, but she insisting on speaking to them. Vascular team notified. ? ?Consultants:  ?GI, vascular ? ?Procedures:  ? ?Antimicrobials:  ?Zosyn ? ? ?Subjective: ?No nausea, vomiting or any other symptoms ? ? ? ?Objective: ?Vitals:  ? 11/07/21 1540 11/07/21 2016 11/08/21 0500 11/08/21 0900  ?BP: (!) 156/80 (!) 154/78 (!) 142/74 132/84  ?Pulse: 80 79 79 82  ?Resp: '19 17 20 18  '$ ?Temp: 98.4 ?F (36.9 ?C) 98 ?F (36.7 ?C) 98.6 ?F (37 ?C) 97.8 ?F (36.6 ?C)  ?TempSrc:   Oral   ?SpO2: 96% 96% 94% 94%  ?Weight:      ?Height:      ? ? ?Intake/Output Summary (Last 24 hours) at 11/08/2021 1444 ?Last data filed at 11/08/2021 1016 ?Gross per 24 hour  ?Intake 720 ml  ?Output --  ?Net 720 ml  ? ?Filed Weights  ? 11/01/21 2200  ?Weight: 68.3 kg  ? ? ?Examination: ?Calm, NAD ?Cta no w/r ?Reg s1/s2 no gallop ?Soft benign +bs ?No edema ?Aaoxox3  ?Mood and affect appropriate in current setting  ? ? ? ?Data Reviewed: I have personally reviewed following  labs and imaging studies ? ?CBC: ?Recent Labs  ?Lab 11/03/21 ?0504 11/04/21 ?9735 11/05/21 ?0510 11/06/21 ?0407 11/08/21 ?1028  ?WBC 15.0* 16.4* 17.1* 18.4* 18.5*  ?HGB 12.7 12.0 11.7* 11.8* 12.9  ?HCT 39.7 37.8 37.8 37.4 40.8  ?MCV 89.2 90.2 90.4 90.8 91.1  ?PLT 496* 506* 451* 471* 600*  ? ?Basic Metabolic Panel: ?Recent Labs  ?Lab 11/02/21 ?3299 11/03/21 ?0504 11/04/21 ?2426 11/05/21 ?0510 11/06/21 ?0407 11/07/21 ?0339  ?NA 143 142 143 144 141  --   ?K 3.6 3.2* 3.7 3.6 3.8  --   ?CL 112* 112* 116* 119* 115*  --   ?CO2 22 21* 20* 19* 20*  --   ?GLUCOSE 98 90 85 94 94  --   ?BUN 38* 29* '20 20 21  '$ --   ?CREATININE 1.93* 1.58* 1.45* 1.33* 1.24* 1.18*  ?CALCIUM 7.9* 7.8* 7.7* 7.6* 7.9*  --   ? ?GFR: ?Estimated Creatinine Clearance: 32.5 mL/min (A) (by C-G formula based on SCr of 1.18 mg/dL (H)). ?Liver Function Tests: ?Recent Labs  ?Lab 11/02/21 ?8341 11/03/21 ?0504 11/04/21 ?9622 11/05/21 ?0510 11/06/21 ?0407  ?AST 11* 12* 12* 10* 11*  ?ALT '8 9 8 8 9  '$ ?ALKPHOS 44 46 42 39 40  ?BILITOT 1.1 0.8 0.8 0.6 0.6  ?PROT 5.0* 5.2* 4.7* 4.8* 4.8*  ?ALBUMIN 2.7* 2.8* 2.5* 2.5* 2.4*  ? ?No results for  input(s): LIPASE, AMYLASE in the last 168 hours. ?No results for input(s): AMMONIA in the last 168 hours. ?Coagulation Profile: ?No results for input(s): INR, PROTIME in the last 168 hours. ?Cardiac Enzymes: ?No results for input(s): CKTOTAL, CKMB, CKMBINDEX, TROPONINI in the last 168 hours. ?BNP (last 3 results) ?No results for input(s): PROBNP in the last 8760 hours. ?HbA1C: ?No results for input(s): HGBA1C in the last 72 hours. ?CBG: ?No results for input(s): GLUCAP in the last 168 hours. ?Lipid Profile: ?No results for input(s): CHOL, HDL, LDLCALC, TRIG, CHOLHDL, LDLDIRECT in the last 72 hours. ?Thyroid Function Tests: ?No results for input(s): TSH, T4TOTAL, FREET4, T3FREE, THYROIDAB in the last 72 hours. ?Anemia Panel: ?No results for input(s): VITAMINB12, FOLATE, FERRITIN, TIBC, IRON, RETICCTPCT in the last 72 hours. ?Sepsis  Labs: ?No results for input(s): PROCALCITON, LATICACIDVEN in the last 168 hours. ? ? ?Recent Results (from the past 240 hour(s))  ?Gastrointestinal Panel by PCR , Stool     Status: Abnormal  ? Collection Time: 10/31/21  8:20 AM  ? Specimen: Stool  ?Result Value Ref Range Status  ? Campylobacter species NOT DETECTED NOT DETECTED Final  ? Plesimonas shigelloides NOT DETECTED NOT DETECTED Final  ? Salmonella species NOT DETECTED NOT DETECTED Final  ? Yersinia enterocolitica NOT DETECTED NOT DETECTED Final  ? Vibrio species NOT DETECTED NOT DETECTED Final  ? Vibrio cholerae NOT DETECTED NOT DETECTED Final  ? Enteroaggregative E coli (EAEC) NOT DETECTED NOT DETECTED Final  ? Enteropathogenic E coli (EPEC) NOT DETECTED NOT DETECTED Final  ? Enterotoxigenic E coli (ETEC) DETECTED (A) NOT DETECTED Final  ?  Comment: RESULT CALLED TO, READ BACK BY AND VERIFIED WITH: ?JASMYNE DAVIS 10/31/21 1012 MW ?  ? Shiga like toxin producing E coli (STEC) NOT DETECTED NOT DETECTED Final  ? Shigella/Enteroinvasive E coli (EIEC) NOT DETECTED NOT DETECTED Final  ? Cryptosporidium NOT DETECTED NOT DETECTED Final  ? Cyclospora cayetanensis NOT DETECTED NOT DETECTED Final  ? Entamoeba histolytica NOT DETECTED NOT DETECTED Final  ? Giardia lamblia NOT DETECTED NOT DETECTED Final  ? Adenovirus F40/41 NOT DETECTED NOT DETECTED Final  ? Astrovirus NOT DETECTED NOT DETECTED Final  ? Norovirus GI/GII NOT DETECTED NOT DETECTED Final  ? Rotavirus A NOT DETECTED NOT DETECTED Final  ? Sapovirus (I, II, IV, and V) NOT DETECTED NOT DETECTED Final  ?  Comment: Performed at [facility], [address] [place], [place] [address]  ?C Difficile Quick Screen w PCR reflex     Status: None  ? Collection Time: 10/31/21  8:25 AM  ? Specimen: Stool  ?Result Value Ref Range Status  ? C Diff antigen NEGATIVE NEGATIVE Final  ? C Diff toxin NEGATIVE NEGATIVE Final  ? C Diff interpretation No C. difficile detected.  Final  ?  Comment: Performed at [facility], [address], [place], [place] [address]  ?  ? ? ? ? ? ?Radiology Studies: ?No results found. ? ? ? ? ? ?Scheduled Meds: ? feeding supplement  237 mL Oral TID BM  ? heparin injection (subcutaneous)  5,000 Units Subcutaneous Q8H  ? hydrALAZINE  50 mg Oral Q8H  ? multivitamin with minerals  1 tablet Oral Daily  ? sodium bicarbonate  650 mg Oral BID  ? vitamin B-12  500 mcg Oral Daily  ? ?Continuous Infusions: ? sodium chloride Stopped (11/07/21 0344)  ? sodium chloride Stopped (11/03/21 0438)  ? ? ?Assessment & Plan: ?  ?Principal Problem: ?  AKI (acute kidney  injury) ([employer]) ?Active Problems: ?  Major depression in remission [facility]) ?  GERD (gastroesophageal reflux disease) ?  HTN (hypertension) ?  Carotid stenosis, asymptomatic, minimal ?  Benign essential HTN ?  Acute kidney failure, unspecified ([place]) ?  Chronic kidney disease, stage 3 unspecified ([place]) ?  Monoclonal gammopathy ?  Enterotoxigenic Escherichia coli infection ?  Leukocytosis ?  Malnutrition of moderate degree ?  Colitis ? ? ?AKI (acute kidney injury) ([place]) ?- Presumed secondary to contrast nephropathy in setting of recent CT studies involving contrast ?Was on ivf , d/c'd due to edema  ?3/31 improved close to baseline with IV fluids  ?Avoid nephrotoxic meds ?Bicarb for acidosis ? ? ? ?  ?Leukocytosis ?-Likely secondary to above colitis ?3/31 WBC stabilizing  ? ? ? ? ? ?  ?Enterotoxigenic Escherichia coli infection ?- GI and C. difficile PCR panel reviewed. CDiff is neg. Stool study is pos for ETEC ?-Given hx of recurrent colitis, GI was consulted at time of presentation ?-Continued on empiric zosyn ?-GI following. Pt underwent colonoscopy with findings of patchy bullous blebs and colitis, appearing ischemic. 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 Vascular surgery had recommended CT angio once renal function normalizes ?3/29 GI will f/u as outpatient.  I will  sign off ?3/30 reached out to vascular today about plans since renal function improved if still moving forward to obtaining cta, awaiting response. ?Did explain to pt possibility if proceed with cta, may go

## 2021-11-09 DIAGNOSIS — N179 Acute kidney failure, unspecified: Secondary | ICD-10-CM | POA: Diagnosis not present

## 2021-11-09 LAB — CBC
HCT: 37.2 % (ref 36.0–46.0)
Hemoglobin: 11.7 g/dL — ABNORMAL LOW (ref 12.0–15.0)
MCH: 28.2 pg (ref 26.0–34.0)
MCHC: 31.5 g/dL (ref 30.0–36.0)
MCV: 89.6 fL (ref 80.0–100.0)
Platelets: 523 10*3/uL — ABNORMAL HIGH (ref 150–400)
RBC: 4.15 MIL/uL (ref 3.87–5.11)
RDW: 16.8 % — ABNORMAL HIGH (ref 11.5–15.5)
WBC: 16.2 10*3/uL — ABNORMAL HIGH (ref 4.0–10.5)
nRBC: 0 % (ref 0.0–0.2)

## 2021-11-09 LAB — BASIC METABOLIC PANEL
Anion gap: 5 (ref 5–15)
BUN: 30 mg/dL — ABNORMAL HIGH (ref 8–23)
CO2: 23 mmol/L (ref 22–32)
Calcium: 8.4 mg/dL — ABNORMAL LOW (ref 8.9–10.3)
Chloride: 112 mmol/L — ABNORMAL HIGH (ref 98–111)
Creatinine, Ser: 1.01 mg/dL — ABNORMAL HIGH (ref 0.44–1.00)
GFR, Estimated: 55 mL/min — ABNORMAL LOW (ref >60)
Glucose, Bld: 91 mg/dL (ref 70–99)
Potassium: 4.2 mmol/L (ref 3.5–5.1)
Sodium: 140 mmol/L (ref 135–145)

## 2021-11-09 MED ORDER — FUROSEMIDE 20 MG PO TABS
20.0000 mg | ORAL_TABLET | Freq: Once | ORAL | Status: AC
  Administered 2021-11-09: 20 mg via ORAL
  Filled 2021-11-09: qty 1

## 2021-11-09 MED ORDER — RISAQUAD PO CAPS
2.0000 | ORAL_CAPSULE | Freq: Every day | ORAL | Status: DC
  Administered 2021-11-09 – 2021-11-14 (×5): 2 via ORAL
  Filled 2021-11-09 (×5): qty 2

## 2021-11-09 NOTE — Progress Notes (Signed)
[facility] ? ?Daily Progress Note ? ? ?Subjective  -  ? ?Marilyn Sharp is an 86 year old female that presented to [facility] with concerns for ischemic colitis.  Mesenteric ultrasound was obtained which showed a likely greater than 70% stenosis of the celiac artery.  The patient also suffered an acute kidney injury during her hospitalization so further evaluation with CT angiogram was not an option initially.  Currently the patient's renal function has improved as has her abdominal symptoms.  She continues to have diarrhea but it is no longer bloody.  She notes that prior to admission the patient had a 10 pound weight loss as well as fatigue and poor energy.  Her other large concern is leg swelling has been ongoing for the last several months.  The patient notes that she is prescribed diuretics and that she is compliant however her family and power of attorney notes that she has not was compliant with her diuretics. ? ?Objective ?Vitals:  ? 11/08/21 0500 11/08/21 0900 11/08/21 1558 11/08/21 2011  ?BP: (!) 142/74 132/84 (!) 148/81 (!) 148/84  ?Pulse: 79 82 83 81  ?Resp: '20 18 18 20  '$ ?Temp: 98.6 ?F (37 ?C) 97.8 ?F (36.6 ?C) 98.1 ?F (36.7 ?C) 98.5 ?F (36.9 ?C)  ?TempSrc: Oral   Oral  ?SpO2: 94% 94% 95% 93%  ?Weight:      ?Height:      ? ? ?Intake/Output Summary (Last 24 hours) at 11/09/2021 0015 ?Last data filed at 11/08/2021 1016 ?Gross per 24 hour  ?Intake 360 ml  ?Output --  ?Net 360 ml  ? ? ?PULM  CTAB ?CV  RRR ?VASC  2+ pitting edema extending towards upper thigh ? ?Laboratory ?CBC ?   ?Component Value Date/Time  ? WBC 18.5 (H) 11/08/2021 1028  ? HGB 12.9 11/08/2021 1028  ? HCT 40.8 11/08/2021 1028  ? PLT 600 (H) 11/08/2021 1028  ? ? ?BMET ?   ?Component Value Date/Time  ? NA 141 11/06/2021 0407  ? K 3.8 11/06/2021 0407  ? CL 115 (H) 11/06/2021 0407  ? CO2 20 (L) 11/06/2021 0407  ? GLUCOSE 94 11/06/2021 0407  ? BUN 21 11/06/2021 0407  ? CREATININE 1.18 (H) 11/07/2021 0339   ? CREATININE 0.79 02/16/2015 1516  ? CALCIUM 7.9 (L) 11/06/2021 0407  ? GFRNONAA 46 (L) 11/07/2021 0339  ? GFRAA 39 (L) 01/05/2020 1442  ? ? ?Assessment/Planning: ?Celiac artery stenosis ? ?The patient does have evidence of a 70% stenosis with a mesenteric duplex.  While the stenosis was not the cause of her admission, there are lingering concerns by the patient and family that her lack of appetite may be related to the 70% stenosis.  Typically we will plan on ordering a CT angiogram for further evaluation however the patient had an acute kidney injury during hospitalization, and while the kidneys have now recovered to baseline levels, the amount of contrast utilized during the CT angiogram may possibly cause repeat injury.  As an alternative, we discussed undergoing mesenteric angiogram which we can use a decreased amount of dye for evaluation.  In the event that the stenosis is any significant we can also intervene as well.  I discussed the risk, benefits and alternatives with the patient as well as power of attorney.  They are agreeable to proceed.  We will plan on intervention next week. ? ?Leg swelling ? ?The patient is also concerned with the significant lower extremity edema she has been having for the  last 6 months.  Currently the edema is significant to the level of the thigh.  I suspect that in this instance the swelling has been worsened by her recent hospitalization as well as the acute kidney injury as well as the recently noted diastolic cardiac dysfunction.  The patient also not been able to take her diuretics due to her acute kidney injury.  In this instance controlling her swelling will be difficult until she is able to be diuresed.  We will defer this to the primary team. ? ? ? ?Kris Hartmann ? ?11/09/2021, 12:15 AM ? ? ? ?  ?

## 2021-11-09 NOTE — Plan of Care (Signed)
  Problem: Activity: Goal: Risk for activity intolerance will decrease Outcome: Progressing   Problem: Coping: Goal: Level of anxiety will decrease Outcome: Progressing   

## 2021-11-09 NOTE — Progress Notes (Signed)
Physical Therapy Treatment ?Patient Details ?Name: Marilyn Sharp ?MRN: [id number] ?DOB: [birth year] ?Today's Date: 11/09/2021 ? ? ?History of Present Illness [name] is an 86 year old female with with history of depression, anxiety, hyperlipidemia, hypertension, CKD 3B, GERD, MGUS, who presents emergency department for chief concerns of persistent colitis. ? ?  ?PT Comments  ? ? Pt was sitting in recliner upon arriving. She recently was able to get up and ambulate with mobility tech and did not want to perform PT at this time due to fatigue. She did request for assistance to get to [facility] to have BM/urinate. Author assist pt to [facility] however respectfully requested female assist going forward. Pt is from [place] and is planning to return to skilled side at [place]. Acute PT will continue to follow and progress as able per current POC.  ?  ?Recommendations for follow up therapy are one component of a multi-disciplinary discharge planning process, led by the attending physician.  Recommendations may be updated based on patient status, additional functional criteria and insurance authorization. ? ?Follow Up Recommendations ? Skilled nursing-short term rehab (<3 hours/day) ?  ?  ?Assistance Recommended at Discharge Intermittent Supervision/Assistance  ?Patient can return home with the following A little help with walking and/or transfers;A little help with bathing/dressing/bathroom;Help with stairs or ramp for entrance ?  ?Equipment Recommendations ? Rolling walker (2 wheels)  ?  ?   ?Precautions / Restrictions Precautions ?Precautions: Fall ?Restrictions ?Weight Bearing Restrictions: No  ?  ? ?Mobility ? Bed Mobility ?   ?General bed mobility comments: in recliner pre session and on [employer] with RN tech post session. ?  ? ?Transfers ?Overall transfer level: Needs assistance ?Equipment used: Rolling walker (2 wheels) ?Transfers: Sit to/from Stand ?Sit to Stand: Min guard ?  ? General transfer comment: pt stood from recliner with  Vcs for improved technique. CGA for safety. ?  ? ?Ambulation/Gait ?Ambulation/Gait assistance: Min guard ?Gait Distance (Feet): 8 Feet ?Assistive device: Rolling walker (2 wheels) ?Gait Pattern/deviations: Step-through pattern ?Gait velocity: decreased ?  ?  ?General Gait Details: pt was able to ambulate ~ 8 ft from recliner to [facility]. unwilling to ambulate into bathroom. pt was slightly agitated with female assisting her. ? ?  ?Balance Overall balance assessment: Needs assistance ?Sitting-balance support: No upper extremity supported, Feet supported ?Sitting balance-Leahy Scale: Good ?  ?  ?Standing balance support: Bilateral upper extremity supported, During functional activity ?Standing balance-Leahy Scale: Fair ?  ?  ?  ?Cognition Arousal/Alertness: Awake/alert ?Behavior During Therapy: [facility] for tasks assessed/performed ?Overall Cognitive Status: Within Functional Limits for tasks assessed ?   ?General Comments: pt was slightly frustrated that guy was assisting her to restroom. Due to this, author only assisted pt to [facility] and RN staff took over care. pt recently ambulated with mobility tech and requested not to ambulate again more at this time. ?  ?  ? ?  ?   ?   ? ?Pertinent Vitals/Pain Pain Assessment ?Pain Assessment: No/denies pain  ? ? ? ?PT Goals (current goals can now be found in the care plan section) Acute Rehab PT Goals ?Patient Stated Goal: to go home. ?Progress towards PT goals: Progressing toward goals ? ?  ?Frequency ? ? ? Min 2X/week ? ? ? ?  ?PT Plan Current plan remains appropriate  ? ? ?   ?AM-PAC PT "6 Clicks" Mobility   ?Outcome Measure ? Help needed turning from your back to your side while in a flat bed without  using bedrails?: A Little ?Help needed moving from lying on your back to sitting on the side of a flat bed without using bedrails?: A Little ?Help needed moving to and from a bed to a chair (including a wheelchair)?: A Little ?Help needed standing up from a chair using your arms (e.g.,  wheelchair or bedside chair)?: A Little ?Help needed to walk in hospital room?: A Little ?Help needed climbing 3-5 steps with a railing? : A Little ?6 Click Score: 18 ? ?  ?End of Session   ?Activity Tolerance: Patient tolerated treatment well ?Patient left: in chair;with chair alarm set;with family/visitor present ?Nurse Communication: Mobility status ?PT Visit Diagnosis: Unsteadiness on feet (R26.81);Other abnormalities of gait and mobility (R26.89);Repeated falls (R29.6);Muscle weakness (generalized) (M62.81);History of falling (Z91.81);Difficulty in walking, not elsewhere classified (R26.2) ?  ? ? ?Time: 1355-1404 ?PT Time Calculation (min) (ACUTE ONLY): 9 min ? ?Charges:  $Therapeutic Activity: 8-22 mins          ?          ?Julaine Fusi PTA ?11/09/21, 2:17 PM  ? ?

## 2021-11-09 NOTE — Progress Notes (Addendum)
?PROGRESS NOTE ? ? ? ?Doneisha Ivey Lydon  ATF:[id number] DOB: 1936/05/28 DOA: 10/30/2021 ?PCP: Idelle Crouch, MD  ? ? ?Brief Narrative:  ?Ms. Ta Swenor is an 86 year old female with with history of depression, anxiety, hyperlipidemia, hypertension, CKD 3B, GERD, MGUS, who presents emergency department for chief concerns of persistent colitis. ? ?Initial vitals in the emergency department show temperature of 98.8, respiration rate of 16, heart rate 81, blood pressure 149/87, SPO2 of 96% on room air. ? ?Serum sodium 136, potassium 3.6, chloride 99, bicarb 26, BUN 59, serum creatinine of 2.60, nonfasting blood glucose 118, GFR 18, WBC 22.6, hemoglobin 13.2, platelets of 512. ?  ?UA was negative for leukocytes and nitrates. ?  ?ED treatment: IV Zosyn 3.375 mg one-time dose, sodium chloride 500 mL bolus ? ?3/30-trying to increase po intake.  ?3/31 no issues. Po intake better. No complaints. Sitting in chair. Asking about if vascular will see her today. Discussed with patient no cta needed per vascular, but she insisting on speaking to them. Vascular team notified. ?4/1 no acute complaints.  Discussed vascular surgery's decision on angiogram.  Answered all her questions and friends questions who is present in the room. ? ? ?Consultants:  ?GI, vascular ? ?Procedures:  ? ?Antimicrobials:  ?Zosyn ? ? ?Subjective: ?Eating better, no nausea or vomiting ? ? ? ?Objective: ?Vitals:  ? 11/08/21 1558 11/08/21 2011 11/09/21 0519 11/09/21 0746  ?BP: (!) 148/81 (!) 148/84 (!) 159/84 (!) 148/90  ?Pulse: 83 81 77 83  ?Resp: '18 20 20 18  '$ ?Temp: 98.1 ?F (36.7 ?C) 98.5 ?F (36.9 ?C) 98.1 ?F (36.7 ?C) 97.8 ?F (36.6 ?C)  ?TempSrc:  Oral Oral Oral  ?SpO2: 95% 93% 95% 95%  ?Weight:      ?Height:      ? ? ?Intake/Output Summary (Last 24 hours) at 11/09/2021 0834 ?Last data filed at 11/09/2021 0650 ?Gross per 24 hour  ?Intake 240 ml  ?Output 200 ml  ?Net 40 ml  ? ?Filed Weights  ? 11/01/21 2200  ?Weight: 68.3 kg  ? ? ?Examination: ?Calm, NAD ?Cta no  w/r ?Reg s1/s2 no gallop ?Soft benign +bs ?No edema ?Aaoxox3  ?Mood and affect appropriate in current setting  ? ? ? ?Data Reviewed: I have personally reviewed following labs and imaging studies ? ?CBC: ?Recent Labs  ?Lab 11/04/21 ?2706 11/05/21 ?0510 11/06/21 ?0407 11/08/21 ?1028 11/09/21 ?0503  ?WBC 16.4* 17.1* 18.4* 18.5* 16.2*  ?HGB 12.0 11.7* 11.8* 12.9 11.7*  ?HCT 37.8 37.8 37.4 40.8 37.2  ?MCV 90.2 90.4 90.8 91.1 89.6  ?PLT 506* 451* 471* 600* 523*  ? ?Basic Metabolic Panel: ?Recent Labs  ?Lab 11/03/21 ?0504 11/04/21 ?2376 11/05/21 ?0510 11/06/21 ?0407 11/07/21 ?2831 11/09/21 ?0503  ?NA 142 143 144 141  --  140  ?K 3.2* 3.7 3.6 3.8  --  4.2  ?CL 112* 116* 119* 115*  --  112*  ?CO2 21* 20* 19* 20*  --  23  ?GLUCOSE 90 85 94 94  --  91  ?BUN 29* '20 20 21  '$ --  30*  ?CREATININE 1.58* 1.45* 1.33* 1.24* 1.18* 1.01*  ?CALCIUM 7.8* 7.7* 7.6* 7.9*  --  8.4*  ? ?GFR: ?Estimated Creatinine Clearance: 38 mL/min (A) (by C-G formula based on SCr of 1.01 mg/dL (H)). ?Liver Function Tests: ?Recent Labs  ?Lab 11/03/21 ?0504 11/04/21 ?5176 11/05/21 ?0510 11/06/21 ?0407  ?AST 12* 12* 10* 11*  ?ALT '9 8 8 9  '$ ?ALKPHOS 46 42 39 40  ?BILITOT 0.8 0.8  0.6 0.6  ?PROT 5.2* 4.7* 4.8* 4.8*  ?ALBUMIN 2.8* 2.5* 2.5* 2.4*  ? ?No results for input(s): LIPASE, AMYLASE in the last 168 hours. ?No results for input(s): AMMONIA in the last 168 hours. ?Coagulation Profile: ?No results for input(s): INR, PROTIME in the last 168 hours. ?Cardiac Enzymes: ?No results for input(s): CKTOTAL, CKMB, CKMBINDEX, TROPONINI in the last 168 hours. ?BNP (last 3 results) ?No results for input(s): PROBNP in the last 8760 hours. ?HbA1C: ?No results for input(s): HGBA1C in the last 72 hours. ?CBG: ?No results for input(s): GLUCAP in the last 168 hours. ?Lipid Profile: ?No results for input(s): CHOL, HDL, LDLCALC, TRIG, CHOLHDL, LDLDIRECT in the last 72 hours. ?Thyroid Function Tests: ?No results for input(s): TSH, T4TOTAL, FREET4, T3FREE, THYROIDAB in the last 72  hours. ?Anemia Panel: ?No results for input(s): VITAMINB12, FOLATE, FERRITIN, TIBC, IRON, RETICCTPCT in the last 72 hours. ?Sepsis Labs: ?No results for input(s): PROCALCITON, LATICACIDVEN in the last 168 hours. ? ? ?Recent Results (from the past 240 hour(s))  ?Gastrointestinal Panel by PCR , Stool     Status: Abnormal  ? Collection Time: 10/31/21  8:20 AM  ? Specimen: Stool  ?Result Value Ref Range Status  ? Campylobacter species NOT DETECTED NOT DETECTED Final  ? Plesimonas shigelloides NOT DETECTED NOT DETECTED Final  ? Salmonella species NOT DETECTED NOT DETECTED Final  ? Yersinia enterocolitica NOT DETECTED NOT DETECTED Final  ? Vibrio species NOT DETECTED NOT DETECTED Final  ? Vibrio cholerae NOT DETECTED NOT DETECTED Final  ? Enteroaggregative E coli (EAEC) NOT DETECTED NOT DETECTED Final  ? Enteropathogenic E coli (EPEC) NOT DETECTED NOT DETECTED Final  ? Enterotoxigenic E coli (ETEC) DETECTED (A) NOT DETECTED Final  ?  Comment: RESULT CALLED TO, READ BACK BY AND VERIFIED WITH: ?JASMYNE DAVIS 10/31/21 1012 MW ?  ? Shiga like toxin producing E coli (STEC) NOT DETECTED NOT DETECTED Final  ? Shigella/Enteroinvasive E coli (EIEC) NOT DETECTED NOT DETECTED Final  ? Cryptosporidium NOT DETECTED NOT DETECTED Final  ? Cyclospora cayetanensis NOT DETECTED NOT DETECTED Final  ? Entamoeba histolytica NOT DETECTED NOT DETECTED Final  ? Giardia lamblia NOT DETECTED NOT DETECTED Final  ? Adenovirus F40/41 NOT DETECTED NOT DETECTED Final  ? Astrovirus NOT DETECTED NOT DETECTED Final  ? Norovirus GI/GII NOT DETECTED NOT DETECTED Final  ? Rotavirus A NOT DETECTED NOT DETECTED Final  ? Sapovirus (I, II, IV, and V) NOT DETECTED NOT DETECTED Final  ?  Comment: Performed at [facility], [address] [place], [place] [address]  ?C Difficile Quick Screen w PCR reflex     Status: None  ? Collection Time: 10/31/21  8:25 AM  ? Specimen: Stool  ?Result Value Ref Range Status  ? C Diff antigen NEGATIVE NEGATIVE Final  ? C  Diff toxin NEGATIVE NEGATIVE Final  ? C Diff interpretation No C. difficile detected.  Final  ?  Comment: Performed at [facility], [address] [place], [place] [address]  ?  ? ? ? ? ? ?Radiology Studies: ?No results found. ? ? ? ? ? ?Scheduled Meds: ? feeding supplement  237 mL Oral TID BM  ? furosemide  20 mg Oral Once  ? heparin injection (subcutaneous)  5,000 Units Subcutaneous Q8H  ? hydrALAZINE  50 mg Oral Q8H  ? multivitamin with minerals  1 tablet Oral Daily  ? sodium bicarbonate  650 mg Oral BID  ? vitamin B-12  500 mcg Oral Daily  ? ?Continuous Infusions: ?  sodium chloride Stopped (11/07/21 0344)  ? sodium chloride Stopped (11/03/21 0438)  ? ? ?Assessment & Plan: ?  ?Principal Problem: ?  AKI (acute kidney injury) ([place]) ?Active Problems: ?  Major depression in remission [facility]) ?  GERD (gastroesophageal reflux disease) ?  HTN (hypertension) ?  Carotid stenosis, asymptomatic, minimal ?  Benign essential HTN ?  Acute kidney failure, unspecified ([place]) ?  Chronic kidney disease, stage 3 unspecified ([place]) ?  Monoclonal gammopathy ?  Enterotoxigenic Escherichia coli infection ?  Leukocytosis ?  Malnutrition of moderate degree ?  Colitis ? ? ?AKI (acute kidney injury) ([place]) ?- Presumed secondary to contrast nephropathy in setting of recent CT studies involving contrast ?Was on ivf , d/c'd due to edema  ?4/1 improved, creatinine 1.01 ?Avoid nephrotoxic meds ?Acidosis improved with bicarb we will continue ?Since giving Lasix 20 mg x 1 monitor ? ? ? ? ?  ?Leukocytosis ?-Likely secondary to above colitis ?4/1 WBC trending down  ?Afebrile  ? ? ? ? ? ? ? ?  ?Enterotoxigenic Escherichia coli infection ?- GI and C. difficile PCR panel reviewed. CDiff is neg. Stool study is pos for ETEC ?-Given hx of recurrent colitis, GI was consulted at time of presentation ?-Continued on empiric zosyn ?-GI following. Pt underwent colonoscopy with findings of patchy bullous blebs and colitis, appearing ischemic. 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 Vascular surgery had recommended CT angio once renal function normalizes ?3/29 GI will f/u a

## 2021-11-09 NOTE — Progress Notes (Signed)
Mobility Specialist - Progress Note ? ? 11/09/21 1300  ?Mobility  ?Activity Ambulated independently in hallway;Stood at bedside  ?Level of Assistance Standby assist, set-up cues, supervision of patient - no hands on  ?Assistive Device Front wheel walker  ?Distance Ambulated (ft) 60 ft  ?Activity Response Tolerated well  ?$Mobility charge 1 Mobility  ? ? ?Pt sitting in chair upon arrival using RA. Pt completes STS with MinA and ambulates 73f with shuffling gait and returns to chair -- vc for hand placement to descend safely into chair. Pt left with needs in reach and chair alarm set. ? ?MMerrily Brittle?Mobility Specialist ?11/09/21, 1:30 PM ? ? ? ? ?

## 2021-11-10 DIAGNOSIS — N179 Acute kidney failure, unspecified: Secondary | ICD-10-CM | POA: Diagnosis not present

## 2021-11-10 LAB — BASIC METABOLIC PANEL
Anion gap: 7 (ref 5–15)
BUN: 30 mg/dL — ABNORMAL HIGH (ref 8–23)
CO2: 23 mmol/L (ref 22–32)
Calcium: 8.8 mg/dL — ABNORMAL LOW (ref 8.9–10.3)
Chloride: 110 mmol/L (ref 98–111)
Creatinine, Ser: 1.06 mg/dL — ABNORMAL HIGH (ref 0.44–1.00)
GFR, Estimated: 52 mL/min — ABNORMAL LOW (ref >60)
Glucose, Bld: 89 mg/dL (ref 70–99)
Potassium: 4.2 mmol/L (ref 3.5–5.1)
Sodium: 140 mmol/L (ref 135–145)

## 2021-11-10 LAB — CBC
HCT: 39.5 % (ref 36.0–46.0)
Hemoglobin: 12.4 g/dL (ref 12.0–15.0)
MCH: 28.3 pg (ref 26.0–34.0)
MCHC: 31.4 g/dL (ref 30.0–36.0)
MCV: 90.2 fL (ref 80.0–100.0)
Platelets: 584 10*3/uL — ABNORMAL HIGH (ref 150–400)
RBC: 4.38 MIL/uL (ref 3.87–5.11)
RDW: 17.1 % — ABNORMAL HIGH (ref 11.5–15.5)
WBC: 17.3 10*3/uL — ABNORMAL HIGH (ref 4.0–10.5)
nRBC: 0 % (ref 0.0–0.2)

## 2021-11-10 LAB — MAGNESIUM: Magnesium: 1.9 mg/dL (ref 1.7–2.4)

## 2021-11-10 NOTE — Progress Notes (Addendum)
?PROGRESS NOTE ? ? ? ?Denajah Farias Rendell  UXN:[id number] DOB: 25-Apr-1936 DOA: 10/30/2021 ?PCP: Idelle Crouch, MD  ? ? ?Brief Narrative:  ?Ms. Dayra [place] is an 86 year old female with with history of depression, anxiety, hyperlipidemia, hypertension, CKD 3B, GERD, MGUS, who presents emergency department for chief concerns of persistent colitis. ? ?Initial vitals in the emergency department show temperature of 98.8, respiration rate of 16, heart rate 81, blood pressure 149/87, SPO2 of 96% on room air. ? ?Serum sodium 136, potassium 3.6, chloride 99, bicarb 26, BUN 59, serum creatinine of 2.60, nonfasting blood glucose 118, GFR 18, WBC 22.6, hemoglobin 13.2, platelets of 512. ?  ?UA was negative for leukocytes and nitrates. ?  ?ED treatment: IV Zosyn 3.375 mg one-time dose, sodium chloride 500 mL bolus ? ?3/30-trying to increase po intake.  ?3/31 no issues. Po intake better. No complaints. Sitting in chair. Asking about if vascular will see her today. Discussed with patient no cta needed per vascular, but she insisting on speaking to them. Vascular team notified. ?4/1 no acute complaints.  Discussed vascular surgery's decision on angiogram.  Answered all her questions and friends questions who is present in the room. ?4/2 family at bedside.  POA present asked questions about vascular procedure.  All questions and concerns were addressed to my best of my ability.  Discussed echo results and her lower extremity edema.  Told family she will need to follow-up with a cardiologist as outpatient as she has diastolic stage II dysfunction and will need further management of her lower extremity edema. ? ? ?Consultants:  ?GI, vascular ? ?Procedures:  ? ?Antimicrobials:  ?Zosyn ? ? ?Subjective: ?Does not report shortness of breath while sitting.  Lower extremity edema mildly better.  No chest pain.  No other symptoms ? ? ? ?Objective: ?Vitals:  ? 11/09/21 2107 11/10/21 0330 11/10/21 2542 11/10/21 7062  ?BP: (!) 157/84 129/75 129/76  116/69  ?Pulse:  80  80  ?Resp:  17  15  ?Temp:  98.4 ?F (36.9 ?C)  97.9 ?F (36.6 ?C)  ?TempSrc:  Oral  Oral  ?SpO2:  94%  95%  ?Weight:      ?Height:      ? ? ?Intake/Output Summary (Last 24 hours) at 11/10/2021 0853 ?Last data filed at 11/10/2021 3762 ?Gross per 24 hour  ?Intake 720 ml  ?Output 175 ml  ?Net 545 ml  ? ?Filed Weights  ? 11/01/21 2200  ?Weight: 68.3 kg  ? ? ?Examination: ?Calm, NAD ?Cta no w/r ?Reg s1/s2 no gallop ?Soft benign +bs ?+LE edema, mildly down ?Aaoxox3  ?Mood and affect appropriate in current setting  ? ? ? ?Data Reviewed: I have personally reviewed following labs and imaging studies ? ?CBC: ?Recent Labs  ?Lab 11/05/21 ?0510 11/06/21 ?0407 11/08/21 ?1028 11/09/21 ?8315 11/10/21 ?1761  ?WBC 17.1* 18.4* 18.5* 16.2* 17.3*  ?HGB 11.7* 11.8* 12.9 11.7* 12.4  ?HCT 37.8 37.4 40.8 37.2 39.5  ?MCV 90.4 90.8 91.1 89.6 90.2  ?PLT 451* 471* 600* 523* 584*  ? ?Basic Metabolic Panel: ?Recent Labs  ?Lab 11/04/21 ?6073 11/05/21 ?0510 11/06/21 ?0407 11/07/21 ?7106 11/09/21 ?2694 11/10/21 ?8546  ?NA 143 144 141  --  140 140  ?K 3.7 3.6 3.8  --  4.2 4.2  ?CL 116* 119* 115*  --  112* 110  ?CO2 20* 19* 20*  --  23 23  ?GLUCOSE 85 94 94  --  91 89  ?BUN '20 20 21  '$ --  30* 30*  ?  CREATININE 1.45* 1.33* 1.24* 1.18* 1.01* 1.06*  ?CALCIUM 7.7* 7.6* 7.9*  --  8.4* 8.8*  ?MG  --   --   --   --   --  1.9  ? ?GFR: ?Estimated Creatinine Clearance: 36.2 mL/min (A) (by C-G formula based on SCr of 1.06 mg/dL (H)). ?Liver Function Tests: ?Recent Labs  ?Lab 11/04/21 ?8299 11/05/21 ?0510 11/06/21 ?0407  ?AST 12* 10* 11*  ?ALT '8 8 9  '$ ?ALKPHOS 42 39 40  ?BILITOT 0.8 0.6 0.6  ?PROT 4.7* 4.8* 4.8*  ?ALBUMIN 2.5* 2.5* 2.4*  ? ?No results for input(s): LIPASE, AMYLASE in the last 168 hours. ?No results for input(s): AMMONIA in the last 168 hours. ?Coagulation Profile: ?No results for input(s): INR, PROTIME in the last 168 hours. ?Cardiac Enzymes: ?No results for input(s): CKTOTAL, CKMB, CKMBINDEX, TROPONINI in the last 168 hours. ?BNP  (last 3 results) ?No results for input(s): PROBNP in the last 8760 hours. ?HbA1C: ?No results for input(s): HGBA1C in the last 72 hours. ?CBG: ?No results for input(s): GLUCAP in the last 168 hours. ?Lipid Profile: ?No results for input(s): CHOL, HDL, LDLCALC, TRIG, CHOLHDL, LDLDIRECT in the last 72 hours. ?Thyroid Function Tests: ?No results for input(s): TSH, T4TOTAL, FREET4, T3FREE, THYROIDAB in the last 72 hours. ?Anemia Panel: ?No results for input(s): VITAMINB12, FOLATE, FERRITIN, TIBC, IRON, RETICCTPCT in the last 72 hours. ?Sepsis Labs: ?No results for input(s): PROCALCITON, LATICACIDVEN in the last 168 hours. ? ? ?No results found for this or any previous visit (from the past 240 hour(s)). ?  ? ? ? ? ? ?Radiology Studies: ?No results found. ? ? ? ? ? ?Scheduled Meds: ? acidophilus  2 capsule Oral Daily  ? feeding supplement  237 mL Oral TID BM  ? heparin injection (subcutaneous)  5,000 Units Subcutaneous Q8H  ? hydrALAZINE  50 mg Oral Q8H  ? multivitamin with minerals  1 tablet Oral Daily  ? sodium bicarbonate  650 mg Oral BID  ? vitamin B-12  500 mcg Oral Daily  ? ?Continuous Infusions: ? sodium chloride Stopped (11/07/21 0344)  ? sodium chloride Stopped (11/03/21 0438)  ? ? ?Assessment & Plan: ?  ?Principal Problem: ?  AKI (acute kidney injury) ([place]) ?Active Problems: ?  Major depression in remission [facility]) ?  GERD (gastroesophageal reflux disease) ?  HTN (hypertension) ?  Carotid stenosis, asymptomatic, minimal ?  Benign essential HTN ?  Acute kidney failure, unspecified ([place]) ?  Chronic kidney disease, stage 3 unspecified ([place]) ?  Monoclonal gammopathy ?  Enterotoxigenic Escherichia coli infection ?  Leukocytosis ?  Malnutrition of moderate degree ?  Colitis ? ? ?AKI (acute kidney injury) ([place]) ?- Presumed secondary to contrast nephropathy in setting of recent CT studies involving contrast ?Was on ivf , d/c'd due to edema  ?Avoid nephrotoxic meds ?Acidosis improved with bicarb we will continue ?Since  giving Lasix 20 mg x 1 monitor ?4/2 still stable after lasix. Told family and pt will hold on giving more diuretics since going for angiogram ? ? ? ? ?  ?Leukocytosis ?-Likely secondary to above colitis ?4/2 still elevated, no fever ? ? ? ? ? ? ? ?  ?Enterotoxigenic Escherichia coli infection ?- GI and C. difficile PCR panel reviewed. CDiff is neg. Stool study is pos for ETEC ?-Given hx of recurrent colitis, GI was consulted at time of presentation ?-Continued on empiric zosyn ?-GI following. Pt underwent colonoscopy with findings of patchy bullous blebs and colitis, appearing ischemic. 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 Vascular surgery had recommended CT angio once renal function normalizes ?3/29 GI will f/u as outpatient.  I will sign off ?3/30 reached out to vascular today about plans since renal function improved if still moving forward to obtaining cta, awaiting response. ?Did explain to pt possibility if proceed with cta, may go into aki again, she verbalizes an understanding .(Explained this to her poa cousin too) ?4/2 plan for angiogram next week instead of CTA since he will require less contrast  ? ? ? ? ? ? ? ?LE Edema ?More chronic. Was on diuretics as outpt but not taking ?Also low albumin  which not helping with edema ?Will give lasix '20mg'$  po x1  (monitor bp, creatinine) ?4/2 is on torsemide at home. LE edema not much improved with it. Echo reviewed with her and family- diastolic dysfxn stage II>>>rec. Referring pt to [facility] cardiology on discharge.  ? ? ? ?Moderate Malnutrition  ?Due to social/environmental circumstances as evidence by moderate fat depletion, mild muscle depletion ?RD following-please see note ?4/2 p.o. intake better continue with protein supplementation  ? ? ?  ?  ? ? ?NAG acidosis ?Possibly 2/2 AKI ?Improved  ?continue bicarb ?  ?Benign essential HTN ?- Cont hydralazine '50mg'$  q8h ?- Hydralazine 10 mg  p.o. every 6 hours as needed for SBP greater than 175 ?  ? ? ? ?DVT prophylaxis: Heparin ?Code Status: Full ?Family Communication: Family at bedside ?Disposition Plan:  ?Status is: Inpatient ?Remains inpa

## 2021-11-10 NOTE — Progress Notes (Signed)
Mobility Specialist - Progress Note ? ? 11/10/21 1300  ?Mobility  ?Activity Ambulated with assistance in hallway;Stood at bedside  ?Level of Assistance Standby assist, set-up cues, supervision of patient - no hands on  ?Assistive Device Front wheel walker  ?Distance Ambulated (ft) 80 ft  ?Activity Response Tolerated well  ?$Mobility charge 1 Mobility  ? ? ?Pt is sitting in chair upon arrival using RA. Pt completes STS with MinA and ambulates 7f with SBA voicing mild LE weakness. [facility] with ModI + vc for hand placement. Pt completes STS and ambulates back to chair with needs in reach and alarm set. ? ?MMerrily Brittle?Mobility Specialist ?11/10/21, 1:15 PM; ? ? ?

## 2021-11-11 DIAGNOSIS — R2240 Localized swelling, mass and lump, unspecified lower limb: Secondary | ICD-10-CM

## 2021-11-11 DIAGNOSIS — N179 Acute kidney failure, unspecified: Secondary | ICD-10-CM | POA: Diagnosis not present

## 2021-11-11 DIAGNOSIS — E46 Unspecified protein-calorie malnutrition: Secondary | ICD-10-CM

## 2021-11-11 DIAGNOSIS — I774 Celiac artery compression syndrome: Secondary | ICD-10-CM

## 2021-11-11 NOTE — TOC Progression Note (Signed)
Transition of Care (TOC) - Progression Note  ? ? ?Patient Details  ?Name: Marilyn Sharp ?MRN: [id number] ?Date of Birth: July 19, 1936 ? ?Transition of Care (TOC) CM/SW Contact  ?Beverly Sessions, RN ?Phone Number: ?11/11/2021, 3:26 PM ? ?Clinical Narrative:    ? ?Plan for mesenteric angiogram this week.  Not determined when procedure will be scheduled. Seth Bake at [facility] updated  ? ?Expected Discharge Plan: Home/Self Care ?Barriers to Discharge: Continued Medical Work up ? ?Expected Discharge Plan and Services ?Expected Discharge Plan: Home/Self Care ?  ?  ?  ?Living arrangements for the past 2 months: [place] ?                ?  ?  ?  ?  ?  ?  ?  ?  ?  ?  ? ? ?Social Determinants of Health (SDOH) Interventions ?  ? ?Readmission Risk Interventions ?   ? View : No data to display.  ?  ?  ?  ? ? ?

## 2021-11-11 NOTE — Care Management Important Message (Signed)
Important Message ? ?Patient Details  ?Name: Marilyn Sharp ?MRN: [id number] ?Date of Birth: 1935-09-20 ? ? ?Medicare Important Message Given:  Yes ? ? ? ? ?[name] ?11/11/2021, 11:45 AM ?

## 2021-11-11 NOTE — Progress Notes (Signed)
?PROGRESS NOTE ? ? ? ?Marilyn Sharp  ZWC:[id number] DOB: 05-Aug-1936 DOA: 10/30/2021 ?PCP: Idelle Crouch, MD  ? ? ?Brief Narrative:  ?[name] is an 86 year old female with with history of depression, anxiety, hyperlipidemia, hypertension, CKD 3B, GERD, MGUS, who presents emergency department for chief concerns of persistent colitis. ? ?Initial vitals in the emergency department show temperature of 98.8, respiration rate of 16, heart rate 81, blood pressure 149/87, SPO2 of 96% on room air. ? ?Serum sodium 136, potassium 3.6, chloride 99, bicarb 26, BUN 59, serum creatinine of 2.60, nonfasting blood glucose 118, GFR 18, WBC 22.6, hemoglobin 13.2, platelets of 512. ?  ?UA was negative for leukocytes and nitrates. ?  ?ED treatment: IV Zosyn 3.375 mg one-time dose, sodium chloride 500 mL bolus ? ?3/30-trying to increase po intake.  ?3/31 no issues. Po intake better. No complaints. Sitting in chair. Asking about if vascular will see her today. Discussed with patient no cta needed per vascular, but she insisting on speaking to them. Vascular team notified. ?4/1 no acute complaints.  Discussed vascular surgery's decision on angiogram.  Answered all her questions and friends questions who is present in the room. ?4/2 family at bedside.  POA present asked questions about vascular procedure.  All questions and concerns were addressed to my best of my ability.  Discussed echo results and her lower extremity edema.  Told family she will need to follow-up with a cardiologist as outpatient as she has diastolic stage II dysfunction and will need further management of her lower extremity edema. ?4/3 has no new complaints.  Doing better with PT.  Reports lower extremity edema ? ? ?Consultants:  ?GI, vascular ? ?Procedures:  ? ?Antimicrobials:  ?Zosyn ? ? ?Subjective: ?No new complaints.  No chest pain.  Shortness of breath little bit better ? ? ?Objective: ?Vitals:  ? 11/10/21 0812 11/10/21 1542 11/10/21 1926 11/11/21 0454   ?BP: 116/69 110/68 137/74 135/76  ?Pulse: 80 84 87 81  ?Resp: '15 15 18 18  '$ ?Temp: 97.9 ?F (36.6 ?C) 98.3 ?F (36.8 ?C) 98.5 ?F (36.9 ?C) 98.2 ?F (36.8 ?C)  ?TempSrc: Oral Oral    ?SpO2: 95% 95% 95% 94%  ?Weight:      ?Height:      ? ? ?Intake/Output Summary (Last 24 hours) at 11/11/2021 0834 ?Last data filed at 11/11/2021 0500 ?Gross per 24 hour  ?Intake --  ?Output 350 ml  ?Net -350 ml  ? ?Filed Weights  ? 11/01/21 2200  ?Weight: 68.3 kg  ? ? ?Examination: ?Calm, NAD ?Cta no w/r ?Reg s1/s2 no gallop ?Soft benign +bs ?Lower extremity edema down ?Aaoxox3  ?Mood and affect appropriate in current setting  ? ? ? ?Data Reviewed: I have personally reviewed following labs and imaging studies ? ?CBC: ?Recent Labs  ?Lab 11/05/21 ?0510 11/06/21 ?0407 11/08/21 ?1028 11/09/21 ?2353 11/10/21 ?6144  ?WBC 17.1* 18.4* 18.5* 16.2* 17.3*  ?HGB 11.7* 11.8* 12.9 11.7* 12.4  ?HCT 37.8 37.4 40.8 37.2 39.5  ?MCV 90.4 90.8 91.1 89.6 90.2  ?PLT 451* 471* 600* 523* 584*  ? ?Basic Metabolic Panel: ?Recent Labs  ?Lab 11/05/21 ?0510 11/06/21 ?0407 11/07/21 ?3154 11/09/21 ?0086 11/10/21 ?7619  ?NA 144 141  --  140 140  ?K 3.6 3.8  --  4.2 4.2  ?CL 119* 115*  --  112* 110  ?CO2 19* 20*  --  23 23  ?GLUCOSE 94 94  --  91 89  ?BUN 20 21  --  30*  30*  ?CREATININE 1.33* 1.24* 1.18* 1.01* 1.06*  ?CALCIUM 7.6* 7.9*  --  8.4* 8.8*  ?MG  --   --   --   --  1.9  ? ?GFR: ?Estimated Creatinine Clearance: 36.2 mL/min (A) (by C-G formula based on SCr of 1.06 mg/dL (H)). ?Liver Function Tests: ?Recent Labs  ?Lab 11/05/21 ?0510 11/06/21 ?0407  ?AST 10* 11*  ?ALT 8 9  ?ALKPHOS 39 40  ?BILITOT 0.6 0.6  ?PROT 4.8* 4.8*  ?ALBUMIN 2.5* 2.4*  ? ?No results for input(s): LIPASE, AMYLASE in the last 168 hours. ?No results for input(s): AMMONIA in the last 168 hours. ?Coagulation Profile: ?No results for input(s): INR, PROTIME in the last 168 hours. ?Cardiac Enzymes: ?No results for input(s): CKTOTAL, CKMB, CKMBINDEX, TROPONINI in the last 168 hours. ?BNP (last 3  results) ?No results for input(s): PROBNP in the last 8760 hours. ?HbA1C: ?No results for input(s): HGBA1C in the last 72 hours. ?CBG: ?No results for input(s): GLUCAP in the last 168 hours. ?Lipid Profile: ?No results for input(s): CHOL, HDL, LDLCALC, TRIG, CHOLHDL, LDLDIRECT in the last 72 hours. ?Thyroid Function Tests: ?No results for input(s): TSH, T4TOTAL, FREET4, T3FREE, THYROIDAB in the last 72 hours. ?Anemia Panel: ?No results for input(s): VITAMINB12, FOLATE, FERRITIN, TIBC, IRON, RETICCTPCT in the last 72 hours. ?Sepsis Labs: ?No results for input(s): PROCALCITON, LATICACIDVEN in the last 168 hours. ? ? ?No results found for this or any previous visit (from the past 240 hour(s)). ?  ? ? ? ? ? ?Radiology Studies: ?No results found. ? ? ? ? ? ?Scheduled Meds: ? acidophilus  2 capsule Oral Daily  ? feeding supplement  237 mL Oral TID BM  ? heparin injection (subcutaneous)  5,000 Units Subcutaneous Q8H  ? hydrALAZINE  50 mg Oral Q8H  ? multivitamin with minerals  1 tablet Oral Daily  ? sodium bicarbonate  650 mg Oral BID  ? vitamin B-12  500 mcg Oral Daily  ? ?Continuous Infusions: ? sodium chloride Stopped (11/07/21 0344)  ? sodium chloride Stopped (11/03/21 0438)  ? ? ?Assessment & Plan: ?  ?Principal Problem: ?  AKI (acute kidney injury) ([place]) ?Active Problems: ?  Major depression in remission [facility]) ?  GERD (gastroesophageal reflux disease) ?  HTN (hypertension) ?  Carotid stenosis, asymptomatic, minimal ?  Benign essential HTN ?  Acute kidney failure, unspecified ([place]) ?  Chronic kidney disease, stage 3 unspecified ([place]) ?  Monoclonal gammopathy ?  Enterotoxigenic Escherichia coli infection ?  Leukocytosis ?  Malnutrition of moderate degree ?  Colitis ? ? ?AKI (acute kidney injury) ([place]) ?- Presumed secondary to contrast nephropathy in setting of recent CT studies involving contrast ?Was on ivf , d/c'd due to edema  ?Avoid nephrotoxic meds ?Acidosis improved with bicarb we will continue ?Since giving  Lasix 20 mg x 1 monitor ?4/2 still stable after lasix. Told family and pt will hold on giving more diuretics since going for angiogram ?4/3 renal function stable.  Need to monitor after angiogram. ? ? ? ?  ?Leukocytosis ?-Likely secondary to above colitis ?4/3 still elevated.  Afebrile  ? ? ? ? ?  ?Enterotoxigenic Escherichia coli infection ?- GI and C. difficile PCR panel reviewed. CDiff is neg. Stool study is pos for ETEC ?-Given hx of recurrent colitis, GI was consulted at time of presentation ?-Continued on empiric zosyn ?-GI following. Pt underwent colonoscopy with findings of patchy bullous blebs and colitis, appearing ischemic. 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 Vascular surgery had recommended CT angio once renal function normalizes ?3/29 GI will f/u as outpatient.  I will sign off ?3/30 reached out to vascular today about plans since renal function improved if still moving forward to obtaining cta, awaiting response. ?Did explain to pt possibility if proceed with cta, may go into aki again, she verbalizes an understanding .(Explained this to her poa cousin too) ?/3 plan for angiogram this week consider CTA since it requires less contrast.  We will defer when this will be done to vascular surgery  ? ? ? ? ? ? ? ? ? ? ?LE Edema ?More chronic. Was on diuretics as outpt but not taking ?Also low albumin  which not helping with edema ?Will give lasix '20mg'$  po x1  (monitor bp, creatinine) ?4/2 is on torsemide at home. LE edema not much improved with it. Echo reviewed with her and family- diastolic dysfxn stage II ?4/3 Will need referral to [facility] cardiology on discharge ? ? ? ? ? ?Moderate Malnutrition  ?Due to social/environmental circumstances as evidence by moderate fat depletion, mild muscle depletion ?RD following-please see note ?4/2 p.o. intake better continue with protein supplementation  ? ? ?  ?  ? ? ?NAG  acidosis ?Possibly 2/2 AKI ?Improved  ?continue bicarb ?  ?Benign essential HTN ?- Cont hydralazine '50mg'$  q8h ?- Hydralazine 10 mg p.o. every 6 hours as needed for SBP greater than 175 ?  ? ? ? ?DVT prophylaxis: Heparin ?Code Statu

## 2021-11-11 NOTE — Progress Notes (Signed)
Physical Therapy Treatment ?Patient Details ?Name: Marilyn Sharp ?MRN: [id number] ?DOB: Sep 25, 1935 ?Today's Date: 11/11/2021 ? ? ?History of Present Illness [name] is an 86 year old female with with history of depression, anxiety, hyperlipidemia, hypertension, CKD 3B, GERD, MGUS, who presents emergency department for chief concerns of persistent colitis. ? ?  ?PT Comments  ? ? Pt is making limited progress towards goals. B LE look improved with less edema present, however still presents with decreased endurance. Still requires cues for sequencing of RW and gets too far away from RW during turns. Able to sit in recliner and perform there-ex. Will continue to progress as able.   ?Recommendations for follow up therapy are one component of a multi-disciplinary discharge planning process, led by the attending physician.  Recommendations may be updated based on patient status, additional functional criteria and insurance authorization. ? ?Follow Up Recommendations ? Skilled nursing-short term rehab (<3 hours/day) ?  ?  ?Assistance Recommended at Discharge Intermittent Supervision/Assistance  ?Patient can return home with the following A little help with walking and/or transfers;A little help with bathing/dressing/bathroom;Help with stairs or ramp for entrance ?  ?Equipment Recommendations ? Rolling walker (2 wheels)  ?  ?Recommendations for Other Services   ? ? ?  ?Precautions / Restrictions Precautions ?Precautions: Fall ?Restrictions ?Weight Bearing Restrictions: No  ?  ? ?Mobility ? Bed Mobility ?Overal bed mobility: Needs Assistance ?Bed Mobility: Supine to Sit ?  ?  ?Supine to sit: Min guard ?  ?  ?General bed mobility comments: able to initiate mobility with transfer from sup->sit however needed cga for B LE advancement ?  ? ?Transfers ?Overall transfer level: Needs assistance ?Equipment used: Rolling walker (2 wheels) ?Transfers: Sit to/from Stand ?Sit to Stand: Min assist ?  ?  ?  ?  ?  ?General transfer  comment: cues for hand placement. Once standing, upright posture noted ?  ? ?Ambulation/Gait ?Ambulation/Gait assistance: Min guard ?Gait Distance (Feet): 50 Feet ?Assistive device: Rolling walker (2 wheels) ?Gait Pattern/deviations: Step-through pattern ?  ?  ?  ?General Gait Details: cues for sequencing with increased stiffness noted this session along with shuffle gait pattern. Fatigues quickly ? ? ?Stairs ?  ?  ?  ?  ?  ? ? ?Wheelchair Mobility ?  ? ?Modified Rankin (Stroke Patients Only) ?  ? ? ?  ?Balance Overall balance assessment: Needs assistance ?Sitting-balance support: No upper extremity supported, Feet supported ?Sitting balance-Leahy Scale: Good ?  ?  ?Standing balance support: Bilateral upper extremity supported, During functional activity ?Standing balance-Leahy Scale: Fair ?  ?  ?  ?  ?  ?  ?  ?  ?  ?  ?  ?  ?  ? ?  ?Cognition Arousal/Alertness: Awake/alert ?Behavior During Therapy: [facility] for tasks assessed/performed ?Overall Cognitive Status: Within Functional Limits for tasks assessed ?  ?  ?  ?  ?  ?  ?  ?  ?  ?  ?  ?  ?  ?  ?  ?  ?  ?  ?  ? ?  ?Exercises Other Exercises ?Other Exercises: seated ther-ex performed on B LE including alt marching and LAQ. 10 reps with supervision. Also able to stand from chair multiple times to change out underwear and pad while using RW ? ?  ?General Comments   ?  ?  ? ?Pertinent Vitals/Pain Pain Assessment ?Pain Assessment: No/denies pain  ? ? ?Home Living   ?  ?  ?  ?  ?  ?  ?  ?  ?  ?   ?  ?  Prior Function    ?  ?  ?   ? ?PT Goals (current goals can now be found in the care plan section) Acute Rehab PT Goals ?Patient Stated Goal: to go home. ?PT Goal Formulation: With patient ?Time For Goal Achievement: 11/15/21 ?Potential to Achieve Goals: Good ?Progress towards PT goals: Progressing toward goals ? ?  ?Frequency ? ? ? Min 2X/week ? ? ? ?  ?PT Plan Current plan remains appropriate  ? ? ?Co-evaluation   ?  ?  ?  ?  ? ?  ?AM-PAC PT "6 Clicks" Mobility   ?Outcome  Measure ? Help needed turning from your back to your side while in a flat bed without using bedrails?: A Little ?Help needed moving from lying on your back to sitting on the side of a flat bed without using bedrails?: A Little ?Help needed moving to and from a bed to a chair (including a wheelchair)?: A Little ?Help needed standing up from a chair using your arms (e.g., wheelchair or bedside chair)?: A Little ?Help needed to walk in hospital room?: A Little ?Help needed climbing 3-5 steps with a railing? : A Little ?6 Click Score: 18 ? ?  ?End of Session Equipment Utilized During Treatment: Gait belt ?Activity Tolerance: Patient tolerated treatment well ?Patient left: in chair;with chair alarm set;with family/visitor present ?Nurse Communication: Mobility status ?PT Visit Diagnosis: Unsteadiness on feet (R26.81);Other abnormalities of gait and mobility (R26.89);Repeated falls (R29.6);Muscle weakness (generalized) (M62.81);History of falling (Z91.81);Difficulty in walking, not elsewhere classified (R26.2) ?  ? ? ?Time: 6433-2951 ?PT Time Calculation (min) (ACUTE ONLY): 25 min ? ?Charges:  $Gait Training: 8-22 mins ?$Therapeutic Exercise: 8-22 mins          ?          ? ?[name], PT, DPT, GCS ?[phone] ? ? ? ?[name] ?11/11/2021, 1:34 PM ? ?

## 2021-11-11 NOTE — Progress Notes (Signed)
 Vein and Vascular Surgery ? ?Daily Progress Note ? ? ?Subjective  -  ? ?Ate about half of her dinner.  Still has early satiety and some abdominal pain with meals.  No major events overnight. ? ?Objective ?Vitals:  ? 11/10/21 1926 11/11/21 0454 11/11/21 0845 11/11/21 1457  ?BP: 137/74 135/76 120/74 122/72  ?Pulse: 87 81 84 82  ?Resp: '18 18 16 16  '$ ?Temp: 98.5 ?F (36.9 ?C) 98.2 ?F (36.8 ?C) 98.6 ?F (37 ?C) 98.4 ?F (36.9 ?C)  ?TempSrc:   Oral Oral  ?SpO2: 95% 94% 93% 93%  ?Weight:      ?Height:      ? ? ?Intake/Output Summary (Last 24 hours) at 11/11/2021 1930 ?Last data filed at 11/11/2021 1010 ?Gross per 24 hour  ?Intake 360 ml  ?Output 350 ml  ?Net 10 ml  ? ? ?PULM  CTAB ?CV  RRR ? ? ?Laboratory ?CBC ?   ?Component Value Date/Time  ? WBC 17.3 (H) 11/10/2021 5465  ? HGB 12.4 11/10/2021 0638  ? HCT 39.5 11/10/2021 0638  ? PLT 584 (H) 11/10/2021 0354  ? ? ?BMET ?   ?Component Value Date/Time  ? NA 140 11/10/2021 0638  ? K 4.2 11/10/2021 0638  ? CL 110 11/10/2021 0638  ? CO2 23 11/10/2021 0638  ? GLUCOSE 89 11/10/2021 0638  ? BUN 30 (H) 11/10/2021 6568  ? CREATININE 1.06 (H) 11/10/2021 1275  ? CREATININE 0.79 02/16/2015 1516  ? CALCIUM 8.8 (L) 11/10/2021 1700  ? GFRNONAA 52 (L) 11/10/2021 1749  ? GFRAA 39 (L) 01/05/2020 1442  ? ? ?Assessment/Planning: ? ? 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  For mesenteric angiogram tomorrow.  Have discussed with the patient and her power of attorney the situation.  I am not entirely sure that intervention on the celiac stenosis is going to improve her symptoms, but that is certainly possible.  I discussed that it is entirely possible that this is completely unrelated to her symptoms and intervention may not provide any benefit.  They both voiced their understanding and still desired to proceed which I do think is reasonable ?Leg swelling.  Had acute renal failure and has protein calorie malnutrition and other medical issues which is likely the cause.  Has likely  developed lymphedema as well.  Compression and elevation would be the hallmarks of treatment and diuresis as the primary team sees fit ? ? ?Leotis Pain ? ?11/11/2021, 7:30 PM ? ? ? ?  ?

## 2021-11-12 ENCOUNTER — Encounter
Admission: EM | Disposition: A | Discharge status: Skilled Nursing Facility | Payer: Self-pay | Source: Home / Self Care | Attending: Internal Medicine

## 2021-11-12 ENCOUNTER — Encounter: Payer: Self-pay | Admitting: Vascular Surgery

## 2021-11-12 DIAGNOSIS — N179 Acute kidney failure, unspecified: Secondary | ICD-10-CM | POA: Diagnosis not present

## 2021-11-12 DIAGNOSIS — K551 Chronic vascular disorders of intestine: Secondary | ICD-10-CM

## 2021-11-12 HISTORY — PX: VISCERAL ARTERY INTERVENTION: CATH118277

## 2021-11-12 SURGERY — VISCERAL ARTERY INTERVENTION
Anesthesia: Moderate Sedation

## 2021-11-12 MED ORDER — ASPIRIN EC 81 MG PO TBEC
81.0000 mg | DELAYED_RELEASE_TABLET | Freq: Every day | ORAL | Status: DC
  Administered 2021-11-12 – 2021-11-14 (×3): 81 mg via ORAL
  Filled 2021-11-12 (×3): qty 1

## 2021-11-12 MED ORDER — CEFAZOLIN SODIUM-DEXTROSE 1-4 GM/50ML-% IV SOLN
INTRAVENOUS | Status: DC | PRN
  Administered 2021-11-12: 2 g via INTRAVENOUS

## 2021-11-12 MED ORDER — IODIXANOL 320 MG/ML IV SOLN
INTRAVENOUS | Status: DC | PRN
  Administered 2021-11-12: 35 mL via INTRA_ARTERIAL

## 2021-11-12 MED ORDER — FENTANYL CITRATE (PF) 100 MCG/2ML IJ SOLN
INTRAMUSCULAR | Status: DC | PRN
Start: 2021-11-12 — End: 2021-11-12
  Administered 2021-11-12: 50 ug via INTRAVENOUS

## 2021-11-12 MED ORDER — MIDAZOLAM HCL 2 MG/2ML IJ SOLN
INTRAMUSCULAR | Status: DC | PRN
  Administered 2021-11-12: 1 mg via INTRAVENOUS

## 2021-11-12 MED ORDER — SODIUM CHLORIDE 0.9 % IV SOLN: INTRAVENOUS | Status: DC

## 2021-11-12 MED ORDER — HEPARIN SODIUM (PORCINE) 1000 UNIT/ML IJ SOLN
INTRAMUSCULAR | Status: DC | PRN
Start: 2021-11-12 — End: 2021-11-12
  Administered 2021-11-12: 4000 [IU] via INTRAVENOUS

## 2021-11-12 MED ORDER — MIDAZOLAM HCL 5 MG/5ML IJ SOLN
INTRAMUSCULAR | Status: AC
Start: 2021-11-12 — End: 2021-11-12
  Filled 2021-11-12: qty 5

## 2021-11-12 MED ORDER — CEFAZOLIN SODIUM-DEXTROSE 2-4 GM/100ML-% IV SOLN
INTRAVENOUS | Status: AC
  Filled 2021-11-12: qty 100

## 2021-11-12 MED ORDER — ATORVASTATIN CALCIUM 10 MG PO TABS
10.0000 mg | ORAL_TABLET | Freq: Every day | ORAL | Status: DC
Start: 2021-11-12 — End: 2021-11-14
  Administered 2021-11-12 – 2021-11-14 (×3): 10 mg via ORAL
  Filled 2021-11-12 (×3): qty 1

## 2021-11-12 MED ORDER — CLOPIDOGREL BISULFATE 75 MG PO TABS
75.0000 mg | ORAL_TABLET | Freq: Every day | ORAL | Status: DC
Start: 2021-11-12 — End: 2021-11-14
  Administered 2021-11-12 – 2021-11-14 (×3): 75 mg via ORAL
  Filled 2021-11-12 (×3): qty 1

## 2021-11-12 MED ORDER — HEPARIN SODIUM (PORCINE) 1000 UNIT/ML IJ SOLN
INTRAMUSCULAR | Status: AC
  Filled 2021-11-12: qty 10

## 2021-11-12 MED ORDER — FENTANYL CITRATE (PF) 100 MCG/2ML IJ SOLN
INTRAMUSCULAR | Status: AC
  Filled 2021-11-12: qty 2

## 2021-11-12 MED ORDER — CEFAZOLIN SODIUM-DEXTROSE 2-4 GM/100ML-% IV SOLN
2.0000 g | INTRAVENOUS | Status: DC
  Filled 2021-11-12: qty 100

## 2021-11-12 SURGICAL SUPPLY — 15 items
CATH ANGIO 5F PIGTAIL 65CM (CATHETERS) ×1 IMPLANT
CATH VS15FR (CATHETERS) ×1 IMPLANT
COVER PROBE U/S 5X48 (MISCELLANEOUS) ×1 IMPLANT
DEVICE STARCLOSE SE CLOSURE (Vascular Products) ×1 IMPLANT
DEVICE TORQUE (MISCELLANEOUS) ×1 IMPLANT
GLIDEWIRE ADV .035X180CM (WIRE) ×1 IMPLANT
GLIDEWIRE STIFF .35X180X3 HYDR (WIRE) ×1 IMPLANT
KIT ENCORE 26 ADVANTAGE (KITS) ×1 IMPLANT
PACK ANGIOGRAPHY (CUSTOM PROCEDURE TRAY) ×1 IMPLANT
SHEATH ANL2 6FRX45 HC (SHEATH) ×1 IMPLANT
SHEATH BRITE TIP 5FRX11 (SHEATH) ×1 IMPLANT
STENT LIFESTREAM 7X16X80 (Permanent Stent) ×1 IMPLANT
SYR MEDRAD MARK 7 150ML (SYRINGE) ×1 IMPLANT
TUBING CONTRAST HIGH PRESS 72 (TUBING) ×1 IMPLANT
WIRE GUIDERIGHT .035X150 (WIRE) ×1 IMPLANT

## 2021-11-12 NOTE — Progress Notes (Signed)
?PROGRESS NOTE ? ? ? ?Marilyn Sharp  MHD:[id number] DOB: [birth year] DOA: 10/30/2021 ?PCP: Idelle Crouch, MD  ? ? ?Brief Narrative:  ?[name] is an 86 year old female with with history of depression, anxiety, hyperlipidemia, hypertension, CKD 3B, GERD, MGUS, who presents emergency department for chief concerns of persistent colitis. ?Was admitted to the hospital.  Was found with E. coli infection.  Started on empiric Zosyn.  GI and vascular surgery were consulted.  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  She was treated for her colitis.  She underwent angiogram on 4/3 with stent placement by vascular.  Prior to angiogram she had contrast nephropathy and was treated for that.  Renal function was at baseline.Also c/o LE edema, was given one time dose lasix after AKI improved.  ? ?4/4 s/p angiogram today by vascular surgery.  ? ? ? ? ? ?Consultants:  ?GI, vascular ? ?Procedures:  ? ?Antimicrobials:  ?Zosyn ? ? ?Subjective: ?No cp, no sob, no abd pain.  ? ? ?Objective: ?Vitals:  ? 11/11/21 1457 11/11/21 1953 11/12/21 0520 11/12/21 2119  ?BP: 122/72 134/67 124/65 134/68  ?Pulse: 82 86 81 87  ?Resp: '16 18 18 17  '$ ?Temp: 98.4 ?F (36.9 ?C) 98.3 ?F (36.8 ?C) 97.8 ?F (36.6 ?C) 98.1 ?F (36.7 ?C)  ?TempSrc: Oral     ?SpO2: 93% 96% 94% 93%  ?Weight:      ?Height:      ? ? ?Intake/Output Summary (Last 24 hours) at 11/12/2021 0843 ?Last data filed at 11/11/2021 1010 ?Gross per 24 hour  ?Intake 360 ml  ?Output --  ?Net 360 ml  ? ?Filed Weights  ? 11/01/21 2200  ?Weight: 68.3 kg  ? ? ?Examination: ?Calm, NAD ?Cta no w/r ?Reg s1/s2 no gallop ?Soft benign +bs ?+LE edema b/l ?Aaoxox3  ?Mood and affect appropriate in current setting  ? ? ? ?Data Reviewed: I have personally reviewed following labs and imaging studies ? ?CBC: ?Recent Labs  ?Lab 11/06/21 ?0407 11/08/21 ?1028 11/09/21 ?4174 11/10/21 ?0814  ?WBC 18.4* 18.5* 16.2* 17.3*  ?HGB 11.8* 12.9 11.7* 12.4  ?HCT 37.4 40.8 37.2 39.5  ?MCV 90.8 91.1 89.6 90.2  ?PLT 471* 600*  523* 584*  ? ?Basic Metabolic Panel: ?Recent Labs  ?Lab 11/06/21 ?0407 11/07/21 ?4818 11/09/21 ?5631 11/10/21 ?4970  ?NA 141  --  140 140  ?K 3.8  --  4.2 4.2  ?CL 115*  --  112* 110  ?CO2 20*  --  23 23  ?GLUCOSE 94  --  91 89  ?BUN 21  --  30* 30*  ?CREATININE 1.24* 1.18* 1.01* 1.06*  ?CALCIUM 7.9*  --  8.4* 8.8*  ?MG  --   --   --  1.9  ? ?GFR: ?Estimated Creatinine Clearance: 36.2 mL/min (A) (by C-G formula based on SCr of 1.06 mg/dL (H)). ?Liver Function Tests: ?Recent Labs  ?Lab 11/06/21 ?0407  ?AST 11*  ?ALT 9  ?ALKPHOS 40  ?BILITOT 0.6  ?PROT 4.8*  ?ALBUMIN 2.4*  ? ?No results for input(s): LIPASE, AMYLASE in the last 168 hours. ?No results for input(s): AMMONIA in the last 168 hours. ?Coagulation Profile: ?No results for input(s): INR, PROTIME in the last 168 hours. ?Cardiac Enzymes: ?No results for input(s): CKTOTAL, CKMB, CKMBINDEX, TROPONINI in the last 168 hours. ?BNP (last 3 results) ?No results for input(s): PROBNP in the last 8760 hours. ?HbA1C: ?No results for input(s): HGBA1C in the last 72 hours. ?CBG: ?No results for input(s): GLUCAP  in the last 168 hours. ?Lipid Profile: ?No results for input(s): CHOL, HDL, LDLCALC, TRIG, CHOLHDL, LDLDIRECT in the last 72 hours. ?Thyroid Function Tests: ?No results for input(s): TSH, T4TOTAL, FREET4, T3FREE, THYROIDAB in the last 72 hours. ?Anemia Panel: ?No results for input(s): VITAMINB12, FOLATE, FERRITIN, TIBC, IRON, RETICCTPCT in the last 72 hours. ?Sepsis Labs: ?No results for input(s): PROCALCITON, LATICACIDVEN in the last 168 hours. ? ? ?No results found for this or any previous visit (from the past 240 hour(s)). ?  ? ? ? ? ? ?Radiology Studies: ?No results found. ? ? ? ? ? ?Scheduled Meds: ? acidophilus  2 capsule Oral Daily  ? feeding supplement  237 mL Oral TID BM  ? heparin injection (subcutaneous)  5,000 Units Subcutaneous Q8H  ? hydrALAZINE  50 mg Oral Q8H  ? multivitamin with minerals  1 tablet Oral Daily  ? sodium bicarbonate  650 mg Oral BID  ?  vitamin B-12  500 mcg Oral Daily  ? ?Continuous Infusions: ? sodium chloride Stopped (11/07/21 0344)  ? sodium chloride Stopped (11/03/21 0438)  ? ? ?Assessment & Plan: ?  ?Principal Problem: ?  AKI (acute kidney injury) ([place]) ?Active Problems: ?  Major depression in remission [facility]) ?  GERD (gastroesophageal reflux disease) ?  HTN (hypertension) ?  Carotid stenosis, asymptomatic, minimal ?  Benign essential HTN ?  Acute kidney failure, unspecified ([place]) ?  Chronic kidney disease, stage 3 unspecified ([place]) ?  Monoclonal gammopathy ?  Enterotoxigenic Escherichia coli infection ?  Leukocytosis ?  Malnutrition of moderate degree ?  Colitis ? ? ?AKI (acute kidney injury) ([place]) ?- Presumed secondary to contrast nephropathy in setting of recent CT studies involving contrast ?Was on ivf , d/c'd due to edema  ?Avoid nephrotoxic meds ?Acidosis improved with bicarb we will continue ?Since giving Lasix 20 mg x 1 monitor ?4/2 still stable after lasix. Told family and pt will hold on giving more diuretics since going for angiogram ?4/3 renal function stable.  Need to monitor after angiogram. ?4/4 ck am labs since post angiogram. ? ? ? ?  ?Leukocytosis ?-Likely secondary to above colitis ?4/4 still elevated. Afebrile.  ? ? ? ? ?  ?Enterotoxigenic Escherichia coli infection ?- GI and C. difficile PCR panel reviewed. CDiff is neg. Stool study is pos for ETEC ?-Given hx of recurrent colitis, GI was consulted at time of presentation ?-Continued on empiric zosyn ?-GI following. Pt underwent colonoscopy with findings of patchy bullous blebs and colitis, appearing ischemic. 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 Vascular surgery had recommended CT angio once renal function normalizes ?3/29 GI will f/u as outpatient.  I will sign off ?3/30 reached out to vascular today about plans since renal function improved if still moving forward to obtaining cta,  awaiting response. ?Did explain to pt possibility if proceed with cta, may go into aki again, she verbalizes an understanding .(Explained this to her poa cousin too) ?4/4 s/p angiogram today, s/p stent to celiac artery -further mx per vascular ? ? ? ? ? ?LE Edema ?More chronic. Was on diuretics as outpt but not taking ?Also low albumin  which not helping with edema ?Given  lasix '20mg'$  po x1  after AKI at baseline. ?Echo reviewed with her and family- diastolic dysfxn stage II ?4/4 Will need to resume torsemide on discharge and referral should be made to [facility] cardiology on discharge  ? ? ? ? ? ? ?  Moderate Malnutrition  ?Due to social/environmental circumstances as evidence by moderate fat depletion, mild muscle depletion ?RD following-please see note ?/4 continue protein supplementation and encouraged to continue on her p.o. intake  ? ? ? ? ? ?NAG acidosis ?Possibly 2/2 AKI ?Improved  ?continue bicarb ?  ?Benign essential HTN ?- Cont hydralazine '50mg'$  q8h ?- Hydralazine 10 mg p.o. every 6 hours as needed for SBP greater than 175 ?  ? ? ? ?DVT prophylaxis: Heparin ?Code Status: Full ?Family Communication: None at bedside ?Disposition Plan:  ?Status is: Inpatient ?Remains inpatient appropriate because: Angiogram today need to monitor renal function post angiogram prior to discharge  ? ? ?  ? ? ? ? ? LOS: 12 days  ? ?Time spent: 35 minutes with more than 50% on coordination of care ? ? ? ?Nolberto Hanlon, MD ?[facility] ?Pager [phone] ? ?If 7PM-7AM, please contact night-coverage ?11/12/2021, 8:43 AM   ?

## 2021-11-12 NOTE — Op Note (Signed)
[facility] ? Percutaneous Study/Intervention Procedural Note ? ? ?Date: 11/12/2021 ? ?Surgeon(s): Leotis Pain, MD ? ?Assistants: none ? ?Pre-operative Diagnosis: 1.  Chronic mesenteric ischemia ?2.  Celiac artery stenosis ? ? ?Post-operative diagnosis:  Same ? ?Procedure(s) Performed: ?            1.  Ultrasound guidance for vascular access right femoral artery  ?            2.  Catheter placement into celiac artery from right femoral approach ?            3.  Aortogram and selective angiogram of the celiac artery ?            4.  Stent to the celiac artery with 7 mm diameter x 16 mm length balloon expandable stent ?            5.  StarClose closure device right femoral artery ? ?Contrast: 35 ? ?Fluoro time: 3.7 ? ?EBL: 5 cc ? ?Anesthesia: Approximately 27 minutes of Moderate conscious sedation using 1 mg of Versed and 50 mcg of Fentanyl ?             ?Indications:  Patient is a 86 y.o. female who has symptoms consistent with mesenteric ischemia. The patient has a duplex showing celiac artery stenosis. The patient is brought in for angiography for further evaluation and potential treatment. Risks and benefits are discussed and informed consent is obtained ? ?Procedure:  The patient was identified and appropriate procedural time out was performed.  The patient was then placed supine on the table and prepped and draped in the usual sterile fashion. Moderate conscious sedation was administered during a face to face encounter with the patient throughout the procedure with my supervision of the RN administering medicines and monitoring the patient's vital signs, pulse oximetry, telemetry and mental status throughout from the start of the procedure until the patient was taken to the recovery room. Ultrasound was used to evaluate the right common femoral artery.  It was patent .  A digital ultrasound image was acquired.  A Seldinger needle was used to access the right common femoral artery under  direct ultrasound guidance and a permanent image was performed.  A 0.035 J wire was advanced without resistance and a 5Fr sheath was placed.  Pigtail catheter was placed into the aorta and an AP aortogram was performed. This demonstrated a relatively large IMA which was patent, renal arteries which were patent, and flow in the distal celiac and SMA on the branches but the origins could not be seen in the AP projection. We transitioned to the lateral projection to image the celiac and SMA. The lateral image demonstrated the SMA to be widely patent.  The celiac artery had a greater than 70% stenosis on the aortogram with what appeared to be the patient was given 4 units of IV heparin. We upsized to a 6 Fr sheath.  A V S1 catheter was used to selectively cannulate the celiac artery.  This demonstrated a greater than 75% stenosis of the celiac artery. Based on her symptoms and these findings, I elected to treat the celiac artery to try to improve the patient's clinical course. I crossed the lesion without difficulty with an advantage wire and the Ansell sheath. I then used a 7 mm diameter x 16 mm length balloon expandable stent to perform treatment of the celiac artery. I inflated the balloon to 10 atm. On completion angiogram following this, 10%  residual stenosis was identified. At this point, I elected to terminate the procedure. The diagnostic catheter was removed. StarClose closure device was deployed in usual fashion with excellent hemostatic result. The patient was taken to the recovery room in stable condition having tolerated the procedure well. ? ? ? ? ?Findings: Patent SMA and IMA seen on the angiogram images.  Celiac artery stenosis of greater than 75% successfully treated with stent placement ? ?Disposition: Patient was taken to the recovery room in stable condition having tolerated the procedure well. ? ?Complications:  None ? ?Leotis Pain ?11/12/2021 ?12:14 PM ? ? ?This note was created with Dragon Medical  transcription system. Any errors in dictation are purely unintentional.  ?

## 2021-11-12 NOTE — Progress Notes (Signed)
PT Cancellation Note ? ?Patient Details ?Name: Deanne Bedgood Pettet ?MRN: [id number] ?DOB: 12/19/1935 ? ? ?Cancelled Treatment:    Reason Eval/Treat Not Completed: Other (comment). Currently out of room for procedure. Will re-attempt next available date. ? ? ?Adley Mazurowski ?11/12/2021, 10:57 AM ?[name], PT, DPT, GCS ?[phone] ? ?

## 2021-11-12 NOTE — Progress Notes (Signed)
Mobility Specialist - Progress Note ? ? 11/12/21 1333  ?Mobility  ?Activity Contraindicated/medical hold  ? ? ? ?New bed rest orders in place. Will attempt session when medically appropriate.  ? ? ?Kathee Delton ?Mobility Specialist ?11/12/21, 1:34 PM ? ? ? ? ?

## 2021-11-12 NOTE — Progress Notes (Signed)
NPO for angiogram today; Dr. Lucky Cowboy consulted for am hydralazine;Acknowledged, ok to give. Given with sip of water along with one Tylenol for h/a. NPO for angiogram later today. Barbaraann Faster, RN 6:13 AM 11/12/2021 ? ?

## 2021-11-13 DIAGNOSIS — I771 Stricture of artery: Secondary | ICD-10-CM | POA: Diagnosis not present

## 2021-11-13 DIAGNOSIS — E440 Moderate protein-calorie malnutrition: Secondary | ICD-10-CM

## 2021-11-13 DIAGNOSIS — I5032 Chronic diastolic (congestive) heart failure: Secondary | ICD-10-CM | POA: Diagnosis present

## 2021-11-13 DIAGNOSIS — N179 Acute kidney failure, unspecified: Secondary | ICD-10-CM | POA: Diagnosis not present

## 2021-11-13 DIAGNOSIS — R609 Edema, unspecified: Secondary | ICD-10-CM

## 2021-11-13 DIAGNOSIS — R600 Localized edema: Secondary | ICD-10-CM | POA: Diagnosis present

## 2021-11-13 LAB — RENAL FUNCTION PANEL
Albumin: 2.3 g/dL — ABNORMAL LOW (ref 3.5–5.0)
Anion gap: 4 — ABNORMAL LOW (ref 5–15)
BUN: 37 mg/dL — ABNORMAL HIGH (ref 8–23)
CO2: 26 mmol/L (ref 22–32)
Calcium: 8.8 mg/dL — ABNORMAL LOW (ref 8.9–10.3)
Chloride: 107 mmol/L (ref 98–111)
Creatinine, Ser: 1.06 mg/dL — ABNORMAL HIGH (ref 0.44–1.00)
GFR, Estimated: 52 mL/min — ABNORMAL LOW (ref >60)
Glucose, Bld: 91 mg/dL (ref 70–99)
Phosphorus: 3.5 mg/dL (ref 2.5–4.6)
Potassium: 4.5 mmol/L (ref 3.5–5.1)
Sodium: 137 mmol/L (ref 135–145)

## 2021-11-13 LAB — CBC
HCT: 36.5 % (ref 36.0–46.0)
Hemoglobin: 11.7 g/dL — ABNORMAL LOW (ref 12.0–15.0)
MCH: 28.8 pg (ref 26.0–34.0)
MCHC: 32.1 g/dL (ref 30.0–36.0)
MCV: 89.9 fL (ref 80.0–100.0)
Platelets: 518 10*3/uL — ABNORMAL HIGH (ref 150–400)
RBC: 4.06 MIL/uL (ref 3.87–5.11)
RDW: 17.6 % — ABNORMAL HIGH (ref 11.5–15.5)
WBC: 18 10*3/uL — ABNORMAL HIGH (ref 4.0–10.5)
nRBC: 0 % (ref 0.0–0.2)

## 2021-11-13 LAB — BRAIN NATRIURETIC PEPTIDE: B Natriuretic Peptide: 145.8 pg/mL — ABNORMAL HIGH (ref 0.0–100.0)

## 2021-11-13 NOTE — Assessment & Plan Note (Signed)
Nutrition Status: ?Nutrition Problem: Moderate Malnutrition ?Etiology: social / environmental circumstances (advanced age, poor oral intake) ?Signs/Symptoms: moderate fat depletion, moderate muscle depletion ?  ?Seen by nutrition.  Started on Ensure 3 times daily plus multivitamin. ? ? ?

## 2021-11-13 NOTE — Assessment & Plan Note (Signed)
Continue PPI ?

## 2021-11-13 NOTE — Progress Notes (Signed)
Physical Therapy Treatment ?Patient Details ?Name: Marilyn Sharp ?MRN: [id number] ?DOB: Oct 17, 1935 ?Today's Date: 11/13/2021 ? ? ?History of Present Illness [name] is an 86 year old female with with history of depression, anxiety, hyperlipidemia, hypertension, CKD 3B, GERD, MGUS, who presents emergency department for chief concerns of persistent colitis. ? ?  ?PT Comments  ? ? Pt is making limited progress towards goals and reports increased fatigue this date. Angio yesterday with stent placement. Pt reports she still feels her B LE are more swollen than usual, SCD and elevation performed at end of session. Slow gait speed this date and decreased endurance. Will continue to progress as able.  ?Recommendations for follow up therapy are one component of a multi-disciplinary discharge planning process, led by the attending physician.  Recommendations may be updated based on patient status, additional functional criteria and insurance authorization. ? ?Follow Up Recommendations ? Skilled nursing-short term rehab (<3 hours/day) ?  ?  ?Assistance Recommended at Discharge Intermittent Supervision/Assistance  ?Patient can return home with the following A little help with walking and/or transfers;A little help with bathing/dressing/bathroom;Help with stairs or ramp for entrance ?  ?Equipment Recommendations ? Rolling walker (2 wheels)  ?  ?Recommendations for Other Services   ? ? ?  ?Precautions / Restrictions Precautions ?Precautions: Fall ?Restrictions ?Weight Bearing Restrictions: No  ?  ? ?Mobility ? Bed Mobility ?  ?  ?  ?  ?  ?  ?  ?General bed mobility comments: not performed as received in chair ?  ? ?Transfers ?Overall transfer level: Needs assistance ?Equipment used: Rolling walker (2 wheels) ?Transfers: Sit to/from Stand ?Sit to Stand: Min assist ?  ?  ?  ?  ?  ?General transfer comment: needs slightly more assist to stand this date, reports more fatigue. Cues for hand placement ?   ? ?Ambulation/Gait ?Ambulation/Gait assistance: Min guard ?Gait Distance (Feet): 40 Feet ?Assistive device: Rolling walker (2 wheels) ?Gait Pattern/deviations: Step-through pattern ?  ?  ?  ?General Gait Details: 1 seated rest break 1/2 way through ambulation secondary to fatigue. RW used with cues for keeping close to RW ? ? ?Stairs ?  ?  ?  ?  ?  ? ? ?Wheelchair Mobility ?  ? ?Modified Rankin (Stroke Patients Only) ?  ? ? ?  ?Balance Overall balance assessment: Needs assistance ?Sitting-balance support: No upper extremity supported, Feet supported ?Sitting balance-Leahy Scale: Good ?  ?  ?Standing balance support: Bilateral upper extremity supported, During functional activity ?Standing balance-Leahy Scale: Fair ?  ?  ?  ?  ?  ?  ?  ?  ?  ?  ?  ?  ?  ? ?  ?Cognition Arousal/Alertness: Awake/alert ?Behavior During Therapy: [facility] for tasks assessed/performed ?Overall Cognitive Status: Within Functional Limits for tasks assessed ?  ?  ?  ?  ?  ?  ?  ?  ?  ?  ?  ?  ?  ?  ?  ?  ?General Comments: pleasant and agreeable to therapy ?  ?  ? ?  ?Exercises   ? ?  ?General Comments   ?  ?  ? ?Pertinent Vitals/Pain Pain Assessment ?Pain Assessment: No/denies pain  ? ? ?Home Living   ?  ?  ?  ?  ?  ?  ?  ?  ?  ?   ?  ?Prior Function    ?  ?  ?   ? ?PT Goals (current goals can now be found  in the care plan section) Acute Rehab PT Goals ?Patient Stated Goal: to go home. ?PT Goal Formulation: With patient ?Time For Goal Achievement: 11/15/21 ?Potential to Achieve Goals: Good ?Progress towards PT goals: Progressing toward goals ? ?  ?Frequency ? ? ? Min 2X/week ? ? ? ?  ?PT Plan Current plan remains appropriate  ? ? ?Co-evaluation   ?  ?  ?  ?  ? ?  ?AM-PAC PT "6 Clicks" Mobility   ?Outcome Measure ? Help needed turning from your back to your side while in a flat bed without using bedrails?: A Little ?Help needed moving from lying on your back to sitting on the side of a flat bed without using bedrails?: A Little ?Help needed  moving to and from a bed to a chair (including a wheelchair)?: A Little ?Help needed standing up from a chair using your arms (e.g., wheelchair or bedside chair)?: A Little ?Help needed to walk in hospital room?: A Little ?Help needed climbing 3-5 steps with a railing? : A Little ?6 Click Score: 18 ? ?  ?End of Session Equipment Utilized During Treatment: Gait belt ?Activity Tolerance: Patient tolerated treatment well ?Patient left: in chair;with chair alarm set;with SCD's reapplied ?Nurse Communication: Mobility status ?PT Visit Diagnosis: Unsteadiness on feet (R26.81);Other abnormalities of gait and mobility (R26.89);Repeated falls (R29.6);Muscle weakness (generalized) (M62.81);History of falling (Z91.81);Difficulty in walking, not elsewhere classified (R26.2) ?  ? ? ?Time: 8786-7672 ?PT Time Calculation (min) (ACUTE ONLY): 26 min ? ?Charges:  $Gait Training: 23-37 mins          ?          ? ?[name], PT, DPT, GCS ?[phone] ? ? ? ?[name] ?11/13/2021, 2:55 PM ? ?

## 2021-11-13 NOTE — Assessment & Plan Note (Signed)
Stable.  Not on any home medications. ?

## 2021-11-13 NOTE — TOC Progression Note (Signed)
Transition of Care (TOC) - Progression Note  ? ? ?Patient Details  ?Name: Marilyn Sharp ?MRN: [id number] ?Date of Birth: 11-01-1935 ? ?Transition of Care (TOC) CM/SW Contact  ?Beverly Sessions, RN ?Phone Number: ?11/13/2021, 3:01 PM ? ?Clinical Narrative:    ?Per MD anticipated DC tomorrow  ?Seth Bake at [facility] updated  ? ? ?Expected Discharge Plan: Home/Self Care ?Barriers to Discharge: Continued Medical Work up ? ?Expected Discharge Plan and Services ?Expected Discharge Plan: Home/Self Care ?  ?  ?  ?Living arrangements for the past 2 months: [place] ?                ?  ?  ?  ?  ?  ?  ?  ?  ?  ?  ? ? ?Social Determinants of Health (SDOH) Interventions ?  ? ?Readmission Risk Interventions ?   ? View : No data to display.  ?  ?  ?  ? ? ?

## 2021-11-13 NOTE — Progress Notes (Signed)
Mobility Specialist - Progress Note ? ? 11/13/21 1100  ?Mobility  ?Activity Ambulated with assistance in room  ?Range of Motion/Exercises Right leg;Left leg  ?Level of Assistance Standby assist, set-up cues, supervision of patient - no hands on  ?Assistive Device Front wheel walker  ?Distance Ambulated (ft) 20 ft  ?Activity Response Tolerated well  ?$Mobility charge 1 Mobility  ? ? ? ?During mobility: 84 HR, 93% SpO2 ? ? ?Pt sitting in recliner upon arrival, utilizing RA. MinA to stand. Pt ambulated in room with c/o feeling "woozy" later specifying that wooziness was more-so LE weakness. Slow, shuffled gait. Extensive seated rest break taken. Mild SOB. Pt performed LE therex and was returned to recliner with needs in reach.  ? ? ?Marilyn Sharp ?Mobility Specialist ?11/13/21, 11:54 AM ? ? ?

## 2021-11-13 NOTE — Progress Notes (Signed)
[facility] Progress Note ? ?Patient: Marilyn Sharp    WUJ:[id number]  DOA: 10/30/2021    ?Date of Service: the patient was seen and examined on 11/13/2021 ? ?Brief hospital course: ?86 year old female with past medical history of depression, anxiety, hypertension, stage IIIb chronic kidney disease and MGUS who presented to the emergency room on 3/22 with complaints of persistent colitis.  Patient has been having diarrhea on and off since September 2022 and was evaluated by GI and a CT of abdomen pelvis was done which noted colitis.  Patient given antibiotics which helped with diarrhea and abdominal pain, but then would continue intermittently.  Started having more abdominal pain as well as bright red blood per rectum and directed to the emergency room.  In the emergency room, patient found to have acute kidney injury and signs of colitis in the descending and sigmoid colon.  Patient admitted to the hospitalist service and GI consulted. ? ?Patient started on Zosyn.  Patient's stool study positive for enterotoxigenic E. coli infection.  Patient underwent colonoscopy with findings of patchy bullous and blebs and colitis which appeared to be ischemic.  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  Patient underwent angiogram on 4/4 with stent placement for celiac artery stenosis. ? ?Assessment and Plan: ?Assessment and Plan: ?* Celiac artery stenosis ([place]) ?Found to have greater than 70% stenosis.  Seen by vascular surgery status post angiogram and stent placement. ? ?Enterotoxigenic Escherichia coli infection ?- GI and C. difficile PCR panel reviewed. CDiff is neg. Stool study is pos for ETEC.  Patient was on Zosyn.  Completed full course. ? ?Peripheral edema ?Secondary to third spacing.  Patient states have been going on mildly prior to hospitalization and worse following hospitalization.  With poor p.o. intake/moderate protein calorie malnutrition,  patient third spacing, even more so following IV fluids in the hospital.  This is not felt to be secondary to heart failure.  Would not treat with Lasix.  Would improve with improved nutrition.  Explained to patient and she understands. ? ?Acute kidney failure, unspecified ([place]) ?Renal ultrasound notes no hydronephrosis.  Patient with history of stage IIIa chronic kidney disease.  Initially present with creatinine 2.66 and GFR 17.  With fluids and resumption of p.o., creatinine looks be at baseline, currently 1.06 with GFR 52 ? ?Chronic diastolic CHF (congestive heart failure) ([place]) ?When patient was complaining of lower extremity edema, echocardiogram checked and patient found to have grade 2 diastolic dysfunction.  Appears to be euvolemic.  Will check BNP.  Outpatient cardiology follow-up. ? ?HTN (hypertension) ?Continued on hydralazine at '50mg'$  q8h plus as needed hydralazine.  Blood pressure remaining stable. ? ?Malnutrition of moderate degree ?Nutrition Status: ?Nutrition Problem: Moderate Malnutrition ?Etiology: social / environmental circumstances (advanced age, poor oral intake) ?Signs/Symptoms: moderate fat depletion, moderate muscle depletion ?  ?Seen by nutrition.  Started on Ensure 3 times daily plus multivitamin. ? ? ? ?GERD (gastroesophageal reflux disease) ?Continue PPI ? ?Major depression in remission [facility]) ?Stable.  Not on any home medications. ? ?Leukocytosis ?-Likely secondary to above colitis ?-WBCstable at 17.1 ?- CBC in the a.m. ? ?Benign essential HTN ?- Cont hydralazine '50mg'$  q8h ?- Hydralazine 10 mg p.o. every 6 hours as needed for SBP greater than 175 ? ? ? ? ? ? ?Body mass index is 25.06 kg/m?Marland Kitchen  ?Nutrition Problem: Moderate Malnutrition ?Etiology: social / environmental circumstances (advanced age, poor oral intake) ?   ? ?Consultants: ?Gastroenterology ?Vascular surgery ? ?  Procedures: ?Angiogram status post celiac artery stent placement ?Colonoscopy ? ?Antimicrobials: ?IV Zosyn course  complete ? ?Code Status: Full code ? ? ?Subjective: Patient doing okay, complains of some lower extremity swelling ? ?Objective: ?Vital signs were reviewed and unremarkable. ?Vitals:  ? 11/13/21 0444 11/13/21 0821  ?BP: 125/69 125/72  ?Pulse: 80 88  ?Resp: 16 16  ?Temp: 98.4 ?F (36.9 ?C) 98.4 ?F (36.9 ?C)  ?SpO2: 91% 93%  ? ? ?Intake/Output Summary (Last 24 hours) at 11/13/2021 1710 ?Last data filed at 11/13/2021 1012 ?Gross per 24 hour  ?Intake 240 ml  ?Output 750 ml  ?Net -510 ml  ? ?Filed Weights  ? 11/01/21 2200  ?Weight: 68.3 kg  ? ?Body mass index is 25.06 kg/m?. ? ?Exam: ? ?General: Alert and oriented x3, no acute distress ?HEENT: Normocephalic, atraumatic, mucous membranes are moist ?Cardiovascular: Regular rate and rhythm, S1-S2 ?Respiratory: Clear to auscultation bilaterally ?Abdomen: Soft, nontender, nondistended, positive bowel sounds ?Musculoskeletal: No clubbing or cyanosis, 1-2+ pitting edema bilaterally ?Skin: No skin breaks, tears or lesions ?Psychiatry: Appropriate, no evidence of psychoses ?Neurology: No focal deficits ? ?Data Reviewed: ?Renal function back at baseline with creatinine of 1.06 and GFR 52.  Noted low albumin of 2.3.   ? ?Disposition:  ?Status is: Inpatient ?Remains inpatient appropriate because: Stabilization as confirmed by vascular surgery ?  ? ?Anticipated discharge date: 4/6 ? ?Remaining issues to be resolved so that patient can be discharged: Checking BNP.  Improvement of white blood cell count.  Stable for discharge by vascular surgery ? ? ?Family Communication: Cousin who is power of attorney ?DVT Prophylaxis: ?heparin injection 5,000 Units Start: 11/01/21 0600 ?Place TED hose Start: 10/30/21 1947 ? ? ? ?Author: ?Annita Brod ,MD ?11/13/2021 5:10 PM ? ?To reach On-call, see care teams to locate the attending and reach out via [email or web address]. ?Between 7PM-7AM, please contact night-coverage ?If you still have difficulty reaching the attending provider, please page the [facility]  (Director on Call) for [facility] on amion for assistance. ? ?

## 2021-11-13 NOTE — Assessment & Plan Note (Addendum)
Secondary to third spacing.  Patient states have been going on mildly prior to hospitalization and worse following hospitalization.  With poor p.o. intake/moderate protein calorie malnutrition, patient third spacing, even more so following IV fluids in the hospital.  This is not felt to be secondary to heart failure.  Would improve with improved nutrition.  Explained to patient and she understands.  Patient being discharged on Ensure.  In addition, she is resuming her home dose of Demadex. ?

## 2021-11-13 NOTE — Assessment & Plan Note (Addendum)
Found to have greater than 70% stenosis.  Seen by vascular surgery status post angiogram and stent placement.  Given significant stenosis, patient started on aspirin, Plavix and statin. ?

## 2021-11-13 NOTE — Assessment & Plan Note (Addendum)
When patient was complaining of lower extremity edema, echocardiogram checked and patient found to have grade 2 diastolic dysfunction.  Appears to be euvolemic.  BNP only minimally elevated.  Outpatient cardiology follow-up.  Upon discharge, patient resuming Demadex. ?

## 2021-11-13 NOTE — Progress Notes (Signed)
Nutrition Follow Up Note  ? ?DOCUMENTATION CODES:  ? ?Non-severe (moderate) malnutrition in context of social or environmental circumstances ? ?INTERVENTION:  ? ?Ensure Enlive po TID with diet advancement, each supplement provides 350 kcal and 20 grams of protein. ? ?MVI po daily  ? ?Liberalize diet  ? ?NUTRITION DIAGNOSIS:  ? ?Moderate Malnutrition related to social / environmental circumstances (advanced age, poor oral intake) as evidenced by moderate fat depletion, moderate muscle depletion ? ?GOAL:  ? ?Patient will meet greater than or equal to 90% of their needs ?-progressing  ? ?MONITOR:  ? ?PO intake, Supplement acceptance, Labs, Weight trends, Skin, I & O's ? ?ASSESSMENT:  ? ?86 y/o female with h/o MDD, anxiety, HLD, HTN, CKD III, MGUS and GERD who is admitted with e-coli colitis and found to have 70% celiac artery stenosis now s/p angiogram and stent placement 4/3. ? ?Spoke with pt today. Pt reports that her appetite and oral intake is fair; pt reports eating 50% of meals in hospital. Pt is documented to be eating 50-100% of meals. Pt reports that she drinks 3 Ensure per day. Pt's diarrhea has resolved. No new weight since admit; will request weekly weights. Recommend continue supplements after discharge. RD will liberalize pt's diet. Plan is for possible discharge tomorrow.  ? ?Medications reviewed and include: risaquad, aspirin, plavix, heparin, MVI, B12, bicarbonate, MVI, B12 ? ?Labs reviewed: K 4.5 wnl, BUN 37(H), creat 1.06(H), P 3.5 wnl, Mg 1.9 wnl  ? ?Diet Order:   ?Diet Order   ? ?       ?  Diet regular Room service appropriate? Yes; Fluid consistency: Thin  Diet effective now       ?  ? ?  ?  ? ?  ? ?EDUCATION NEEDS:  ? ?Education needs have been addressed ? ?Skin:  Skin Assessment: Reviewed RN Assessment ? ?Last BM:  4/5- type 5 ? ?Height:  ? ?Ht Readings from Last 1 Encounters:  ?11/01/21 '5\' 5"'$  (1.651 m)  ? ? ?Weight:  ? ?Wt Readings from Last 1 Encounters:  ?11/01/21 68.3 kg  ? ? ?Ideal Body  Weight:  56.8 kg ? ?Estimated Nutritional Needs:  ? ?Kcal:  1400-1600kcal/day ? ?Protein:  70-80g/day ? ?Fluid:  1.4-1.6L/day ? ?Koleen Distance MS, RD, LDN ?Please refer to AMION for RD and/or RD on-call/weekend/after hours pager ? ?

## 2021-11-14 DIAGNOSIS — I771 Stricture of artery: Secondary | ICD-10-CM | POA: Diagnosis not present

## 2021-11-14 DIAGNOSIS — K529 Noninfective gastroenteritis and colitis, unspecified: Secondary | ICD-10-CM | POA: Diagnosis not present

## 2021-11-14 DIAGNOSIS — N170 Acute kidney failure with tubular necrosis: Secondary | ICD-10-CM | POA: Diagnosis not present

## 2021-11-14 DIAGNOSIS — I5032 Chronic diastolic (congestive) heart failure: Secondary | ICD-10-CM | POA: Diagnosis not present

## 2021-11-14 LAB — PROCALCITONIN: Procalcitonin: <0.1 ng/mL

## 2021-11-14 MED ORDER — CLOPIDOGREL BISULFATE 75 MG PO TABS: 75.0000 mg | ORAL_TABLET | Freq: Every day | ORAL | 1 refills | Status: AC

## 2021-11-14 MED ORDER — ATORVASTATIN CALCIUM 10 MG PO TABS: 10.0000 mg | ORAL_TABLET | Freq: Every day | ORAL | 1 refills | Status: AC

## 2021-11-14 MED ORDER — ENSURE ENLIVE PO LIQD: 237.0000 mL | Freq: Three times a day (TID) | ORAL | 12 refills | Status: AC

## 2021-11-14 MED ORDER — POLYETHYLENE GLYCOL 3350 17 G PO PACK: 17.0000 g | PACK | Freq: Every day | ORAL | 0 refills | Status: AC | PRN

## 2021-11-14 MED ORDER — ADULT MULTIVITAMIN W/MINERALS CH: 1.0000 | ORAL_TABLET | Freq: Every day | ORAL | Status: AC

## 2021-11-14 MED ORDER — ASPIRIN 81 MG PO TBEC
81.0000 mg | DELAYED_RELEASE_TABLET | Freq: Every day | ORAL | 11 refills | Status: AC
Start: 2021-11-15 — End: ?

## 2021-11-14 NOTE — Care Management Important Message (Signed)
Important Message ? ?Patient Details  ?Name: Marilyn Sharp ?MRN: [id number] ?Date of Birth: 06/24/1936 ? ? ?Medicare Important Message Given:  Yes ? ? ? ? ?[name] ?11/14/2021, 1:35 PM ?

## 2021-11-14 NOTE — Progress Notes (Signed)
Report given to Renaee Munda, LPN at [facility]. Awaiting transportation pick up. Family at bedside.  ?

## 2021-11-14 NOTE — Discharge Summary (Addendum)
?Physician Discharge Summary ?  ?Patient: Marilyn Sharp MRN: [id number] DOB: 05-Jul-1936  ?Admit date:     10/30/2021  ?Discharge date: 11/14/21  ?Discharge Physician: Annita Brod  ? ?PCP: Idelle Crouch, MD  ? ?Recommendations at discharge:  ? ?New medication: Plavix 75 mg p.o. daily ?New medication: Aspirin 81 mg p.o. daily ?New medication: Lipitor 10 mg p.o. daily ?New medication: Multivitamin p.o. daily ?Patient being discharged to skilled nursing facility. ?Recommending patient have a CBC checked on Friday 4/8 to follow leukocytosis.  See below. ? ?Discharge Diagnoses: ?Principal Problem: ?  Celiac artery stenosis ([place]) ?Active Problems: ?  Enterotoxigenic Escherichia coli infection ?  Peripheral edema ?  Acute kidney failure, unspecified ([place]) ?  Chronic diastolic CHF (congestive heart failure) ([place]) ?  HTN (hypertension) ?  Malnutrition of moderate degree ?  GERD (gastroesophageal reflux disease) ?  Major depression in remission [facility]) ?  Leukocytosis ? ?Resolved Problems: ?  * No resolved hospital problems. * ? ?Hospital Course: ?86 year old female with past medical history of depression, anxiety, hypertension, stage IIIb chronic kidney disease and MGUS who presented to the emergency room on 3/22 with complaints of persistent colitis.  Patient has been having diarrhea on and off since September 2022 and was evaluated by GI and a CT of abdomen pelvis was done which noted colitis.  Patient given antibiotics which helped with diarrhea and abdominal pain, but then would continue intermittently.  Started having more abdominal pain as well as bright red blood per rectum and directed to the emergency room.  In the emergency room, patient found to have acute kidney injury and signs of colitis in the descending and sigmoid colon.  Patient admitted to the hospitalist service and GI consulted. ? ?Patient started on Zosyn.  Patient's stool study positive for enterotoxigenic E. coli infection.  Patient underwent  colonoscopy with findings of patchy bullous and blebs and colitis which appeared to be ischemic.  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  Patient underwent angiogram on 4/4 with stent placement for celiac artery stenosis. ? ?Assessment and Plan: ?* Celiac artery stenosis ([place]) ?Found to have greater than 70% stenosis.  Seen by vascular surgery status post angiogram and stent placement.  Given significant stenosis, patient started on aspirin, Plavix and statin. ? ?Enterotoxigenic Escherichia coli infection ?- GI and C. difficile PCR panel reviewed. CDiff is neg. Stool study is pos for ETEC.  Patient was on Zosyn.  Completed full course. ? ?Peripheral edema ?Secondary to third spacing.  Patient states have been going on mildly prior to hospitalization and worse following hospitalization.  With poor p.o. intake/moderate protein calorie malnutrition, patient third spacing, even more so following IV fluids in the hospital.  This is not felt to be secondary to heart failure.  Would improve with improved nutrition.  Explained to patient and she understands.  Patient being discharged on Ensure.  In addition, she is resuming her home dose of Demadex. ? ?Acute kidney failure, unspecified ([place]) in the setting of stage IIIa chronic kidney disease ?Renal ultrasound notes no hydronephrosis.  Patient with history of stage IIIa chronic kidney disease.  Initially present with creatinine 2.66 and GFR 17.  With fluids and resumption of p.o., creatinine looks be at baseline, currently 1.06 with GFR 52 ? ?Chronic diastolic CHF (congestive heart failure) ([employer] [place] [employer]) ?When patient was complaining of lower extremity edema, echocardiogram checked and patient found to have grade 2 diastolic dysfunction.  Appears to be  euvolemic.  BNP only minimally elevated.  Outpatient cardiology follow-up.  Upon discharge, patient resuming Demadex. ? ?HTN (hypertension) ?Initially patient on  hydralazine 25 twice daily plus Demadex.  With infection and stenosis, blood pressure is much more elevated requiring increase in hydralazine.  Pressures since then have been quite stable, so we will resume her previous home regimen. ? ?Malnutrition of moderate degree ?Nutrition Status: ?Nutrition Problem: Moderate Malnutrition ?Etiology: social / environmental circumstances (advanced age, poor oral intake) ?Signs/Symptoms: moderate fat depletion, moderate muscle depletion ?  ?Seen by nutrition.  Started on Ensure 3 times daily plus multivitamin. ? ? ? ?GERD (gastroesophageal reflux disease) ?Continue PPI ? ?Major depression in remission [facility]) ?Stable.  Not on any home medications. ? ?Leukocytosis ?Initially felt to be secondary to colitis.  Despite treatment with Zosyn, white blood cell count remained elevated.  On 4/5, up to 18.0.  Patient is afebrile and procalcitonin level completely normal, less than 10.  Not felt to be infection related.  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  Recommendation is for repeat CBC on 4/8. ? ?Benign essential HTN ?- Cont hydralazine '50mg'$  q8h ?- Hydralazine 10 mg p.o. every 6 hours as needed for SBP greater than 175 ? ? ? ? ?  ? ? ?Consultants: ?Gastroenterology ?Vascular surgery ? ?Procedures: ?Angiogram status post celiac artery stent placement ?Colonoscopy ? ?Disposition: Skilled nursing facility ? ?Diet recommendation: Regular diet plus Ensure shakes. ?Discharge Diet Orders (From admission, onward)  ? ?  Start     Ordered  ? 11/14/21 0000  Diet general       ? 11/14/21 1225  ? ?  ?  ? ?  ? ?Regular diet ?DISCHARGE MEDICATION: ?Allergies as of 11/14/2021   ? ?   Reactions  ? Alendronate   ? Other reaction(s): Other (See Comments) ?Chest pain  ? Amlodipine Besylate Other (See Comments)  ? "dizzy"  ? Ciprofloxacin Hives  ? Codeine Itching  ? Lovastatin   ? Other reaction(s): Muscle Pain  ? Other   ? Also allergic to tree nuts  and oysters.  ?Pt states she can eat other shellfish without problems  ? Septra [sulfamethoxazole-trimethoprim] Itching  ? ?  ? ?  ?Medication List  ?  ? ?STOP taking these medications   ? ?amoxicillin-clavulanate 875-125 MG tablet ?Commonly known as: Augmentin ?  ?traMADol 50 MG tablet ?Commonly known as: Ultram ?  ? ?  ? ?TAKE these medications   ? ?acetaminophen 325 MG tablet ?Commonly known as: TYLENOL ?Take 650 mg by mouth every 6 (six) hours as needed. ?  ?aspirin 81 MG EC tablet ?Take 1 tablet (81 mg total) by mouth daily. Swallow whole. ?Start taking on: November 15, 2021 ?  ?atorvastatin 10 MG tablet ?Commonly known as: LIPITOR ?Take 1 tablet (10 mg total) by mouth daily. ?Start taking on: November 15, 2021 ?  ?clopidogrel 75 MG tablet ?Commonly known as: PLAVIX ?Take 1 tablet (75 mg total) by mouth daily. ?Start taking on: November 15, 2021 ?  ?feeding supplement Liqd ?Take 237 mLs by mouth 3 (three) times daily between meals. ?  ?hydrALAZINE 25 MG tablet ?Commonly known as: APRESOLINE ?Take 25 mg by mouth 2 (two) times daily. ?  ?multivitamin with minerals Tabs tablet ?Take 1 tablet by mouth daily. ?Start taking on: November 15, 2021 ?  ?polyethylene glycol 17 g packet ?Commonly known as: MIRALAX / GLYCOLAX ?Take 17 g by mouth daily as needed for mild constipation. ?What  changed:  ?when to take this ?reasons to take this ?  ?torsemide 20 MG tablet ?Commonly known as: DEMADEX ?Take 20 mg by mouth daily. ?  ?vitamin B-12 500 MCG tablet ?Commonly known as: CYANOCOBALAMIN ?Take 500 mcg by mouth daily. ?  ?Vitamin D3 50 MCG (2000 UT) capsule ?Take by mouth daily. ?  ? ?  ? ? ?Discharge Exam: ?Filed Weights  ? 11/01/21 2200  ?Weight: 68.3 kg  ? ?General: Alert and oriented x3, no acute distress ?Cardiovascular: Regular rate and rhythm, S1-S2 ?Lungs: Clear to auscultation bilaterally ? ?Condition at discharge: good ? ?The results of significant diagnostics from this hospitalization (including imaging, microbiology, ancillary  and laboratory) are listed below for reference.  ? ?Imaging Studies: ?CT ABDOMEN PELVIS WO CONTRAST ? ?Result Date: 10/30/2021 ?CLINICAL DATA:  Abdominal pain EXAM: CT ABDOMEN AND PELVIS WITHOUT CONTRAST TECHNIQ

## 2021-11-14 NOTE — TOC Transition Note (Signed)
Transition of Care (TOC) - CM/SW Discharge Note ? ? ?Patient Details  ?Name: Marilyn Sharp ?MRN: [id number] ?Date of Birth: 07/26/1936 ? ?Transition of Care (TOC) CM/SW Contact:  ?Beverly Sessions, RN ?Phone Number: ?11/14/2021, 1:48 PM ? ? ?Clinical Narrative:    ?Patient will DC to: [facility] ?Anticipated DC date:  11/14/21  ?Family notified: Family at bed side  ?Transport by: Family  ? ?Per MD patient ready for DC to . RN, patient, patient's family, and facility notified of DC. Discharge Summary sent to facility. RN given number for report. DC packet on chart. TOC signing off. ? ?Isaias Cowman RNCM ?[phone] ? ? ? ?  ?Barriers to Discharge: Continued Medical Work up ? ? ?Patient Goals and CMS Choice ?  ?  ?  ? ?Discharge Placement ?  ?           ?  ?  ?  ?  ? ?Discharge Plan and Services ?  ?  ?           ?  ?  ?  ?  ?  ?  ?  ?  ?  ?  ? ?Social Determinants of Health (SDOH) Interventions ?  ? ? ?Readmission Risk Interventions ?   ? View : No data to display.  ?  ?  ?  ? ? ? ? ? ?

## 2022-01-09 DEATH — deceased

## 2022-01-13 ENCOUNTER — Inpatient Hospital Stay: Payer: PRIVATE HEALTH INSURANCE

## 2022-01-20 ENCOUNTER — Inpatient Hospital Stay: Payer: PRIVATE HEALTH INSURANCE | Admitting: Nurse Practitioner
